# Patient Record
Sex: Female | Born: 1940
Health system: Southern US, Community
[De-identification: ages and names within clinical notes are randomized; demographics above are authoritative.]

## PROBLEM LIST (undated history)

## (undated) DIAGNOSIS — L4050 Arthropathic psoriasis, unspecified: Secondary | ICD-10-CM

## (undated) DIAGNOSIS — I5181 Takotsubo syndrome: Secondary | ICD-10-CM

## (undated) DIAGNOSIS — M51369 Other intervertebral disc degeneration, lumbar region without mention of lumbar back pain or lower extremity pain: Secondary | ICD-10-CM

## (undated) DIAGNOSIS — F419 Anxiety disorder, unspecified: Secondary | ICD-10-CM

## (undated) DIAGNOSIS — I10 Essential (primary) hypertension: Secondary | ICD-10-CM

## (undated) DIAGNOSIS — E785 Hyperlipidemia, unspecified: Secondary | ICD-10-CM

## (undated) DIAGNOSIS — F329 Major depressive disorder, single episode, unspecified: Secondary | ICD-10-CM

## (undated) DIAGNOSIS — I2510 Atherosclerotic heart disease of native coronary artery without angina pectoris: Secondary | ICD-10-CM

## (undated) DIAGNOSIS — N200 Calculus of kidney: Secondary | ICD-10-CM

## (undated) DIAGNOSIS — R7303 Prediabetes: Secondary | ICD-10-CM

## (undated) DIAGNOSIS — F32A Depression, unspecified: Secondary | ICD-10-CM

## (undated) DIAGNOSIS — F0280 Dementia in other diseases classified elsewhere without behavioral disturbance: Secondary | ICD-10-CM

## (undated) DIAGNOSIS — E119 Type 2 diabetes mellitus without complications: Secondary | ICD-10-CM

## (undated) DIAGNOSIS — M5136 Other intervertebral disc degeneration, lumbar region: Secondary | ICD-10-CM

## (undated) HISTORY — PX: APPENDECTOMY: SHX54

## (undated) HISTORY — DX: Essential (primary) hypertension: I10

## (undated) HISTORY — DX: Takotsubo syndrome: I51.81

## (undated) HISTORY — DX: Prediabetes: R73.03

## (undated) HISTORY — PX: CHOLECYSTECTOMY: SHX55

## (undated) HISTORY — DX: Calculus of kidney: N20.0

## (undated) HISTORY — DX: Atherosclerotic heart disease of native coronary artery without angina pectoris: I25.10

---

## 2002-10-15 ENCOUNTER — Ambulatory Visit (HOSPITAL_COMMUNITY): Admission: RE | Admit: 2002-10-15 | Discharge: 2002-10-15 | Payer: Self-pay | Admitting: Internal Medicine

## 2004-12-08 ENCOUNTER — Encounter: Admission: RE | Admit: 2004-12-08 | Discharge: 2004-12-08 | Payer: Self-pay | Admitting: Urology

## 2004-12-09 ENCOUNTER — Ambulatory Visit (HOSPITAL_BASED_OUTPATIENT_CLINIC_OR_DEPARTMENT_OTHER): Admission: RE | Admit: 2004-12-09 | Discharge: 2004-12-09 | Payer: Self-pay | Admitting: Urology

## 2004-12-09 ENCOUNTER — Ambulatory Visit (HOSPITAL_COMMUNITY): Admission: RE | Admit: 2004-12-09 | Discharge: 2004-12-09 | Payer: Self-pay | Admitting: Urology

## 2010-07-28 ENCOUNTER — Other Ambulatory Visit: Payer: Self-pay | Admitting: Endocrinology

## 2010-07-28 DIAGNOSIS — E042 Nontoxic multinodular goiter: Secondary | ICD-10-CM

## 2010-08-10 ENCOUNTER — Other Ambulatory Visit: Payer: Self-pay

## 2010-08-19 ENCOUNTER — Other Ambulatory Visit: Payer: Self-pay | Admitting: Endocrinology

## 2010-08-19 DIAGNOSIS — E041 Nontoxic single thyroid nodule: Secondary | ICD-10-CM

## 2010-08-26 ENCOUNTER — Other Ambulatory Visit: Payer: Self-pay | Admitting: Interventional Radiology

## 2010-08-26 ENCOUNTER — Ambulatory Visit
Admission: RE | Admit: 2010-08-26 | Discharge: 2010-08-26 | Disposition: A | Discharge status: Home / Self Care | Payer: Medicare Other | Source: Ambulatory Visit | Attending: Endocrinology | Admitting: Endocrinology

## 2010-08-26 ENCOUNTER — Other Ambulatory Visit (HOSPITAL_COMMUNITY)
Admission: RE | Admit: 2010-08-26 | Discharge: 2010-08-26 | Disposition: A | Discharge status: Home / Self Care | Payer: Medicare Other | Source: Ambulatory Visit | Attending: Interventional Radiology | Admitting: Interventional Radiology

## 2010-08-26 DIAGNOSIS — E049 Nontoxic goiter, unspecified: Secondary | ICD-10-CM | POA: Insufficient documentation

## 2010-08-26 DIAGNOSIS — E041 Nontoxic single thyroid nodule: Secondary | ICD-10-CM

## 2011-05-19 ENCOUNTER — Encounter (INDEPENDENT_AMBULATORY_CARE_PROVIDER_SITE_OTHER): Payer: Medicare Other | Admitting: Ophthalmology

## 2011-05-19 DIAGNOSIS — H35039 Hypertensive retinopathy, unspecified eye: Secondary | ICD-10-CM

## 2011-05-19 DIAGNOSIS — H33309 Unspecified retinal break, unspecified eye: Secondary | ICD-10-CM

## 2011-05-19 DIAGNOSIS — H35379 Puckering of macula, unspecified eye: Secondary | ICD-10-CM

## 2011-05-19 DIAGNOSIS — I10 Essential (primary) hypertension: Secondary | ICD-10-CM

## 2012-06-21 HISTORY — PX: BACK SURGERY: SHX140

## 2012-12-16 ENCOUNTER — Inpatient Hospital Stay (HOSPITAL_COMMUNITY)
Admission: AD | Admit: 2012-12-16 | Discharge: 2012-12-19 | DRG: 281 | Disposition: A | Discharge status: Home / Self Care | Payer: Medicare Other | Source: Other Acute Inpatient Hospital | Attending: Internal Medicine | Admitting: Internal Medicine

## 2012-12-16 ENCOUNTER — Encounter (HOSPITAL_COMMUNITY): Payer: Self-pay | Admitting: *Deleted

## 2012-12-16 DIAGNOSIS — M5137 Other intervertebral disc degeneration, lumbosacral region: Secondary | ICD-10-CM | POA: Diagnosis present

## 2012-12-16 DIAGNOSIS — Z79899 Other long term (current) drug therapy: Secondary | ICD-10-CM

## 2012-12-16 DIAGNOSIS — F3289 Other specified depressive episodes: Secondary | ICD-10-CM | POA: Diagnosis present

## 2012-12-16 DIAGNOSIS — F411 Generalized anxiety disorder: Secondary | ICD-10-CM | POA: Diagnosis present

## 2012-12-16 DIAGNOSIS — F329 Major depressive disorder, single episode, unspecified: Secondary | ICD-10-CM | POA: Diagnosis present

## 2012-12-16 DIAGNOSIS — M51379 Other intervertebral disc degeneration, lumbosacral region without mention of lumbar back pain or lower extremity pain: Secondary | ICD-10-CM | POA: Diagnosis present

## 2012-12-16 DIAGNOSIS — I2510 Atherosclerotic heart disease of native coronary artery without angina pectoris: Secondary | ICD-10-CM | POA: Diagnosis present

## 2012-12-16 DIAGNOSIS — M5136 Other intervertebral disc degeneration, lumbar region: Secondary | ICD-10-CM | POA: Diagnosis present

## 2012-12-16 DIAGNOSIS — I509 Heart failure, unspecified: Secondary | ICD-10-CM | POA: Diagnosis present

## 2012-12-16 DIAGNOSIS — N200 Calculus of kidney: Secondary | ICD-10-CM | POA: Diagnosis present

## 2012-12-16 DIAGNOSIS — I10 Essential (primary) hypertension: Secondary | ICD-10-CM | POA: Diagnosis present

## 2012-12-16 DIAGNOSIS — N1330 Unspecified hydronephrosis: Secondary | ICD-10-CM

## 2012-12-16 DIAGNOSIS — I498 Other specified cardiac arrhythmias: Secondary | ICD-10-CM | POA: Diagnosis present

## 2012-12-16 DIAGNOSIS — R0902 Hypoxemia: Secondary | ICD-10-CM | POA: Diagnosis present

## 2012-12-16 DIAGNOSIS — E785 Hyperlipidemia, unspecified: Secondary | ICD-10-CM | POA: Diagnosis present

## 2012-12-16 DIAGNOSIS — I5020 Unspecified systolic (congestive) heart failure: Secondary | ICD-10-CM | POA: Diagnosis present

## 2012-12-16 DIAGNOSIS — R7989 Other specified abnormal findings of blood chemistry: Secondary | ICD-10-CM

## 2012-12-16 DIAGNOSIS — I5181 Takotsubo syndrome: Secondary | ICD-10-CM | POA: Diagnosis present

## 2012-12-16 DIAGNOSIS — E119 Type 2 diabetes mellitus without complications: Secondary | ICD-10-CM | POA: Diagnosis present

## 2012-12-16 DIAGNOSIS — L4050 Arthropathic psoriasis, unspecified: Secondary | ICD-10-CM | POA: Diagnosis present

## 2012-12-16 DIAGNOSIS — I249 Acute ischemic heart disease, unspecified: Secondary | ICD-10-CM | POA: Diagnosis present

## 2012-12-16 DIAGNOSIS — I214 Non-ST elevation (NSTEMI) myocardial infarction: Principal | ICD-10-CM | POA: Diagnosis present

## 2012-12-16 DIAGNOSIS — I200 Unstable angina: Secondary | ICD-10-CM

## 2012-12-16 HISTORY — DX: Depression, unspecified: F32.A

## 2012-12-16 HISTORY — DX: Major depressive disorder, single episode, unspecified: F32.9

## 2012-12-16 HISTORY — DX: Type 2 diabetes mellitus without complications: E11.9

## 2012-12-16 HISTORY — DX: Anxiety disorder, unspecified: F41.9

## 2012-12-16 HISTORY — DX: Arthropathic psoriasis, unspecified: L40.50

## 2012-12-16 HISTORY — DX: Other intervertebral disc degeneration, lumbar region without mention of lumbar back pain or lower extremity pain: M51.369

## 2012-12-16 HISTORY — DX: Other intervertebral disc degeneration, lumbar region: M51.36

## 2012-12-16 HISTORY — DX: Hyperlipidemia, unspecified: E78.5

## 2012-12-16 LAB — LIPID PANEL
HDL: 57 mg/dL (ref >39)
LDL Cholesterol: 88 mg/dL (ref 0–99)
Total CHOL/HDL Ratio: 2.7 RATIO
Triglycerides: 34 mg/dL (ref <150)

## 2012-12-16 LAB — BASIC METABOLIC PANEL
BUN: 14 mg/dL (ref 6–23)
CO2: 35 mEq/L — ABNORMAL HIGH (ref 19–32)
Chloride: 106 mEq/L (ref 96–112)
GFR calc Af Amer: >90 mL/min (ref >90)
GFR calc non Af Amer: 84 mL/min — ABNORMAL LOW (ref >90)
Glucose, Bld: 104 mg/dL — ABNORMAL HIGH (ref 70–99)
Potassium: 3.9 mEq/L (ref 3.5–5.1)
Sodium: 144 mEq/L (ref 135–145)

## 2012-12-16 LAB — CBC
MCHC: 34.3 g/dL (ref 30.0–36.0)
WBC: 8.1 10*3/uL (ref 4.0–10.5)

## 2012-12-16 LAB — MRSA PCR SCREENING: MRSA by PCR: NEGATIVE

## 2012-12-16 LAB — TROPONIN I: Troponin I: 1.12 ng/mL (ref <0.30)

## 2012-12-16 MED ORDER — FENTANYL CITRATE 0.05 MG/ML IJ SOLN
12.5000 ug | INTRAMUSCULAR | Status: DC | PRN
  Administered 2012-12-16: 25 ug via INTRAVENOUS
  Administered 2012-12-16: 16:00:00 via INTRAVENOUS
  Administered 2012-12-16: 12.5 ug via INTRAVENOUS
  Filled 2012-12-16 (×3): qty 2

## 2012-12-16 MED ORDER — HYDROMORPHONE HCL PF 1 MG/ML IJ SOLN
0.5000 mg | INTRAMUSCULAR | Status: DC | PRN
  Filled 2012-12-16: qty 1

## 2012-12-16 MED ORDER — ACETAMINOPHEN 650 MG RE SUPP: 650.0000 mg | Freq: Four times a day (QID) | RECTAL | Status: DC | PRN

## 2012-12-16 MED ORDER — ASPIRIN 325 MG PO TABS
325.0000 mg | ORAL_TABLET | Freq: Every day | ORAL | Status: DC
  Administered 2012-12-17 – 2012-12-19 (×2): 325 mg via ORAL
  Filled 2012-12-16 (×3): qty 1

## 2012-12-16 MED ORDER — SODIUM CHLORIDE 0.9 % IV SOLN
INTRAVENOUS | Status: DC
  Administered 2012-12-16 – 2012-12-18 (×5): via INTRAVENOUS

## 2012-12-16 MED ORDER — HEPARIN BOLUS VIA INFUSION
3000.0000 [IU] | Freq: Once | INTRAVENOUS | Status: AC
  Administered 2012-12-16: 3000 [IU] via INTRAVENOUS
  Filled 2012-12-16: qty 3000

## 2012-12-16 MED ORDER — PAROXETINE HCL 20 MG PO TABS
20.0000 mg | ORAL_TABLET | Freq: Every day | ORAL | Status: DC
  Administered 2012-12-17 – 2012-12-19 (×3): 20 mg via ORAL
  Filled 2012-12-16 (×4): qty 1

## 2012-12-16 MED ORDER — TAMSULOSIN HCL 0.4 MG PO CAPS
0.4000 mg | ORAL_CAPSULE | Freq: Every day | ORAL | Status: DC
  Administered 2012-12-16 – 2012-12-18 (×3): 0.4 mg via ORAL
  Filled 2012-12-16 (×5): qty 1

## 2012-12-16 MED ORDER — ACETAMINOPHEN 325 MG PO TABS: 650.0000 mg | ORAL_TABLET | Freq: Four times a day (QID) | ORAL | Status: DC | PRN

## 2012-12-16 MED ORDER — INSULIN ASPART 100 UNIT/ML ~~LOC~~ SOLN: 0.0000 [IU] | SUBCUTANEOUS | Status: DC

## 2012-12-16 MED ORDER — BIOTENE DRY MOUTH MT LIQD
15.0000 mL | Freq: Two times a day (BID) | OROMUCOSAL | Status: DC
  Administered 2012-12-16 – 2012-12-19 (×4): 15 mL via OROMUCOSAL

## 2012-12-16 MED ORDER — ASPIRIN 81 MG PO CHEW
324.0000 mg | CHEWABLE_TABLET | Freq: Once | ORAL | Status: AC
  Administered 2012-12-16: 324 mg via ORAL
  Filled 2012-12-16: qty 3
  Filled 2012-12-16: qty 1

## 2012-12-16 MED ORDER — KETOROLAC TROMETHAMINE 15 MG/ML IJ SOLN
15.0000 mg | Freq: Four times a day (QID) | INTRAMUSCULAR | Status: DC | PRN
  Administered 2012-12-16: 15 mg via INTRAVENOUS
  Filled 2012-12-16: qty 1

## 2012-12-16 MED ORDER — NITROGLYCERIN IN D5W 200-5 MCG/ML-% IV SOLN
3.0000 ug/min | INTRAVENOUS | Status: DC
  Administered 2012-12-16: 5 ug/min via INTRAVENOUS
  Filled 2012-12-16: qty 250

## 2012-12-16 MED ORDER — HEPARIN (PORCINE) IN NACL 100-0.45 UNIT/ML-% IJ SOLN
700.0000 [IU]/h | INTRAMUSCULAR | Status: DC
  Administered 2012-12-16: 700 [IU]/h via INTRAVENOUS
  Filled 2012-12-16 (×3): qty 250

## 2012-12-16 NOTE — Progress Notes (Signed)
[facility] Progress Note [place] TEAM 1 - Stepdown/ICU TEAM   Jennifer [name] GNF:[id number] DOB: Nov 08, 1940 DOA: 12/16/2012 PCP: Kirstie Peri, MD  Brief narrative: 72 y.o. female who presented to the [facility] ED due to severe sharp intermittent left flank and left lower ABD pain that was worsening over 1 week. She has a history of kidneys stones and thought that she may have had a stone. While in the ED she was being evaluated and became transiently unresponsive, bradycardic, cyanotic and hypoxic.  Cardiac enzymes and an EKG were performed and the troponin was found to be elevated at 1.35. Patient denied chest pain, SOB, nausea/vomiting, or diaphoresis. A CT of the ABD was performed as well as a renal US which found obstruction and hydronephrosis of the L ureter, and a right non-obstructing renal calculus as well. [facility] was consulted and arrangements were made to transfer the patient to [facility] for Cardiology and Urology evaluations.   Assessment/Plan:  NSTEMI  [facility] Cards consulted  Mild hydronephrosis of left kidney w/ neprholithiasis  crt 0.64 at [facility] - UA remarkable for hematuria only - CT report from [facility] 6/27 notes "mild left hydronephrosis and moderate left hydroureter associated with a 0.6 x 0.4 x 0.3 com L UVJ calculus"  Type II diabetes mellitus  Diet controlled  Hyperlipidemia Lipid panel   DDD lumbar spine  Psoriatic arthritis   Code Status: FULL Family Communication: spoke w/ husband and son at bedside Disposition Plan: SDU  Consultants: Cor[facility] Cards  Procedures: none  Antibiotics: Rocephin 6/28 >>  DVT prophylaxis: IV heparin  HPI/Subjective: Pt seen for f/u visit    Objective: Blood pressure 94/50, pulse 62, temperature 98.5 F (36.9 C), temperature source Oral, resp. rate 15, height 5' (1.524 m), weight 57.1 kg (125 lb 14.1 oz), SpO2 97.00%.  Intake/Output Summary (Last 24 hours) at 12/16/12  0921 Last data filed at 12/16/12 0600  Gross per 24 hour  Intake  62.37 ml  Output      0 ml  Net  62.37 ml    Exam: F/U exam completed  Data Reviewed: Basic Metabolic Panel:  Recent Labs Lab 12/16/12 0520  NA 144  K 3.9  CL 106  CO2 35*  GLUCOSE 104*  BUN 14  CREATININE 0.72  CALCIUM 8.7   Liver Function Tests: No results found for this basename: AST, ALT, ALKPHOS, BILITOT, PROT, ALBUMIN,  in the last 168 hours No results found for this basename: LIPASE, AMYLASE,  in the last 168 hours No results found for this basename: AMMONIA,  in the last 168 hours CBC:  Recent Labs Lab 12/16/12 0520  WBC 8.1  HGB 12.2  HCT 35.6*  MCV 94.2  PLT 167   Cardiac Enzymes:  Recent Labs Lab 12/16/12 0520  TROPONINI 2.67*   CBG: No results found for this basename: GLUCAP,  in the last 168 hours  Recent Results (from the past 240 hour(s))  MRSA PCR SCREENING     Status: None   Collection Time    12/16/12  4:12 AM      Result Value Range Status   MRSA by PCR NEGATIVE  NEGATIVE Final   Comment:            The GeneXpert MRSA Assay (FDA     approved for NASAL specimens     only), is one component of a     comprehensive MRSA colonization     surveillance program. It is not  intended to diagnose MRSA     infection nor to guide or     monitor treatment for     MRSA infections.     Studies:  Recent x-ray studies have been reviewed in detail by the Attending Physician  Scheduled Meds:  Scheduled Meds: . antiseptic oral rinse  15 mL Mouth Rinse BID   Continuous Infusions: . sodium chloride 75 mL/hr at 12/16/12 0526  . heparin 700 Units/hr (12/16/12 0317)  . nitroGLYCERIN 5 mcg/min (12/16/12 0526)    Time spent on care of this patient: 25+ mins   [facility] T  [facility] Office  [phone] Pager - Text Page per Loretha Stapler as per below:  On-Call/Text Page:      [email or web address]      password [email or web address]  If 7PM-7AM, please contact  night-coverage [email or web address] Password [email or web address] 12/16/2012, 9:21 AM   LOS: 0 days

## 2012-12-16 NOTE — Progress Notes (Signed)
ANTICOAGULATION CONSULT NOTE - Initial Consult  Pharmacy Consult for Heparin Indication: chest pain/ACS  Not on File  Patient Measurements: Height: 5' (152.4 cm) Weight: 125 lb 14.1 oz (57.1 kg) IBW/kg (Calculated) : 45.5  Vital Signs: Temp: 97.1 F (36.2 C) (06/28 0100) Temp src: Oral (06/28 0100)  Labs (at [facility]):  WBC 11.4 Hgb 12.7 Hct 38.1 Plt 173  SCr 0.64  Troponin  1.35  No results found for this basename: HGB, HCT, PLT, APTT, LABPROT, INR, HEPARINUNFRC, CREATININE, CKTOTAL, CKMB, TROPONINI,  in the last 72 hours  CrCl is unknown because no creatinine reading has been taken.   Medical History: Past Medical History  Diagnosis Date  . Dysrhythmia   . Anxiety     Medications:  Hyzaar  Cardura  Vicodin  Fish Oil  MVI  Paxil  Klonopin    Assessment: 72 yo female with elevated cardiac markers for heparin  Goal of Therapy:  Heparin level 0.3-0.7 units/ml Monitor platelets by anticoagulation protocol: Yes   Plan:  Heparin 3000 units IV bolus, then 700 units/hr Check heparin level in 6 hours.  Rossy Virag, Gary Fleet 12/16/2012,2:38 AM

## 2012-12-16 NOTE — H&P (Addendum)
[facility] History and Physical  Jennifer [name]:[id number] DOB: 05/03/1941 DOA: 12/16/2012  Referring physician:  EDP at [facility] ED Dr. Franchot Erichsen PCP: Kirstie Peri, MD  Specialists: Cardiology:   Jacqulyn Ducking  Chief Complaint: Left Flank and lower ABD Pain  HPI: Jennifer [name] is a 72 y.o. female who presented to the [facility] ED due to severe sharp intermittent left flank and Left lower ABD pain that was worsening over 1 week.  She has a history of kidneys stones and thought that she may have had a stone.  While in the ED she was being evaluated and became unresponsive and bradycardic, cyanotic and hypoxic, transiently so cardiac enzymes and an EKG  were performed and the troponin was found to be elevated at ).6 then 1.2.  Patient denied having any chest pain, SOB, or nausea vomiting or diaphoresis.   [facility] was consulted and arrangements were made to transfer the patient to [facility] for Cardiology and Urology Evaluations.    A ct scan of the ABD was performed as well as a Renal Ultrasound which found obstruction and hydronephrosis of the Left Ureter, and a right non-Obstructing renal calculus as well.      Review of Systems: The patient denies anorexia, fever, chills, headaches, weight loss, vision loss, diplopia, dizziness, decreased hearing, rhinitis, hoarseness, chest pain, syncope, dyspnea on exertion, peripheral edema, balance deficits, cough, hemoptysis, nausea, vomiting, diarrhea, constipation, hematemesis, melena, hematochezia, severe indigestion/heartburn, dysuria, hematuria, incontinence, muscle weakness, suspicious skin lesions, transient blindness, difficulty walking, depression, unusual weight change, abnormal bleeding, enlarged lymph nodes, angioedema, and breast masses.    Past Medical History  Diagnosis Date  . Dysrhythmia   . Anxiety   . Diabetes mellitus     Borderline  . Hyperlipidemia   . Depression   . Anxiety    . DDD (degenerative disc disease), lumbar   . Psoriatic arthritis   . Kidney stones         HTN   Past Surgical History  Procedure Laterality Date  . Appendectomy    . Cholecystectomy      Prior to Admission medications   Not on File    Losartan  Doxazosin  Hydrocodone/APAP  Omega 3 Fatty Acids  MVI  Paroxetine  Clanazepam    Allergies:     NKDA    Social History:  has no tobacco, alcohol, and drug history on file.     Family History  Problem Relation Age of Onset  . CAD Father   . CAD Mother   . Hypertension Mother   . Hypertension Brother      X 2  . Breast cancer Mother        Physical Exam:  GEN:  Pleasant Elderly Obese Caucasian  72 y.o. female  examined  and in no acute distress; cooperative with exam Filed Vitals:   12/16/12 0100  Temp: 97.1 F (36.2 C)  TempSrc: Oral  Height: 5' (1.524 m)  Weight: 57.1 kg (125 lb 14.1 oz)   Temperature 97.1 F (36.2 C), temperature source Oral, height 5' (1.524 m), weight 57.1 kg (125 lb 14.1 oz). PSYCH: She is alert and oriented x4; does not appear anxious does not appear depressed; affect is normal HEENT: Normocephalic and Atraumatic, Mucous membranes pink; PERRLA; EOM intact; Fundi:  Benign;  No scleral icterus, Nares: Patent, Oropharynx: Clear, Fair Dentition, Neck:  FROM, no cervical lymphadenopathy nor thyromegaly or carotid bruit; no JVD; Breasts:: Not examined CHEST WALL: No tenderness CHEST:  Normal respiration, clear to auscultation bilaterally HEART: Regular rate and rhythm; no murmurs rubs or gallops BACK: No kyphosis or scoliosis; no CVA tenderness ABDOMEN: Positive Bowel Sounds, Obese, soft non-tender; no masses, no organomegaly, no pannus; no intertriginous candida. Rectal Exam: Not done EXTREMITIES: No cyanosis, clubbing or edema; no ulcerations. Genitalia: not examined PULSES: 2+ and symmetric SKIN: Normal hydration no rash or ulceration CNS: Cranial nerves 2-12 grossly intact no focal  neurologic deficit    Labs on Admission:  Basic Metabolic Panel: No results found for this basename: NA, K, CL, CO2, GLUCOSE, BUN, CREATININE, CALCIUM, MG, PHOS,  in the last 168 hours Liver Function Tests: No results found for this basename: AST, ALT, ALKPHOS, BILITOT, PROT, ALBUMIN,  in the last 168 hours No results found for this basename: LIPASE, AMYLASE,  in the last 168 hours No results found for this basename: AMMONIA,  in the last 168 hours CBC: No results found for this basename: WBC, NEUTROABS, HGB, HCT, MCV, PLT,  in the last 168 hours Cardiac Enzymes: No results found for this basename: CKTOTAL, CKMB, CKMBINDEX, TROPONINI,  in the last 168 hours  BNP (last 3 results) No results found for this basename: PROBNP,  in the last 8760 hours CBG: No results found for this basename: GLUCAP,  in the last 168 hours  Radiological Exams on Admission: No results found.   EKG: Independently reviewed.  Normal Sinus Rhythm, with T-wave inversion in Antero-Lateral leads   Assessment/Plan Principal Problem:   Acute coronary syndrome Active Problems:   NSTEMI (non-ST elevated myocardial infarction)   Hydronephrosis of left kidney   Nephrolithiasis   Type II or unspecified type diabetes mellitus without mention of complication, not stated as uncontrolled   Other and unspecified hyperlipidemia   DDD (degenerative disc disease), lumbar   Psoriatic arthritis    1.   ACS/NSTEMI-  Admitted to Stepdown Bed for Cardiac Monitoring, IV Nitro glycerin, and IV Heparin drip, Full Dose ASA therapy, and cycle Troponins.  Cardiology consulted and Evaluating at this time.   See Consult Note.  NPO in event of AM Cardiac Catherization.    2.   Hydronephrosis-  Due to Obstructing Renal Calculus, Consult Urology to see in AM 06/28 for intervention to remove calculus and obstruction.    3.   Nephrolithiasis- Unclear etiology, Calcific versus Uric Acid,   Defer to Urology evaluation for cause.  Pain  control PRN and hydration   4.   DM2- diet controlled, check HbA1C and SSI coverage added for elevated glucose levels PRN.    5.   Hyperlipidemia- Continue  Omega3 Fatty Acids  6.   DDD of Lumbar Spine with Chronic Pain- Pain Control PRN, plan for Lumbar Diskectomy and Fusion in August 2014.    7.   Psoriatic Arthritis- stable on meds, takes q 12 weekly injections of  Humira       .     8. HTN- stable on Losartan and Cardura.              Code Status:   FULL CODE Family Communication:    Son and Brother at Bedside Disposition Plan:    Return to Home on Discharge Time spent: 50 Minutes  Ron Parker [facility] Pager [phone]  If 7PM-7AM, please contact night-coverage [email or web address] Password [email or web address] 12/16/2012, 3:00 AM

## 2012-12-16 NOTE — Consult Note (Signed)
CARDIOLOGY CONSULT NOTE  Patient ID: Jennifer [name], MRN: [id number], DOB/AGE: May 22, 1941 72 y.o. Admit date: 12/16/2012 Date of Consult: 12/16/2012  Primary Physician: Kirstie Peri, MD Primary Cardiologist: Gentry Fitz  Chief Complaint: abdominal pain Reason for Consultation: +troponin in the setting of passing kidney stone  HPI: 72 y.o. female w/ PMHx significant for HTN, kidney stones who initially presented to [facility] hospital with kidney stone symptoms. Workup in the ER demonstrated an obstructing stone and per the family, outpatient monitoring was planned. However, while in the ER, nursing notes indicate that she had an episode of bradycardia, diaphoresis and reduced responsiveness. Lips turned blue. Patient does not recall any of this. This prompted further workup including drawing a troponin which was positive.  Pt is currently chest pain free and without symptoms. She reports that she is able to perform her activities of daily living without symptoms (grocery shopping etc) however she is limited by her chronic lower back pain. No cardiac history previously. No stress test in the past. No LE edema, orthopnea, PND.  EKG: sinus, LVH, PRWP, septal qs  Received aspirin (81), flomax, zofran, morphine, toradol in the ER.  CT: Mild left hydro and mild left hydroureter 6 x 4 x 3 mm UVJ calculus Atherosclerosis of aorto-iliacs  Xray: Borderline cardiomegaly athero of arch  Labs: UA +blood Cr 0.6 Glucose 113 Ca 8.2 Albumin 3.4 Normal LFTs K 3.2 Troponin 1.35 CPK 88 MB 7.3. IRN 1 LIpase 25 Plts 173  PMH: 1. Kidney stones, multiple 2. HTN 3. Chronic lower back pain  Surgical History:  1. Cholecystectomy 2. Appendectomy  Home Meds: Prior to Admission medications   Not on File  Losartan 50 hctz 25 Doxazosin 8 mg vicodin prn Fish oil Vitamins Paroxetine 40 Xanax prn  Inpatient Medications:    Allergies: NKDA  History   Social History  . Marital Status:  Single    Spouse Name: N/A    Number of Children: N/A  . Years of Education: N/A   Occupational History  . Not on file.   Social History Main Topics  . Smoking status: Not on file  . Smokeless tobacco: Not on file  . Alcohol Use: Not on file  . Drug Use: Not on file  . Sexually Active: Not on file   Other Topics Concern  . Not on file   Social History Narrative  . No narrative on file    FH: M and F with MIs in 60s   Review of Systems: General: negative for chills, fever, night sweats or weight changes.  Cardiovascular: see HPI Dermatological: negative for rash Respiratory: negative for cough or wheezing Urologic: negative for hematuria Abdominal: negative for nausea, vomiting, diarrhea, bright red blood per rectum, melena, or hematemesis Neurologic: negative for visual changes, syncope, or dizziness All other systems reviewed and are otherwise negative except as noted above.  Labs: No results found for this basename: CKTOTAL, CKMB, TROPONINI,  in the last 72 hours No results found for this basename: WBC, HGB, HCT, MCV, PLT   No results found for this basename: NA, K, CL, CO2, BUN, CREATININE, CALCIUM, LABALBU, PROT, BILITOT, ALKPHOS, ALT, AST, GLUCOSE,  in the last 168 hours No results found for this basename: CHOL, HDL, LDLCALC, TRIG   No results found for this basename: DDIMER    Radiology/Studies:  No results found.  EKG: see HPI  Physical Exam: There were no vitals taken for this visit. General: Well developed, well nourished, in no acute distress. Head: Normocephalic, atraumatic, sclera non-icteric,  no xanthomas, nares are without discharge.  Neck: Supple. Negative for carotid bruits. JVD not elevated. Lungs: Clear bilaterally to auscultation without wheezes, rales, or rhonchi. Breathing is unlabored. Heart: RRR with S1 S2. No murmurs, rubs, or gallops appreciated. Abdomen: Soft, non-tender, non-distended with normoactive bowel sounds. No hepatomegaly. No  rebound/guarding. No obvious abdominal masses. Msk:  Strength and tone appear normal for age. Extremities: No clubbing or cyanosis. No edema.  Distal pedal pulses are 2+ and equal bilaterally. Neuro: Alert and oriented X 3. Moves all extremities spontaneously. Psych:  Responds to questions appropriately with a normal affect.   Problem List 1. Syncopal event? Details unclear 2. Elevated troponin 3. Atherosclerosis by CT and chest xray 4. LVH by EKG 5. HTN 6. Obstructing kidney stone  Assessment and Plan:  72 y.o. female w/ PMHx significant for HTN, kidney stones who initially presented to [facility] hospital with kidney stone symptoms but suffered some sort of syncope/unresponsive/bradycardic event while in the ER resulting in an elevated troponin.   Elevated troponin could be due to several etiologies including "typical ACS" of atheroembolic coronary disease, supply demand mismatch from vagal hypotensive event, or takotsubos cardiomyopathy. Risk factors include long standing hypertension, LVH on EKG, and atherosclerotic disease seen on abdominal CT.  Currently chest pain free and comfortable.  At this time, treatment for ACS is most prudent with aspirin, heparin, blood pressure control and statin. Serial troponins and telemetry. Continue ARB and HCTZ. Doxazosin doesn;t have much cardio-protective effect and would consider stopping this medication in favor of others unless it is needed for kidney stones.  Echo ordered to evaluate LVH as well as for wall motion abnormalities.  Would maintain NPO in case cath is planned in the AM. Factors that would influence urgency of cath decision include recurrent symptoms, significantly elevated troponin, wall motion abnormalities on echo.  Summary of Recommendations: 1. Serial troponins 2. Heparin gtt (ordered), aspirin 325 x 1 now (ordered), 3. Aspirin 81 mg, continue home losartan/hctz. Ambilvalent about doxazosin (see above) 4. NPO for possible  procedure 5. Echo (ordered)  Thank you for this consult. [facility] will follow up in the AM. Please call with questions.  Signed, Jahden Schara C. MD 12/16/2012, 1:02 AM

## 2012-12-16 NOTE — Progress Notes (Signed)
Notified Dr. Adolm Joseph of critical trop 2.67. No new orders at this time.

## 2012-12-16 NOTE — Progress Notes (Addendum)
ANTICOAGULATION CONSULT NOTE - Follow Up  Pharmacy Consult for Heparin Indication: chest pain/ACS  Not on File  Patient Measurements: Height: 5' (152.4 cm) Weight: 125 lb 14.1 oz (57.1 kg) IBW/kg (Calculated) : 45.5  Vital Signs: Temp: 98.5 F (36.9 C) (06/28 0759) Temp src: Oral (06/28 0759) BP: 96/46 mmHg (06/28 0930) Pulse Rate: 69 (06/28 0930)  Labs (at [facility]):  WBC 11.4 Hgb 12.7 Hct 38.1 Plt 173  SCr 0.64  Troponin  1.35 > 2.67(here) now 1.79   Recent Labs  12/16/12 0520 12/16/12 0912  HGB 12.2  --   HCT 35.6*  --   PLT 167  --   HEPARINUNFRC  --  0.59  CREATININE 0.72  --   TROPONINI 2.67* 1.79*   Estimated Creatinine Clearance: 50.3 ml/min (by C-G formula based on Cr of 0.72).  Medical History: Past Medical History  Diagnosis Date  . Anxiety   . Diabetes mellitus     Borderline  . Hyperlipidemia   . Anxiety   . DDD (degenerative disc disease), lumbar   . Psoriatic arthritis   . Kidney stones   . Hypertension   . Depression    Medications:  Hyzaar  Cardura  Vicodin  Fish Oil  MVI  Paxil  Klonopin    Assessment: 72 yo female with elevated cardiac markers for heparin while cardiac work up in progress.  Her CBC is stable this morning as is her platelets.  No noted bleeding complications and her repeat heparin level is therapeutic at 0.59 on IV heparin rate of 700 units/hr.    Goal of Therapy:  Heparin level 0.3-0.7 units/ml Monitor platelets by anticoagulation protocol: Yes   Plan:  1.  Continue her IV Heparin at 700 units/hr 2.  Check heparin level in the morning. 3.  Monitor for s/s of bleeding.  Nadara Mustard, PharmD., MS Clinical Pharmacist Pager:  [phone] Thank you for allowing pharmacy to be part of this patients care team. 12/16/2012,10:32 AM

## 2012-12-16 NOTE — Progress Notes (Signed)
  Echocardiogram 2D Echocardiogram has been performed.  Jennifer [name] FRANCES 12/16/2012, 12:25 PM

## 2012-12-16 NOTE — Progress Notes (Signed)
Nutrition Brief Note  Malnutrition Screening Tool result is inaccurate.  Please consult if nutrition needs are identified.  Joaquin Courts, RD, LDN, CNSC Pager [phone] After Hours Pager [phone]

## 2012-12-17 DIAGNOSIS — I214 Non-ST elevation (NSTEMI) myocardial infarction: Secondary | ICD-10-CM

## 2012-12-17 LAB — CBC
HCT: 34.4 % — ABNORMAL LOW (ref 36.0–46.0)
MCH: 32.7 pg (ref 26.0–34.0)
MCV: 96.1 fL (ref 78.0–100.0)
Platelets: 145 10*3/uL — ABNORMAL LOW (ref 150–400)
RBC: 3.58 MIL/uL — ABNORMAL LOW (ref 3.87–5.11)
WBC: 8.3 10*3/uL (ref 4.0–10.5)

## 2012-12-17 LAB — GLUCOSE, CAPILLARY
Glucose-Capillary: 99 mg/dL (ref 70–99)
Glucose-Capillary: 129 mg/dL — ABNORMAL HIGH (ref 70–99)
Glucose-Capillary: 107 mg/dL — ABNORMAL HIGH (ref 70–99)
Glucose-Capillary: 104 mg/dL — ABNORMAL HIGH (ref 70–99)

## 2012-12-17 LAB — COMPREHENSIVE METABOLIC PANEL
BUN: 12 mg/dL (ref 6–23)
CO2: 33 mEq/L — ABNORMAL HIGH (ref 19–32)
Calcium: 8.4 mg/dL (ref 8.4–10.5)
Chloride: 107 mEq/L (ref 96–112)
Creatinine, Ser: 0.63 mg/dL (ref 0.50–1.10)
GFR calc Af Amer: >90 mL/min (ref >90)
GFR calc non Af Amer: 87 mL/min — ABNORMAL LOW (ref >90)
Total Bilirubin: 0.3 mg/dL (ref 0.3–1.2)

## 2012-12-17 LAB — HEMOGLOBIN A1C: Hgb A1c MFr Bld: 5.7 % — ABNORMAL HIGH (ref <5.7)

## 2012-12-17 MED ORDER — INSULIN ASPART 100 UNIT/ML ~~LOC~~ SOLN
0.0000 [IU] | Freq: Three times a day (TID) | SUBCUTANEOUS | Status: DC
  Administered 2012-12-17: 1 [IU] via SUBCUTANEOUS

## 2012-12-17 NOTE — Progress Notes (Signed)
Patient ID: Jennifer [name], female   DOB: [birth year], 72 y.o.   MRN: [id number] Patient is stable this morning. He clearly had a myocardial infarction based on troponins. Echocardiogram shows an EF of 35% with akinesia of the basal mid anterior septal [name] and moderate hypokinesia the entire apical myocardium. She is has moderate hypokinesis of distal lateral myocardium. Very concerning for three-vessel disease. No hematuria on heparin. No further discomfort. Plan on cardiac catheterization with coronary angiography only tomorrow. Indications, risks, and potential benefit and outcome discussed with patient. She agrees to proceed.

## 2012-12-17 NOTE — Progress Notes (Signed)
ANTICOAGULATION CONSULT NOTE - Follow Up  Pharmacy Consult for Heparin Indication: chest pain/ACS  Patient Measurements: Height: 5' (152.4 cm) Weight: 125 lb 14.1 oz (57.1 kg) IBW/kg (Calculated) : 45.5  Vital Signs: Temp: 98.4 F (36.9 C) (06/29 0811) Temp src: Oral (06/29 0811) BP: 99/43 mmHg (06/29 0813) Pulse Rate: 74 (06/29 0811)   Recent Labs  12/16/12 0520 12/16/12 0912 12/16/12 1624 12/17/12 0539  HGB 12.2  --   --  11.7*  HCT 35.6*  --   --  34.4*  PLT 167  --   --  145*  HEPARINUNFRC  --  0.59  --  0.47  CREATININE 0.72  --   --  0.63  TROPONINI 2.67* 1.79* 1.12*  --    Estimated Creatinine Clearance: 50.3 ml/min (by C-G formula based on Cr of 0.63).  Medical History: Past Medical History  Diagnosis Date  . Anxiety   . Diabetes mellitus     Borderline  . Hyperlipidemia   . Anxiety   . DDD (degenerative disc disease), lumbar   . Psoriatic arthritis   . Kidney stones   . Hypertension   . Depression    Medications:  Hyzaar  Cardura  Vicodin  Fish Oil  MVI  Paxil  Klonopin    Assessment: 72 yo female with elevated cardiac markers for heparin while cardiac work up in progress.  Per Cards, she indeed had an MI and will need cardiac cath for further evaluation.  She was started on IV heparin and her Heparin level this morning is 0.47 on IV heparin rate of 700 units/hr.  Her CBC is stable this morning as is her platelets.  No noted bleeding complications.   Goal of Therapy:  Heparin level 0.3-0.7 units/ml Monitor platelets by anticoagulation protocol: Yes   Plan:  1.  Continue her IV Heparin at 700 units/hr 2.  Check heparin level in the morning. 3.  Monitor for s/s of bleeding.  Nadara Mustard, PharmD., MS Clinical Pharmacist Pager:  [phone] Thank you for allowing pharmacy to be part of this patients care team. 12/17/2012,11:33 AM

## 2012-12-17 NOTE — Progress Notes (Signed)
[facility] Progress Note [place] TEAM 1 - Stepdown/ICU TEAM   LATERA MCLIN MVH:[id number] DOB: Jan 01, 1941 DOA: 12/16/2012 PCP: Kirstie Peri, MD  Brief narrative: 72 y.o. female who presented to the [facility] ED due to severe sharp intermittent left flank and left lower ABD pain that was worsening over 1 week. She has a history of kidneys stones and thought that she may have had a stone. While in the ED she was being evaluated and became transiently unresponsive, bradycardic, cyanotic and hypoxic.  Cardiac enzymes and an EKG were performed and the troponin was found to be elevated at 1.35. Patient denied chest pain, SOB, nausea/vomiting, or diaphoresis. A CT of the ABD was performed as well as a renal US which found obstruction and hydronephrosis of the L ureter, and a right non-obstructing renal calculus as well. [facility] was consulted and arrangements were made to transfer the patient to [facility] for Cardiology and Urology evaluations.   Assessment/Plan:  NSTEMI  [place] Cards consulted - for cardiac cath in AM   Newly diagnosed systolic CHF Possibly ischemic - for cardiac cath in AM - consider initiating ACE/ARB after cath (but not prior to dye load) - BB dosing limited by hypotension at present - exercise care w/ volume   Mild hydronephrosis of left kidney w/ neprholithiasis  crt 0.64 at [facility] - UA remarkable for hematuria only - CT report from [facility] 6/27 notes "mild left hydronephrosis and moderate left hydroureter associated with a 0.6 x 0.4 x 0.3 com L UVJ calculus" - crt stable - no pain today, but had signif pain last PM - will ask Urology to see after coronary status is clarified - follow daily BMET   Type II diabetes mellitus  Diet controlled at home - CBG well controlled - A1c 5.7  Hyperlipidemia LDL 88 w/ HDL 57 - consider initiating statin depending upon results of cath  DDD lumbar spine Patient informs me that she is scheduled  to have spinal surgery per Dr. Trey Sailors in approximately 2 months - I have informed her that this will most likely need to be postponed depending upon the results of her cardiac cath tomorrow - she is not currently experiencing symptoms to suggest acute emergent spinal stenosis  Psoriatic arthritis Well compensated at present  Code Status: FULL Family Communication: spoke w/ husband and son at bedside Disposition Plan: SDU  Consultants: [place] [facility]  Procedures: 6/28 - TTE - EF 30% to 35% - akinesis of the basal-midanteroseptal myocardium - moderate hypokinesis of the entire apical myocardium - moderate hypokinesis of the distallateral myocardium - grade 1 diastolic dysfunction  Antibiotics: Rocephin 6/28 >>  DVT prophylaxis: IV heparin  HPI/Subjective: The patient reports that she had significant left flank pain consistent with previous kidney stones last night.  At this point the pain has resolved.  She denies chest pain or shortness of breath.  Objective: Blood pressure 99/43, pulse 74, temperature 98.4 F (36.9 C), temperature source Oral, resp. rate 12, height 5' (1.524 m), weight 57.1 kg (125 lb 14.1 oz), SpO2 95.00%.  Intake/Output Summary (Last 24 hours) at 12/17/12 0826 Last data filed at 12/17/12 0700  Gross per 24 hour  Intake 2220.54 ml  Output   1250 ml  Net 970.54 ml    Exam: General: No acute respiratory distress - alert and oriented Lungs: Clear to auscultation bilaterally without wheezes or crackles Cardiovascular: Regular rate and rhythm without murmur gallop or rub Abdomen: Nontender, nondistended, soft, bowel sounds positive, no  rebound, no ascites, no appreciable mass Extremities: No significant cyanosis, clubbing, or edema bilateral lower extremities   Data Reviewed: Basic Metabolic Panel:  Recent Labs Lab 12/16/12 0520 12/17/12 0539  NA 144 142  K 3.9 4.1  CL 106 107  CO2 35* 33*  GLUCOSE 104* 97  BUN 14 12  CREATININE 0.72 0.63   CALCIUM 8.7 8.4   Liver Function Tests:  Recent Labs Lab 12/17/12 0539  AST 19  ALT 10  ALKPHOS 48  BILITOT 0.3  PROT 5.2*  ALBUMIN 2.8*   CBC:  Recent Labs Lab 12/16/12 0520 12/17/12 0539  WBC 8.1 8.3  HGB 12.2 11.7*  HCT 35.6* 34.4*  MCV 94.2 96.1  PLT 167 145*   Cardiac Enzymes:  Recent Labs Lab 12/16/12 0520 12/16/12 0912 12/16/12 1624  TROPONINI 2.67* 1.79* 1.12*   CBG:  Recent Labs Lab 12/16/12 1934 12/16/12 2344 12/17/12 0406 12/17/12 0813  GLUCAP 111* 88 93 99    Recent Results (from the past 240 hour(s))  MRSA PCR SCREENING     Status: None   Collection Time    12/16/12  4:12 AM      Result Value Range Status   MRSA by PCR NEGATIVE  NEGATIVE Final   Comment:            The GeneXpert MRSA Assay (FDA     approved for NASAL specimens     only), is one component of a     comprehensive MRSA colonization     surveillance program. It is not     intended to diagnose MRSA     infection nor to guide or     monitor treatment for     MRSA infections.     Studies:  Recent x-ray studies have been reviewed in detail by the Attending Physician  Scheduled Meds:  Scheduled Meds: . antiseptic oral rinse  15 mL Mouth Rinse BID  . aspirin  325 mg Oral Daily  . insulin aspart  0-9 Units Subcutaneous Q4H  . PARoxetine  20 mg Oral Daily  . tamsulosin  0.4 mg Oral QHS    Time spent on care of this patient: 35 mins   [facility] T  [facility] Office  [phone] Pager - Text Page per Loretha Stapler as per below:  On-Call/Text Page:      [email or web address]      password [email or web address]  If 7PM-7AM, please contact night-coverage [email or web address] Password [email or web address] 12/17/2012, 8:26 AM   LOS: 1 day

## 2012-12-18 ENCOUNTER — Encounter (HOSPITAL_COMMUNITY)
Admission: AD | Disposition: A | Discharge status: Home / Self Care | Payer: Self-pay | Source: Other Acute Inpatient Hospital | Attending: Internal Medicine

## 2012-12-18 DIAGNOSIS — I2510 Atherosclerotic heart disease of native coronary artery without angina pectoris: Secondary | ICD-10-CM

## 2012-12-18 DIAGNOSIS — I5181 Takotsubo syndrome: Secondary | ICD-10-CM

## 2012-12-18 HISTORY — PX: LEFT HEART CATHETERIZATION WITH CORONARY ANGIOGRAM: SHX5451

## 2012-12-18 LAB — CBC
HCT: 35.8 % — ABNORMAL LOW (ref 36.0–46.0)
Hemoglobin: 12.1 g/dL (ref 12.0–15.0)
MCH: 32.6 pg (ref 26.0–34.0)
MCV: 96.5 fL (ref 78.0–100.0)
Platelets: 143 10*3/uL — ABNORMAL LOW (ref 150–400)
RBC: 3.6 MIL/uL — ABNORMAL LOW (ref 3.87–5.11)
RBC: 3.71 MIL/uL — ABNORMAL LOW (ref 3.87–5.11)
WBC: 7.7 10*3/uL (ref 4.0–10.5)

## 2012-12-18 LAB — GLUCOSE, CAPILLARY
Glucose-Capillary: 102 mg/dL — ABNORMAL HIGH (ref 70–99)
Glucose-Capillary: 120 mg/dL — ABNORMAL HIGH (ref 70–99)

## 2012-12-18 LAB — BASIC METABOLIC PANEL
CO2: 34 mEq/L — ABNORMAL HIGH (ref 19–32)
Chloride: 106 mEq/L (ref 96–112)
Potassium: 3.7 mEq/L (ref 3.5–5.1)
Sodium: 142 mEq/L (ref 135–145)

## 2012-12-18 SURGERY — LEFT HEART CATHETERIZATION WITH CORONARY ANGIOGRAM
Anesthesia: LOCAL

## 2012-12-18 MED ORDER — LOSARTAN POTASSIUM 25 MG PO TABS
25.0000 mg | ORAL_TABLET | Freq: Every day | ORAL | Status: DC
  Administered 2012-12-18 – 2012-12-19 (×2): 25 mg via ORAL
  Filled 2012-12-18 (×2): qty 1

## 2012-12-18 MED ORDER — METOPROLOL TARTRATE 12.5 MG HALF TABLET
12.5000 mg | ORAL_TABLET | Freq: Two times a day (BID) | ORAL | Status: DC
  Administered 2012-12-18 – 2012-12-19 (×3): 12.5 mg via ORAL
  Filled 2012-12-18 (×5): qty 1

## 2012-12-18 MED ORDER — CLONAZEPAM 0.5 MG PO TABS
0.5000 mg | ORAL_TABLET | Freq: Every day | ORAL | Status: DC
  Administered 2012-12-18 (×2): 0.5 mg via ORAL
  Filled 2012-12-18 (×2): qty 1

## 2012-12-18 MED ORDER — MIDAZOLAM HCL 2 MG/2ML IJ SOLN
INTRAMUSCULAR | Status: AC
  Filled 2012-12-18: qty 2

## 2012-12-18 MED ORDER — FENTANYL CITRATE 0.05 MG/ML IJ SOLN
INTRAMUSCULAR | Status: AC
  Filled 2012-12-18: qty 2

## 2012-12-18 MED ORDER — HEPARIN SODIUM (PORCINE) 5000 UNIT/ML IJ SOLN
5000.0000 [IU] | Freq: Three times a day (TID) | INTRAMUSCULAR | Status: DC
  Filled 2012-12-18 (×4): qty 1

## 2012-12-18 MED ORDER — VERAPAMIL HCL 2.5 MG/ML IV SOLN
INTRAVENOUS | Status: AC
  Filled 2012-12-18: qty 2

## 2012-12-18 MED ORDER — SODIUM CHLORIDE 0.9 % IV SOLN: 1.0000 mL/kg/h | INTRAVENOUS | Status: AC

## 2012-12-18 MED ORDER — HEPARIN SODIUM (PORCINE) 1000 UNIT/ML IJ SOLN
INTRAMUSCULAR | Status: AC
  Filled 2012-12-18: qty 1

## 2012-12-18 MED ORDER — LIDOCAINE HCL (PF) 1 % IJ SOLN
INTRAMUSCULAR | Status: AC
  Filled 2012-12-18: qty 30

## 2012-12-18 MED ORDER — HEPARIN (PORCINE) IN NACL 2-0.9 UNIT/ML-% IJ SOLN
INTRAMUSCULAR | Status: AC
  Filled 2012-12-18: qty 1500

## 2012-12-18 MED ORDER — NITROGLYCERIN 0.2 MG/ML ON CALL CATH LAB
INTRAVENOUS | Status: AC
  Filled 2012-12-18: qty 1

## 2012-12-18 MED ORDER — ASPIRIN 81 MG PO CHEW
324.0000 mg | CHEWABLE_TABLET | Freq: Once | ORAL | Status: AC
  Administered 2012-12-18: 324 mg via ORAL
  Filled 2012-12-18: qty 4

## 2012-12-18 NOTE — H&P (View-Only) (Signed)
 TELEMETRY: Reviewed telemetry pt in NSR: Filed Vitals:   12/18/12 0000 12/18/12 0010 12/18/12 0400 12/18/12 0740  BP: 101/33 127/71 119/40 146/65  Pulse: 84 80 70 73  Temp: 99.2 F (37.3 C)  98.3 F (36.8 C) 98.3 F (36.8 C)  TempSrc: Oral  Oral Oral  Resp: 26 18 12 14  Height:      Weight:      SpO2: 96% 100% 97% 96%    Intake/Output Summary (Last 24 hours) at 12/18/12 0857 Last data filed at 12/18/12 0800  Gross per 24 hour  Intake 2184.6 ml  Output   1100 ml  Net 1084.6 ml    SUBJECTIVE Denies any chest pain or SOB. No N/V.  LABS: Basic Metabolic Panel:  Recent Labs  12/17/12 0539 12/18/12 0500  NA 142 142  K 4.1 3.7  CL 107 106  CO2 33* 34*  GLUCOSE 97 101*  BUN 12 8  CREATININE 0.63 0.60  CALCIUM 8.4 8.8   Liver Function Tests:  Recent Labs  12/17/12 0539  AST 19  ALT 10  ALKPHOS 48  BILITOT 0.3  PROT 5.2*  ALBUMIN 2.8*   CBC:  Recent Labs  12/17/12 0539 12/18/12 0500  WBC 8.3 7.7  HGB 11.7* 11.6*  HCT 34.4* 34.4*  MCV 96.1 95.6  PLT 145* 143*   Cardiac Enzymes:  Recent Labs  12/16/12 0520 12/16/12 0912 12/16/12 1624  TROPONINI 2.67* 1.79* 1.12*   Hemoglobin A1C:  Recent Labs  12/16/12 0520  HGBA1C 5.7*   Fasting Lipid Panel:  Recent Labs  12/16/12 0520  CHOL 152  HDL 57  LDLCALC 88  TRIG 34  CHOLHDL 2.7   Radiology/Studies:  No results found.  Ecg: NSR with deep T wave inversion anterior and lateral leads.  Echo: Study Conclusions  - Left ventricle: The cavity size was normal. Systolic function was moderately to severely reduced. The estimated ejection fraction was in the range of 30% to 35%. There is akinesis of the basal-midanteroseptal myocardium. There is moderate hypokinesis of the entireapical myocardium. There is moderate hypokinesis of the distallateral myocardium. There was an increased relative contribution of atrial contraction to ventricular filling. Doppler parameters are consistent  with abnormal left ventricular relaxation (grade 1 diastolic dysfunction). - Mitral valve: Mild regurgitation.  PHYSICAL EXAM General: Well developed, well nourished, in no acute distress. Head: Normocephalic, atraumatic, sclera non-icteric, no xanthomas, nares are without discharge. Neck: Negative for carotid bruits. JVD not elevated. Lungs: Clear bilaterally to auscultation without wheezes, rales, or rhonchi. Breathing is unlabored. Heart: RRR S1 S2 without murmurs, rubs, or gallops.  Abdomen: Soft, non-tender, non-distended with normoactive bowel sounds. No hepatomegaly. No rebound/guarding. No obvious abdominal masses. Msk:  Strength and tone appears normal for age. Extremities: No clubbing, cyanosis or edema.  Distal pedal pulses are 2+ and equal bilaterally. Neuro: Alert and oriented X 3. Moves all extremities spontaneously. Psych:  Responds to questions appropriately with a normal affect.  ASSESSMENT AND PLAN: 1. NSTEMI. Differential includes severe CAD versus Takotsubo's syndrome. (No real chest pain). Agree with current management. Will proceed with cardiac cath today +/- PCI. On heparin and Ntg. Start low dose metoprolol. 2. Renal calculi 3. HTN. 4. DM type 2.  Principal Problem:   Acute coronary syndrome Active Problems:   NSTEMI (non-ST elevated myocardial infarction)   Hydronephrosis of left kidney   Nephrolithiasis   Type II or unspecified type diabetes mellitus without mention of complication, not stated as uncontrolled   Other and unspecified   hyperlipidemia   DDD (degenerative disc disease), lumbar   Psoriatic arthritis    Signed, Theoplis Garciagarcia MD,FACC 12/18/2012 9:02 AM    

## 2012-12-18 NOTE — Progress Notes (Signed)
ANTICOAGULATION CONSULT NOTE - Follow Up  Pharmacy Consult for Heparin Indication: chest pain/ACS  Patient Measurements: Height: 5' (152.4 cm) Weight: 125 lb 14.1 oz (57.1 kg) IBW/kg (Calculated) : 45.5  Vital Signs: Temp: 98.3 F (36.8 C) (06/30 0740) Temp src: Oral (06/30 0740) BP: 146/65 mmHg (06/30 0740) Pulse Rate: 73 (06/30 0740)   Recent Labs  12/16/12 0520 12/16/12 0912 12/16/12 1624 12/17/12 0539 12/18/12 0500  HGB 12.2  --   --  11.7* 11.6*  HCT 35.6*  --   --  34.4* 34.4*  PLT 167  --   --  145* 143*  HEPARINUNFRC  --  0.59  --  0.47 0.28*  CREATININE 0.72  --   --  0.63 0.60  TROPONINI 2.67* 1.79* 1.12*  --   --    Estimated Creatinine Clearance: 50.3 ml/min (by C-G formula based on Cr of 0.6).  Medical History: Past Medical History  Diagnosis Date  . Anxiety   . Diabetes mellitus     Borderline  . Hyperlipidemia   . Anxiety   . DDD (degenerative disc disease), lumbar   . Psoriatic arthritis   . Kidney stones   . Hypertension   . Depression    Medications:  Hyzaar  Cardura  Vicodin  Fish Oil  MVI  Paxil  Klonopin    Assessment: 72 yo female with elevated cardiac markers for heparin while cardiac work up in progress, cath planned today.  She was started on IV heparin drip 700 uts/hr HL 0.28 but cath planned so no changes this AM.  Her CBC is stable this morning as is her platelets.  No noted bleeding complications.   Goal of Therapy:  Heparin level 0.3-0.7 units/ml Monitor platelets by anticoagulation protocol: Yes   Plan:  1.  Continue her IV Heparin at 700 units/hr F/u after cath.  Leota Sauers Pharm.D. CPP, BCPS Clinical Pharmacist [phone] 12/18/2012 9:54 AM

## 2012-12-18 NOTE — Interval H&P Note (Signed)
History and Physical Interval Note:  12/18/2012 1:53 PM  LAQUETA BONAVENTURA  has presented today for surgery, with the diagnosis of cp  The various methods of treatment have been discussed with the patient and family. After consideration of risks, benefits and other options for treatment, the patient has consented to  Procedure(s): LEFT HEART CATHETERIZATION WITH CORONARY ANGIOGRAM (N/A) as a surgical intervention .  The patient's history has been reviewed, patient examined, no change in status, stable for surgery.  I have reviewed the patient's chart and labs.  Questions were answered to the patient's satisfaction.    Cath Lab Visit (complete for each Cath Lab visit)  Clinical Evaluation Leading to the Procedure:   ACS: yes  Non-ACS:    Anginal Classification: CCS IV  Anti-ischemic medical therapy: Minimal Therapy (1 class of medications)  Non-Invasive Test Results: No non-invasive testing performed  Prior CABG: No previous CABG       Theron Arista [facility] 12/18/2012 1:53 PM

## 2012-12-18 NOTE — Progress Notes (Signed)
[facility] Progress Note Jennifer [name] TEAM 1 - Stepdown/ICU TEAM   Jennifer [name] WUJ:[id number] DOB: 10-Oct-1940 DOA: 12/16/2012 PCP: Kirstie Peri, MD  Brief narrative: 72 y.o. female who presented to the [facility] ED due to severe sharp intermittent left flank and left lower ABD pain that was worsening over 1 week. She has a history of kidneys stones and thought that she may have had a stone. While in the ED she was being evaluated and became transiently unresponsive, bradycardic, cyanotic and hypoxic.  Cardiac enzymes and an EKG were performed and the troponin was found to be elevated at 1.35. Patient denied chest pain, SOB, nausea/vomiting, or diaphoresis. A CT of the ABD was performed as well as a renal US which found obstruction and hydronephrosis of the L ureter, and a right non-obstructing renal calculus as well. [facility] was consulted and arrangements were made to transfer the patient to [facility] for Cardiology and Urology evaluations.   Assessment/Plan:  NSTEMI  [place] Cards consulted - cardiac cath 6/30 c/w Takatsubo Syndrome   Newly diagnosed systolic CHF Due to [facility] and expected to recover full LV fxn after medical Rx - defer med adjustments to Cardiology  Mild hydronephrosis of left kidney w/ neprholithiasis  crt 0.64 at [facility] - UA remarkable for hematuria only - CT report from [facility] 6/27 notes "mild left hydronephrosis and moderate left hydroureter associated with a 0.6 x 0.4 x 0.3 com L UVJ calculus" - crt stable - no pain today - no hematuria - decision on inpatient urology evaluation versus outpatient referral will depend upon suitability for discharge from cardiac standpoint  Type II diabetes mellitus  Diet controlled at home - CBG well controlled - A1c 5.7  Hyperlipidemia LDL 88 w/ HDL 57 - no strong indication for medical management at the present time - diet adjustment suggested as first-line treatment option  DDD lumbar  spine Patient informs me that she is scheduled to have spinal surgery per Dr. Trey Sailors in approximately 2 months - we'll ask cardiology to clarify when the patient will be suited for surgery  Psoriatic arthritis Well compensated at present  Code Status: FULL Family Communication: spoke w/ husband and sons at bedside Disposition Plan: tele unit post cath  Consultants: [place] [facility]  Procedures: 6/28 - TTE - EF 30% to 35% - akinesis of the basal-midanteroseptal myocardium - moderate hypokinesis of the entire apical myocardium - moderate hypokinesis of the distallateral myocardium - grade 1 diastolic dysfunction  Antibiotics: Rocephin 6/28 >>  DVT prophylaxis: IV heparin  HPI/Subjective: No further flank pain in past 24 hours. Has not had any CP or other classic ischemic sx's.  Denies sob or signif hematuria.  Objective: Blood pressure 129/48, pulse 57, temperature 98.6 F (37 C), temperature source Oral, resp. rate 13, height 5' (1.524 m), weight 57.1 kg (125 lb 14.1 oz), SpO2 98.00%.  Intake/Output Summary (Last 24 hours) at 12/18/12 1432 Last data filed at 12/18/12 0800  Gross per 24 hour  Intake 1512.2 ml  Output   1100 ml  Net  412.2 ml    Exam: General: No acute respiratory distress - alert and oriented Lungs: Clear to auscultation bilaterally without wheezes or crackles Cardiovascular: Regular rate and rhythm without murmur gallop or rub Abdomen: Nontender, nondistended, soft, bowel sounds positive, no rebound, no ascites, no appreciable mass Extremities: No significant cyanosis, clubbing, or edema bilateral lower extremities   Data Reviewed: Basic Metabolic Panel:  Recent Labs Lab 12/16/12 0520 12/17/12 0539 12/18/12 0500  NA 144 142 142  K 3.9 4.1 3.7  CL 106 107 106  CO2 35* 33* 34*  GLUCOSE 104* 97 101*  BUN 14 12 8   CREATININE 0.72 0.63 0.60  CALCIUM 8.7 8.4 8.8   Liver Function Tests:  Recent Labs Lab 12/17/12 0539  AST 19  ALT 10   ALKPHOS 48  BILITOT 0.3  PROT 5.2*  ALBUMIN 2.8*   CBC:  Recent Labs Lab 12/16/12 0520 12/17/12 0539 12/18/12 0500  WBC 8.1 8.3 7.7  HGB 12.2 11.7* 11.6*  HCT 35.6* 34.4* 34.4*  MCV 94.2 96.1 95.6  PLT 167 145* 143*   Cardiac Enzymes:  Recent Labs Lab 12/16/12 0520 12/16/12 0912 12/16/12 1624  TROPONINI 2.67* 1.79* 1.12*   CBG:  Recent Labs Lab 12/17/12 1244 12/17/12 1709 12/17/12 2145 12/18/12 0740 12/18/12 1123  GLUCAP 129* 107* 104* 99 102*    Recent Results (from the past 240 hour(s))  MRSA PCR SCREENING     Status: None   Collection Time    12/16/12  4:12 AM      Result Value Range Status   MRSA by PCR NEGATIVE  NEGATIVE Final   Comment:            The GeneXpert MRSA Assay (FDA     approved for NASAL specimens     only), is one component of a     comprehensive MRSA colonization     surveillance program. It is not     intended to diagnose MRSA     infection nor to guide or     monitor treatment for     MRSA infections.     Studies:  Recent x-ray studies have been reviewed in detail by the Attending Physician  Scheduled Meds:  Scheduled Meds: . [facility] HOLD] antiseptic oral rinse  15 mL Mouth Rinse BID  . [facility] HOLD] aspirin  325 mg Oral Daily  . [MAR HOLD] clonazePAM  0.5 mg Oral QHS  . [MAR HOLD] insulin aspart  0-9 Units Subcutaneous TID WC  . [facility] HOLD] metoprolol tartrate  12.5 mg Oral BID  . [MAR HOLD] PARoxetine  20 mg Oral Daily  . [facility] HOLD] tamsulosin  0.4 mg Oral QHS    Time spent on care of this patient: 35 mins  ELLIS,ALLISON L. ANP  [facility] Office  [phone] Pager - Text Page per Loretha Stapler as per below:  On-Call/Text Page:      [email or web address]      password [email or web address]  If 7PM-7AM, please contact night-coverage [email or web address] Password [email or web address] 12/18/2012, 2:32 PM   LOS: 2 days   I have personally examined this patient and reviewed the entire database. I have reviewed the above note, made any necessary editorial changes,  and agree with its content.  Lonia Blood, MD [facility]

## 2012-12-18 NOTE — Progress Notes (Signed)
Right radial TR band removed at 1705 without complications. 2x2 gauze and tegaderm applied. Right radial site with minimal bruising at puncture site. Reverse Allen's test positive for good blood flow. Will continue to monitor.

## 2012-12-18 NOTE — Care Management Note (Signed)
    Page 1 of 1   12/18/2012     12:43:23 PM   CARE MANAGEMENT NOTE 12/18/2012  Patient:  Jennifer [name], Jennifer [name]   Account Number:  [id number]  Date Initiated:  12/18/2012  Documentation initiated by:  Junius Creamer  Subjective/Objective Assessment:   adm w mi     Action/Plan:   lives w fam, pcp dr Beatrix Fetters shah   Anticipated DC Date:     Anticipated DC Plan:        DC Planning Services  CM consult      Choice offered to / List presented to:             Status of service:   Medicare Important Message given?   (If response is "NO", the following Medicare IM given date fields will be blank) Date Medicare IM given:   Date Additional Medicare IM given:    Discharge Disposition:    Per UR Regulation:  Reviewed for med. necessity/level of care/duration of stay  If discussed at Long Length of Stay Meetings, dates discussed:    Comments:

## 2012-12-18 NOTE — Progress Notes (Signed)
TELEMETRY: Reviewed telemetry pt in NSR: Filed Vitals:   12/18/12 0000 12/18/12 0010 12/18/12 0400 12/18/12 0740  BP: 101/33 127/71 119/40 146/65  Pulse: 84 80 70 73  Temp: 99.2 F (37.3 C)  98.3 F (36.8 C) 98.3 F (36.8 C)  TempSrc: Oral  Oral Oral  Resp: 26 18 12 14   Height:      Weight:      SpO2: 96% 100% 97% 96%    Intake/Output Summary (Last 24 hours) at 12/18/12 0857 Last data filed at 12/18/12 0800  Gross per 24 hour  Intake 2184.6 ml  Output   1100 ml  Net 1084.6 ml    SUBJECTIVE Denies any chest pain or SOB. No N/V.  LABS: Basic Metabolic Panel:  Recent Labs  09/60/45 0539 12/18/12 0500  NA 142 142  K 4.1 3.7  CL 107 106  CO2 33* 34*  GLUCOSE 97 101*  BUN 12 8  CREATININE 0.63 0.60  CALCIUM 8.4 8.8   Liver Function Tests:  Recent Labs  12/17/12 0539  AST 19  ALT 10  ALKPHOS 48  BILITOT 0.3  PROT 5.2*  ALBUMIN 2.8*   CBC:  Recent Labs  12/17/12 0539 12/18/12 0500  WBC 8.3 7.7  HGB 11.7* 11.6*  HCT 34.4* 34.4*  MCV 96.1 95.6  PLT 145* 143*   Cardiac Enzymes:  Recent Labs  12/16/12 0520 12/16/12 0912 12/16/12 1624  TROPONINI 2.67* 1.79* 1.12*   Hemoglobin A1C:  Recent Labs  12/16/12 0520  HGBA1C 5.7*   Fasting Lipid Panel:  Recent Labs  12/16/12 0520  CHOL 152  HDL 57  LDLCALC 88  TRIG 34  CHOLHDL 2.7   Radiology/Studies:  No results found.  Ecg: NSR with deep T wave inversion anterior and lateral leads.  Echo: Study Conclusions  - Left ventricle: The cavity size was normal. Systolic function was moderately to severely reduced. The estimated ejection fraction was in the range of 30% to 35%. There is akinesis of the basal-midanteroseptal myocardium. There is moderate hypokinesis of the entireapical myocardium. There is moderate hypokinesis of the distallateral myocardium. There was an increased relative contribution of atrial contraction to ventricular filling. Doppler parameters are consistent  with abnormal left ventricular relaxation (grade 1 diastolic dysfunction). - Mitral valve: Mild regurgitation.  PHYSICAL EXAM General: Well developed, well nourished, in no acute distress. Head: Normocephalic, atraumatic, sclera non-icteric, no xanthomas, nares are without discharge. Neck: Negative for carotid bruits. JVD not elevated. Lungs: Clear bilaterally to auscultation without wheezes, rales, or rhonchi. Breathing is unlabored. Heart: RRR S1 S2 without murmurs, rubs, or gallops.  Abdomen: Soft, non-tender, non-distended with normoactive bowel sounds. No hepatomegaly. No rebound/guarding. No obvious abdominal masses. Msk:  Strength and tone appears normal for age. Extremities: No clubbing, cyanosis or edema.  Distal pedal pulses are 2+ and equal bilaterally. Neuro: Alert and oriented X 3. Moves all extremities spontaneously. Psych:  Responds to questions appropriately with a normal affect.  ASSESSMENT AND PLAN: 1. NSTEMI. Differential includes severe CAD versus Takotsubo's syndrome. (No real chest pain). Agree with current management. Will proceed with cardiac cath today +/- PCI. On heparin and Ntg. Start low dose metoprolol. 2. Renal calculi 3. HTN. 4. DM type 2.  Principal Problem:   Acute coronary syndrome Active Problems:   NSTEMI (non-ST elevated myocardial infarction)   Hydronephrosis of left kidney   Nephrolithiasis   Type II or unspecified type diabetes mellitus without mention of complication, not stated as uncontrolled   Other and unspecified  hyperlipidemia   DDD (degenerative disc disease), lumbar   Psoriatic arthritis    Signed, Peter [place] MD,FACC 12/18/2012 9:02 AM

## 2012-12-18 NOTE — CV Procedure (Signed)
   Cardiac Catheterization Procedure Note  Name: Jennifer [name] MRN: [id number] DOB: 1941-03-07  Procedure: Left Heart Cath, Selective Coronary Angiography, LV angiography  Indication: 72 yo WF presents with acute diaphoresis and elevated cardiac enzymes consistent with NSTEMI.    Procedural Details: The right wrist was prepped, draped, and anesthetized with 1% lidocaine. Using the modified Seldinger technique, a 5 French sheath was introduced into the right radial artery. 3 mg of verapamil was administered through the sheath, weight-based unfractionated heparin was administered intravenously. Standard Judkins catheters were used for selective coronary angiography and left ventriculography. Catheter exchanges were performed over an exchange length guidewire. There were no immediate procedural complications. A TR band was used for radial hemostasis at the completion of the procedure.  The patient was transferred to the post catheterization recovery area for further monitoring.  Procedural Findings: Hemodynamics: AO 108/49 mean 71 mm Hg LV 109/18 mm Hg  Coronary angiography: Coronary dominance: right  Left mainstem: Calcified, 20% distal disease  Left anterior descending (LAD): Moderate calcification, Diffuse disease in the mid to distal LAD. 50% disease in the mid vessel. Focal 70% stenosis distally.  Left circumflex (LCx): 30% proximal vessel.   Right coronary artery (RCA): dominant vessel. Diffuse disease in the mid vessel up to 40-50%  Left ventriculography: Left ventricular systolic function is abnormal, LVEF is estimated at 40-45%%, there is hypokinesis affecting the mid to distal anterior wall, apex, and mid to distal inferior wall, there is no significant mitral regurgitation   Final Conclusions:   1. Nonobstructive CAD 2. Moderate LV dysfunction. Pattern of wall motion abnormality is consistent with Takotsubo's cardiomyopathy.  Recommendations: medical management.  Theron Arista  [facility] 12/18/2012, 2:17 PM

## 2012-12-19 DIAGNOSIS — I5181 Takotsubo syndrome: Secondary | ICD-10-CM | POA: Diagnosis present

## 2012-12-19 LAB — GLUCOSE, CAPILLARY

## 2012-12-19 LAB — CBC
HCT: 32.1 % — ABNORMAL LOW (ref 36.0–46.0)
Hemoglobin: 10.8 g/dL — ABNORMAL LOW (ref 12.0–15.0)
MCV: 96.4 fL (ref 78.0–100.0)
RBC: 3.33 MIL/uL — ABNORMAL LOW (ref 3.87–5.11)
WBC: 7.3 10*3/uL (ref 4.0–10.5)

## 2012-12-19 LAB — BASIC METABOLIC PANEL
BUN: 13 mg/dL (ref 6–23)
Calcium: 8.9 mg/dL (ref 8.4–10.5)
Chloride: 105 mEq/L (ref 96–112)
Creatinine, Ser: 0.6 mg/dL (ref 0.50–1.10)
GFR calc Af Amer: >90 mL/min (ref >90)

## 2012-12-19 MED ORDER — METOPROLOL TARTRATE 25 MG PO TABS: 25.0000 mg | ORAL_TABLET | Freq: Two times a day (BID) | ORAL | Status: DC

## 2012-12-19 MED ORDER — ATORVASTATIN CALCIUM 10 MG PO TABS: 10.0000 mg | ORAL_TABLET | Freq: Every day | ORAL | Status: DC

## 2012-12-19 MED ORDER — METOPROLOL TARTRATE 25 MG PO TABS
25.0000 mg | ORAL_TABLET | Freq: Two times a day (BID) | ORAL | Status: DC
  Filled 2012-12-19: qty 1

## 2012-12-19 MED ORDER — LOSARTAN POTASSIUM 50 MG PO TABS: 50.0000 mg | ORAL_TABLET | Freq: Every day | ORAL | Status: DC

## 2012-12-19 MED ORDER — ATORVASTATIN CALCIUM 10 MG PO TABS
10.0000 mg | ORAL_TABLET | Freq: Every day | ORAL | Status: DC
  Filled 2012-12-19: qty 1

## 2012-12-19 MED ORDER — ASPIRIN 81 MG PO TBEC: 81.0000 mg | DELAYED_RELEASE_TABLET | Freq: Every day | ORAL | Status: DC

## 2012-12-19 NOTE — Discharge Summary (Signed)
Physician Discharge Summary  Jennifer [name]:[id number] DOB: Apr 23, 1941 DOA: 12/16/2012  PCP: Kirstie Peri, MD  Admit date: 12/16/2012 Discharge date: 12/19/2012  Time spent: 45 minutes  F/u:  - will need outpt Urology referral- can be made by PCP- pt lives in [place] and will need a Urologist in that vicinity - Will need f/u LFTs in 1 month (started on Lipitor by cardiology)  Discharge Diagnoses:  Principal Problem:   Takotsubo syndrome Active Problems:   NSTEMI (non-ST elevated myocardial infarction)   Acute coronary syndrome   Hydronephrosis of left kidney   Nephrolithiasis   Type II or unspecified type diabetes mellitus without mention of complication, not stated as uncontrolled   Other and unspecified hyperlipidemia   DDD (degenerative disc disease), lumbar   Psoriatic arthritis   Discharge Condition: stable  Diet recommendation: heart healthy/ diabetic  Filed Weights   12/16/12 0100  Weight: 57.1 kg (125 lb 14.1 oz)    History of present illness:  72 y.o. female who presented to the [facility] ED due to severe sharp intermittent left flank and left lower ABD pain that was worsening over 1 week. She has a history of kidneys stones and thought that she may have had a stone. While in the ED she was being evaluated and became transiently unresponsive, bradycardic, cyanotic and hypoxic. Cardiac enzymes and an EKG were performed and the troponin was found to be elevated at 1.35. Patient denied chest pain, SOB, nausea/vomiting, or diaphoresis. A CT of the ABD was performed as well as a renal US which found obstruction and hydronephrosis of the L ureter, and a right non-obstructing renal calculus as well. [facility] was consulted and arrangements were made to transfer the patient to [facility] for Cardiology and Urology evaluations   Hospital Course:  NSTEMI  [facility] consulted - cardiac cath 6/30 c/w Takatsubo Syndrome   Newly diagnosed systolic CHF   Due to [facility] and expected to recover full LV fxn after medical Rx -CArdiology recomending cont Metoprolol at 25 mg BID and add Lipitor- also added baby ASA  Mild hydronephrosis of left kidney w/ neprholithiasis  crt 0.64 at [facility] - UA remarkable for hematuria only - CT report from [facility] 6/27 notes "mild left hydronephrosis and moderate left hydroureter associated with a 0.6 x 0.4 x 0.3 com L UVJ calculus" - crt stable - no pain today - no hematuria - Will need outpt Urology referral  Type II diabetes mellitus  Diet controlled at home - CBG well controlled - A1c 5.7   Hyperlipidemia  LDL 88 w/ HDL 57 - no strong indication for medical management at the present time - diet adjustment suggested - Cardiology recommending Lipitor as well- LFTs needed in 1 month  DDD lumbar spine  Patient informs me that she is scheduled to have spinal surgery per Dr. Trey Sailors in approximately 2 months   Psoriatic arthritis  Well compensated at present      Procedures: Cardiac cath- 6/30- Dr [place]- Final Conclusions:  1. Nonobstructive CAD  2. Moderate LV dysfunction. Pattern of wall motion abnormality is consistent with Takotsubo's cardiomyopathy- EF 40-45 %   Consultations:  Cardiology  Discharge Exam: Filed Vitals:   12/18/12 2335 12/19/12 0400 12/19/12 0747 12/19/12 1145  BP: 126/62 98/63 116/73 118/73  Pulse: 69 64 93 68  Temp: 99.2 F (37.3 C) 99.3 F (37.4 C) 98.9 F (37.2 C) 98.8 F (37.1 C)  TempSrc: Oral Oral Oral Oral  Resp: 18 18 18  18  Height:      Weight:      SpO2: 94% 90% 92% 93%    General: AAIO x3, no distress Cardiovascular: RRR , no murmurs Respiratory: CTA b/l   Discharge Instructions      Discharge Orders   Future Orders Complete By Expires     Diet - low sodium heart healthy  As directed     Increase activity slowly  As directed         Medication List    STOP taking these medications       ciprofloxacin 250 MG tablet  Commonly known  as:  CIPRO      TAKE these medications       acetaminophen 325 MG tablet  Commonly known as:  TYLENOL  Take 325-650 mg by mouth every 6 (six) hours as needed for pain (back pain).     ARTIFICIAL TEARS OP  Place 2 drops into both eyes daily as needed (dry eyes).     aspirin 81 MG EC tablet  Take 1 tablet (81 mg total) by mouth daily. Swallow whole.     atorvastatin 10 MG tablet  Commonly known as:  LIPITOR  Take 1 tablet (10 mg total) by mouth daily at 6 PM.     CENTRUM SILVER PO  Take 1 tablet by mouth daily.     clonazePAM 0.5 MG tablet  Commonly known as:  KLONOPIN  Take 0.5 mg by mouth at bedtime.     doxazosin 8 MG tablet  Commonly known as:  CARDURA  Take 8 mg by mouth daily.     fish oil-omega-3 fatty acids 1000 MG capsule  Take 1 g by mouth daily.     HYDROcodone-acetaminophen 5-325 MG per tablet  Commonly known as:  NORCO/VICODIN  Take 1 tablet by mouth every 8 (eight) hours as needed for pain (back pain).     losartan-hydrochlorothiazide 50-12.5 MG per tablet  Commonly known as:  HYZAAR  Take 1 tablet by mouth daily.     metoprolol tartrate 25 MG tablet  Commonly known as:  LOPRESSOR  Take 1 tablet (25 mg total) by mouth 2 (two) times daily.     PARoxetine 40 MG tablet  Commonly known as:  PAXIL  Take 40 mg by mouth daily.     STELARA 45 MG/0.5ML Soln  Generic drug:  Ustekinumab  Inject 45 mg into the skin See admin instructions. Inject 45 mg under the skin under the skin week 0, then week 4, followed by 45 mg every 12 weeks thereafter       Not on File    The results of significant diagnostics from this hospitalization (including imaging, microbiology, ancillary and laboratory) are listed below for reference.    Significant Diagnostic Studies: No results found.  Microbiology: Recent Results (from the past 240 hour(s))  MRSA PCR SCREENING     Status: None   Collection Time    12/16/12  4:12 AM      Result Value Range Status   MRSA by PCR  NEGATIVE  NEGATIVE Final   Comment:            The GeneXpert MRSA Assay (FDA     approved for NASAL specimens     only), is one component of a     comprehensive MRSA colonization     surveillance program. It is not     intended to diagnose MRSA     infection nor to guide or  monitor treatment for     MRSA infections.     Labs: Basic Metabolic Panel:  Recent Labs Lab 12/16/12 0520 12/17/12 0539 12/18/12 0500 12/18/12 1749 12/19/12 0454  NA 144 142 142  --  141  K 3.9 4.1 3.7  --  3.8  CL 106 107 106  --  105  CO2 35* 33* 34*  --  33*  GLUCOSE 104* 97 101*  --  98  BUN 14 12 8   --  13  CREATININE 0.72 0.63 0.60 0.53 0.60  CALCIUM 8.7 8.4 8.8  --  8.9   Liver Function Tests:  Recent Labs Lab 12/17/12 0539  AST 19  ALT 10  ALKPHOS 48  BILITOT 0.3  PROT 5.2*  ALBUMIN 2.8*   No results found for this basename: LIPASE, AMYLASE,  in the last 168 hours No results found for this basename: AMMONIA,  in the last 168 hours CBC:  Recent Labs Lab 12/16/12 0520 12/17/12 0539 12/18/12 0500 12/18/12 1749 12/19/12 0454  WBC 8.1 8.3 7.7 7.3 7.3  HGB 12.2 11.7* 11.6* 12.1 10.8*  HCT 35.6* 34.4* 34.4* 35.8* 32.1*  MCV 94.2 96.1 95.6 96.5 96.4  PLT 167 145* 143* 138* 138*   Cardiac Enzymes:  Recent Labs Lab 12/16/12 0520 12/16/12 0912 12/16/12 1624  TROPONINI 2.67* 1.79* 1.12*   BNP: BNP (last 3 results) No results found for this basename: PROBNP,  in the last 8760 hours CBG:  Recent Labs Lab 12/18/12 1433 12/18/12 1709 12/18/12 2057 12/19/12 0729 12/19/12 1130  GLUCAP 92 120* 124* 91 118*       Signed:  Nahomy Limburg  [facility] 12/19/2012, 1:24 PM

## 2012-12-19 NOTE — Progress Notes (Addendum)
TELEMETRY: Reviewed telemetry pt in NSR: Filed Vitals:   12/18/12 2335 12/19/12 0400 12/19/12 0747 12/19/12 1145  BP: 126/62 98/63 116/73 118/73  Pulse: 69 64 93 68  Temp: 99.2 F (37.3 C) 99.3 F (37.4 C) 98.9 F (37.2 C) 98.8 F (37.1 C)  TempSrc: Oral Oral Oral Oral  Resp: 18 18 18 18   Height:      Weight:      SpO2: 94% 90% 92% 93%   No intake or output data in the 24 hours ending 12/19/12 1153  SUBJECTIVE Denies any chest pain or SOB. No N/V. No flank pain.  LABS: Basic Metabolic Panel:  Recent Labs  13/08/65 0500 12/18/12 1749 12/19/12 0454  NA 142  --  141  K 3.7  --  3.8  CL 106  --  105  CO2 34*  --  33*  GLUCOSE 101*  --  98  BUN 8  --  13  CREATININE 0.60 0.53 0.60  CALCIUM 8.8  --  8.9   Liver Function Tests:  Recent Labs  12/17/12 0539  AST 19  ALT 10  ALKPHOS 48  BILITOT 0.3  PROT 5.2*  ALBUMIN 2.8*   CBC:  Recent Labs  12/18/12 1749 12/19/12 0454  WBC 7.3 7.3  HGB 12.1 10.8*  HCT 35.8* 32.1*  MCV 96.5 96.4  PLT 138* 138*   Cardiac Enzymes:  Recent Labs  12/16/12 1624  TROPONINI 1.12*   Radiology/Studies:  No results found.  Ecg: NSR with deep T wave inversion anterior and lateral leads.  Echo: Study Conclusions  - Left ventricle: The cavity size was normal. Systolic function was moderately to severely reduced. The estimated ejection fraction was in the range of 30% to 35%. There is akinesis of the basal-midanteroseptal myocardium. There is moderate hypokinesis of the entireapical myocardium. There is moderate hypokinesis of the distallateral myocardium. There was an increased relative contribution of atrial contraction to ventricular filling. Doppler parameters are consistent with abnormal left ventricular relaxation (grade 1 diastolic dysfunction). - Mitral valve: Mild regurgitation.  Cardiac Catheterization Procedure Note  Name: Jennifer [name]  MRN: [id number]  DOB: 05/17/1941  Procedure: Left Heart Cath,  Selective Coronary Angiography, LV angiography  Indication: 72 yo WF presents with acute diaphoresis and elevated cardiac enzymes consistent with NSTEMI.  Procedural Details: The right wrist was prepped, draped, and anesthetized with 1% lidocaine. Using the modified Seldinger technique, a 5 French sheath was introduced into the right radial artery. 3 mg of verapamil was administered through the sheath, weight-based unfractionated heparin was administered intravenously. Standard Judkins catheters were used for selective coronary angiography and left ventriculography. Catheter exchanges were performed over an exchange length guidewire. There were no immediate procedural complications. A TR band was used for radial hemostasis at the completion of the procedure. The patient was transferred to the post catheterization recovery area for further monitoring.  Procedural Findings:  Hemodynamics:  AO 108/49 mean 71 mm Hg  LV 109/18 mm Hg  Coronary angiography:  Coronary dominance: right  Left mainstem: Calcified, 20% distal disease  Left anterior descending (LAD): Moderate calcification, Diffuse disease in the mid to distal LAD. 50% disease in the mid vessel. Focal 70% stenosis distally.  Left circumflex (LCx): 30% proximal vessel.  Right coronary artery (RCA): dominant vessel. Diffuse disease in the mid vessel up to 40-50%  Left ventriculography: Left ventricular systolic function is abnormal, LVEF is estimated at 40-45%%, there is hypokinesis affecting the mid to distal anterior wall, apex, and mid  to distal inferior wall, there is no significant mitral regurgitation  Final Conclusions:  1. Nonobstructive CAD  2. Moderate LV dysfunction. Pattern of wall motion abnormality is consistent with Takotsubo's cardiomyopathy.  Recommendations: medical management.  Theron Arista [facility]  12/18/2012, 2:17 PM  PHYSICAL EXAM General: Well developed, well nourished, in no acute distress. Head: Normocephalic,  atraumatic, sclera non-icteric, no xanthomas, nares are without discharge. Neck: Negative for carotid bruits. JVD not elevated. Lungs: Clear bilaterally to auscultation without wheezes, rales, or rhonchi. Breathing is unlabored. Heart: RRR S1 S2 without murmurs, rubs, or gallops.  Abdomen: Soft, non-tender, non-distended with normoactive bowel sounds. No hepatomegaly. No rebound/guarding. No obvious abdominal masses. Msk:  Strength and tone appears normal for age. Extremities: No clubbing, cyanosis or edema.  Distal pedal pulses are 2+ and equal bilaterally. No radial hematoma. Neuro: Alert and oriented X 3. Moves all extremities spontaneously. Psych:  Responds to questions appropriately with a normal affect.  ASSESSMENT AND PLAN: 1.  Takotsubo's syndrome. (No real chest pain). Stable on medical therapy. Continue cozaar and low dose metoprolol. OK for discharge home today from our standpoint. Needs follow up in our [place] office in 2 weeks. Would hold cardura and HCTZ. Repeat Echo in 2-3 months. Would like to avoid surgical procedures until cardiac status recovered.  2. Renal calculi 3. HTN. 4. DM type 2. 5. CAD diffuse nonobstructive CAD. Risk factor modification. Will add lipitor 10 mg daily.  Principal Problem:   Acute coronary syndrome Active Problems:   NSTEMI (non-ST elevated myocardial infarction)   Hydronephrosis of left kidney   Nephrolithiasis   Type II or unspecified type diabetes mellitus without mention of complication, not stated as uncontrolled   Other and unspecified hyperlipidemia   DDD (degenerative disc disease), lumbar   Psoriatic arthritis    Signed, Isiaha Greenup [place] MD,FACC 12/19/2012 11:53 AM

## 2012-12-21 ENCOUNTER — Telehealth: Payer: Self-pay | Admitting: Cardiology

## 2012-12-21 NOTE — Telephone Encounter (Signed)
New Prob    Has some questions regarding pts medications and follow up appt. Please call.

## 2012-12-21 NOTE — Telephone Encounter (Signed)
Returned call to patient spoke to husband he stated Dr.Jordan stopped cardura.Stated on discharge instructions it says to take cardura.Advised to stop cardura.Will send message to Dr.Jordan for clarification.

## 2012-12-22 NOTE — Telephone Encounter (Signed)
Yes she is supposed to stop taking cardura. We will use other meds to control BP.  Jennifer Boule [place] MD, [facility]

## 2012-12-25 NOTE — Telephone Encounter (Signed)
Returned call to patient's brother Fredrik Cove Dr.Jordan advised to stop taking cardura.Advised to call [facility] office to schedule post hospital appointment.

## 2013-01-11 ENCOUNTER — Encounter: Payer: Self-pay | Admitting: Physician Assistant

## 2013-01-11 ENCOUNTER — Telehealth: Payer: Self-pay | Admitting: *Deleted

## 2013-01-11 ENCOUNTER — Ambulatory Visit (INDEPENDENT_AMBULATORY_CARE_PROVIDER_SITE_OTHER): Payer: Medicare Other | Admitting: Physician Assistant

## 2013-01-11 VITALS — BP 116/60 | HR 42 | Ht 60.0 in | Wt 122.1 lb

## 2013-01-11 DIAGNOSIS — I498 Other specified cardiac arrhythmias: Secondary | ICD-10-CM

## 2013-01-11 DIAGNOSIS — E119 Type 2 diabetes mellitus without complications: Secondary | ICD-10-CM

## 2013-01-11 DIAGNOSIS — E785 Hyperlipidemia, unspecified: Secondary | ICD-10-CM | POA: Insufficient documentation

## 2013-01-11 DIAGNOSIS — E782 Mixed hyperlipidemia: Secondary | ICD-10-CM

## 2013-01-11 DIAGNOSIS — R001 Bradycardia, unspecified: Secondary | ICD-10-CM

## 2013-01-11 DIAGNOSIS — I5181 Takotsubo syndrome: Secondary | ICD-10-CM

## 2013-01-11 DIAGNOSIS — I429 Cardiomyopathy, unspecified: Secondary | ICD-10-CM

## 2013-01-11 DIAGNOSIS — I10 Essential (primary) hypertension: Secondary | ICD-10-CM

## 2013-01-11 MED ORDER — ATORVASTATIN CALCIUM 10 MG PO TABS: 10.0000 mg | ORAL_TABLET | Freq: Every day | ORAL | Status: DC

## 2013-01-11 MED ORDER — METOPROLOL SUCCINATE ER 25 MG PO TB24: 25.0000 mg | ORAL_TABLET | Freq: Every day | ORAL | Status: DC

## 2013-01-11 NOTE — Assessment & Plan Note (Signed)
Very well controlled on current medication regimen 

## 2013-01-11 NOTE — Assessment & Plan Note (Signed)
Current Lopressor dose to be substituted with Toprol-XL 25 mg daily, following development of significant bradycardia.

## 2013-01-11 NOTE — Assessment & Plan Note (Signed)
Followed by primary M.D. 

## 2013-01-11 NOTE — Patient Instructions (Addendum)
Your physician recommends that you return for lab work in:   FASTING LIPIDS AND LFT'S AT [facility] IN OUTPATIENT DEPARTMENT IN 25 WEEKS  Your physician has requested that you have an echocardiogram. Echocardiography is a painless test that uses sound waves to create images of your heart. It provides your doctor with information about the size and shape of your heart and how well your heart's chambers and valves are working. This procedure takes approximately one hour. There are no restrictions for this procedure.  Your physician has recommended you make the following change in your medication:   START TOPROL-XL 25 MG ONCE A DAY STOP LOPRESSOR CONTINUE ON ALL OTHER MEDICATIONS  Your physician recommends that you schedule a follow-up appointment in: 2 MONTHS WITH DR. Diona Browner

## 2013-01-11 NOTE — Assessment & Plan Note (Signed)
Patient recently placed on low-dose statin therapy. Will check followup FLP/LFT profile in 12 weeks. Recommend target LDL 100 or less, if feasible.

## 2013-01-11 NOTE — Progress Notes (Signed)
Primary Cardiologist: Simona Huh, MD   HPI: Post hospital followup from [facility], status post recent presentation with NST EMI. Patient was transferred directly from [facility] ED. Troponin I 1.35. Coronary angiography results, as follows:   - Cardiac catheterization, June 30: Nonobstructive CAD; EF 40-45%; pattern of wall motion abnormality consistent with Takotsubo's cardiomyopathy  Patient denies any CP or symptoms suggestive of congestive heart. She notes no complications of R wrist incision site.  No Known Allergies  Current Outpatient Prescriptions  Medication Sig Dispense Refill  . acetaminophen (TYLENOL) 325 MG tablet Take 325-650 mg by mouth every 6 (six) hours as needed for pain (back pain).      Marland Kitchen aspirin 81 MG EC tablet Take 1 tablet (81 mg total) by mouth daily. Swallow whole.  30 tablet  12  . atorvastatin (LIPITOR) 10 MG tablet Take 1 tablet (10 mg total) by mouth daily at 6 PM.  30 tablet  0  . clonazePAM (KLONOPIN) 0.5 MG tablet Take 0.5 mg by mouth at bedtime.      . fish oil-omega-3 fatty acids 1000 MG capsule Take 2 g by mouth daily.       Marland Kitchen HYDROcodone-acetaminophen (NORCO/VICODIN) 5-325 MG per tablet Take 1 tablet by mouth 2 (two) times daily.       . Hypromellose (ARTIFICIAL TEARS OP) Place 2 drops into both eyes daily as needed (dry eyes).      Marland Kitchen losartan-hydrochlorothiazide (HYZAAR) 50-12.5 MG per tablet Take 1 tablet by mouth daily.      . metoprolol tartrate (LOPRESSOR) 25 MG tablet Take 1 tablet (25 mg total) by mouth 2 (two) times daily.  60 tablet  0  . Multiple Vitamins-Minerals (CENTRUM SILVER PO) Take 1 tablet by mouth daily.      Marland Kitchen PARoxetine (PAXIL) 40 MG tablet Take 40 mg by mouth daily.      Marland Kitchen Ustekinumab (STELARA) 45 MG/0.5ML SOLN Inject 45 mg into the skin See admin instructions. Inject 45 mg under the skin under the skin week 0, then week 4, followed by 45 mg every 12 weeks thereafter       No current facility-administered medications for this visit.    Past  Medical History  Diagnosis Date  . Anxiety   . Diabetes mellitus     Borderline  . Hyperlipidemia   . Anxiety   . DDD (degenerative disc disease), lumbar   . Psoriatic arthritis   . Kidney stones   . Hypertension   . Depression     Past Surgical History  Procedure Laterality Date  . Appendectomy    . Cholecystectomy      History   Social History  . Marital Status: Single    Spouse Name: N/A    Number of Children: N/A  . Years of Education: N/A   Occupational History  . Not on file.   Social History Main Topics  . Smoking status: Never Smoker   . Smokeless tobacco: Not on file  . Alcohol Use: No  . Drug Use: No  . Sexually Active: Not Currently   Other Topics Concern  . Not on file   Social History Narrative  . No narrative on file    Family History  Problem Relation Age of Onset  . CAD Father   . CAD Mother   . Hypertension Mother   . Breast cancer Mother   . Hypertension Brother      X 2    ROS: no nausea, vomiting; no fever, chills; no melena,  hematochezia; no claudication  PHYSICAL EXAM: BP 116/60  Pulse 42  Ht 5' (1.524 m)  Wt 122 lb 1.9 oz (55.393 kg)  BMI 23.85 kg/m2  SpO2 95% GENERAL: 72 year old female; NAD HEENT: NCAT, PERRLA, EOMI; sclera clear; no xanthelasma NECK: palpable bilateral carotid pulses, no bruits; no JVD; no TM LUNGS: CTA bilaterally CARDIAC: RRR (S1, S2); no significant murmurs; no rubs or gallops ABDOMEN: soft, non-tender; intact BS EXTREMETIES: R wrist stable with intact pulse, no hematoma; no peripheral edema SKIN: warm/dry; no obvious rash/lesions MUSCULOSKELETAL: no joint deformity NEURO: no focal deficit; NL affect   EKG:    ASSESSMENT & PLAN:  Takotsubo syndrome Continue current treatment regimen with low dose ASA, beta blocker, and ARB. Repeat echocardiogram in 2 months, to assess for full recovery of LVF. Of note, beta blocker to be decreased and changed to Toprol-XL 25 mg daily, following development  of significant bradycardia.  Sinus bradycardia Current Lopressor dose to be substituted with Toprol-XL 25 mg daily, following development of significant bradycardia.  HLD (hyperlipidemia) Patient recently placed on low-dose statin therapy. Will check followup FLP/LFT profile in 12 weeks. Recommend target LDL 100 or less, if feasible.  HTN (hypertension) Very well controlled on current medication regimen  Type II or unspecified type diabetes mellitus without mention of complication, not stated as uncontrolled Followed by primary M.D.    Gene Daniela Hernan, PAC

## 2013-01-11 NOTE — Telephone Encounter (Signed)
spoke with pt husband and he to inform pt to come into office in 12 weeks to get script for labs flp/lft. husband stated he will let pt know and he will remind office when pt comes back in two months.

## 2013-01-11 NOTE — Assessment & Plan Note (Signed)
Continue current treatment regimen with low dose ASA, beta blocker, and ARB. Repeat echocardiogram in 2 months, to assess for full recovery of LVF. Of note, beta blocker to be decreased and changed to Toprol-XL 25 mg daily, following development of significant bradycardia.

## 2013-02-28 ENCOUNTER — Other Ambulatory Visit (INDEPENDENT_AMBULATORY_CARE_PROVIDER_SITE_OTHER): Payer: Medicare Other

## 2013-02-28 ENCOUNTER — Other Ambulatory Visit: Payer: Self-pay

## 2013-02-28 DIAGNOSIS — E782 Mixed hyperlipidemia: Secondary | ICD-10-CM

## 2013-02-28 DIAGNOSIS — I519 Heart disease, unspecified: Secondary | ICD-10-CM

## 2013-02-28 DIAGNOSIS — I498 Other specified cardiac arrhythmias: Secondary | ICD-10-CM

## 2013-02-28 DIAGNOSIS — I429 Cardiomyopathy, unspecified: Secondary | ICD-10-CM

## 2013-03-01 ENCOUNTER — Telehealth: Payer: Self-pay | Admitting: *Deleted

## 2013-03-01 NOTE — Telephone Encounter (Signed)
Patient informed. 

## 2013-03-01 NOTE — Telephone Encounter (Signed)
Message copied by Eustace Moore on Thu Mar 01, 2013  1:36 PM ------      Message from: MCDOWELL, Illene Bolus      Created: Wed Feb 28, 2013 12:30 PM       Records indicate history of Tako-tsubo cardiomyopathy. This study shows normalization of LV function, very good response. Let her know and can discuss further at followup. ------

## 2013-03-15 ENCOUNTER — Encounter: Payer: Self-pay | Admitting: Cardiology

## 2013-03-15 ENCOUNTER — Ambulatory Visit (INDEPENDENT_AMBULATORY_CARE_PROVIDER_SITE_OTHER): Payer: Medicare Other | Admitting: Cardiology

## 2013-03-15 VITALS — BP 151/76 | HR 50 | Ht 60.0 in | Wt 122.8 lb

## 2013-03-15 DIAGNOSIS — I2510 Atherosclerotic heart disease of native coronary artery without angina pectoris: Secondary | ICD-10-CM

## 2013-03-15 DIAGNOSIS — I5181 Takotsubo syndrome: Secondary | ICD-10-CM

## 2013-03-15 DIAGNOSIS — I10 Essential (primary) hypertension: Secondary | ICD-10-CM

## 2013-03-15 DIAGNOSIS — E785 Hyperlipidemia, unspecified: Secondary | ICD-10-CM

## 2013-03-15 MED ORDER — METOPROLOL SUCCINATE ER 25 MG PO TB24: 12.5000 mg | ORAL_TABLET | Freq: Every day | ORAL | Status: DC

## 2013-03-15 NOTE — Assessment & Plan Note (Signed)
She continues on Lipitor. 

## 2013-03-15 NOTE — Assessment & Plan Note (Signed)
As is typically the case, LV systolic function has normalized. Patient is doing well without active symptoms. Plan will be to continue medical therapy and observation, reduce Toprol-XL to 12.5 mg daily secondary to bradycardia. Her event was back in June. I would think that if she is being considered for surgery under anesthesia in November, this would be an appropriate recovery time, and she should be able to proceed at an acceptable perioperative cardiac risk. I will plan to see her back in about a month.

## 2013-03-15 NOTE — Patient Instructions (Addendum)
Your physician recommends that you schedule a follow-up appointment in: 1 month. You will be contacted to get this scheduled. If you haven't heard from our office by end of next week, please contact our office. Your physician has recommended you make the following change in your medication: Decrease your toprol xl to 12.5 mg daily. Please break your 25 mg tablet in half. Your new prescription has been sent to your pharmacy. All other medications will remain the same.

## 2013-03-15 NOTE — Progress Notes (Signed)
Clinical Summary- Ms. Thier is a 72 y.o.female last seen in June by Mr. Serpe PA-C. She is here with her brother. She reports no chest pain symptoms or unusual breathlessness. States that she has been compliant with her medications.  Recent echocardiogram shows normal LVEF 60-65% without regional wall motion abnormalities, grade 1 diastolic dysfunction, PASP 25 mm mercury. We reviewed these results today.  She reports nephrolithiasis with flareups over the years, will be seeing a urologist within the next month, may actually need to have surgical intervention at some point. She also tells me that she is seeing Dr. Channing Mutters with consideration of lumbar surgery perhaps in November.   No Known Allergies  Current Outpatient Prescriptions  Medication Sig Dispense Refill  . acetaminophen (TYLENOL) 325 MG tablet Take 325-650 mg by mouth every 6 (six) hours as needed for pain (back pain).      Marland Kitchen aspirin 81 MG EC tablet Take 1 tablet (81 mg total) by mouth daily. Swallow whole.  30 tablet  12  . atorvastatin (LIPITOR) 10 MG tablet Take 1 tablet (10 mg total) by mouth daily at 6 PM.  30 tablet  5  . clonazePAM (KLONOPIN) 0.5 MG tablet Take 0.5 mg by mouth at bedtime as needed.       . fish oil-omega-3 fatty acids 1000 MG capsule Take 2 g by mouth daily.       Marland Kitchen HYDROcodone-acetaminophen (NORCO/VICODIN) 5-325 MG per tablet Take 1 tablet by mouth 2 (two) times daily.       . Hypromellose (ARTIFICIAL TEARS OP) Place 2 drops into both eyes daily as needed (dry eyes).      Marland Kitchen losartan-hydrochlorothiazide (HYZAAR) 50-12.5 MG per tablet Take 1 tablet by mouth daily.      . metoprolol succinate (TOPROL XL) 25 MG 24 hr tablet Take 0.5 tablets (12.5 mg total) by mouth daily.  15 tablet  6  . Multiple Vitamins-Minerals (CENTRUM SILVER PO) Take 1 tablet by mouth daily.      Marland Kitchen PARoxetine (PAXIL) 40 MG tablet Take 40 mg by mouth daily.      Marland Kitchen Ustekinumab (STELARA) 45 MG/0.5ML SOLN Inject 45 mg into the skin See admin  instructions. Inject 45 mg under the skin under the skin week 0, then week 4, followed by 45 mg every 12 weeks thereafter       No current facility-administered medications for this visit.    Past Medical History  Diagnosis Date  . Anxiety   . Diabetes mellitus     Borderline  . Hyperlipidemia   . Anxiety   . DDD (degenerative disc disease), lumbar   . Psoriatic arthritis   . Nephrolithiasis   . Essential hypertension, benign   . Depression   . Takotsubo cardiomyopathy     LVEF 40-45% June 2014  . Coronary atherosclerosis of native coronary artery     Nonobstructive at catheterization June 2014    Social History Ms. Phagan reports that she has never smoked. She does not have any smokeless tobacco history on file. Ms. Goodall reports that she does not drink alcohol.  Review of Systems No dizziness or syncope. No major abdominal pain. No active hematuria. No orthopnea or PND. No chest pain. Otherwise negative.  Physical Examination Filed Vitals:   03/15/13 1513  BP: 151/76  Pulse: 50   Filed Weights   03/15/13 1513  Weight: 122 lb 12.8 oz (55.702 kg)   Appears comfortable. HEENT: Conjunctiva and lids normal, oropharynx clear. Neck: Supple, no elevated  JVP or carotid bruits, no thyromegaly. Lungs: Clear to auscultation, nonlabored breathing at rest. Cardiac: Regular rate and rhythm, no S3 or significant systolic murmur, no pericardial rub. Abdomen: Soft, nontender, bowel sounds present. Extremities: No pitting edema, distal pulses 2+. Skin: Warm and dry. Musculoskeletal: No kyphosis. Neuropsychiatric: Alert and oriented x3, affect grossly appropriate.   Problem List and Plan   Takotsubo syndrome As is typically the case, LV systolic function has normalized. Patient is doing well without active symptoms. Plan will be to continue medical therapy and observation, reduce Toprol-XL to 12.5 mg daily secondary to bradycardia. Her event was back in June. I would think  that if she is being considered for surgery under anesthesia in November, this would be an appropriate recovery time, and she should be able to proceed at an acceptable perioperative cardiac risk. I will plan to see her back in about a month.  Coronary atherosclerosis of native coronary artery Mild nonobstructive disease at catheterization in June 2014.  Essential hypertension, benign Blood pressure is mildly elevated today. Should keep regular followup with Dr. Sherryll Burger.  HLD (hyperlipidemia) She continues on Lipitor.    Jonelle Sidle, M.D., F.A.C.C.

## 2013-03-15 NOTE — Assessment & Plan Note (Signed)
Mild nonobstructive disease at catheterization in June 2014.

## 2013-03-15 NOTE — Assessment & Plan Note (Signed)
Blood pressure is mildly elevated today. Should keep regular followup with Dr. Sherryll Burger.

## 2013-04-10 ENCOUNTER — Encounter: Payer: Self-pay | Admitting: Cardiology

## 2013-04-10 ENCOUNTER — Ambulatory Visit (INDEPENDENT_AMBULATORY_CARE_PROVIDER_SITE_OTHER): Payer: Medicare Other | Admitting: Cardiology

## 2013-04-10 VITALS — BP 119/68 | HR 52 | Ht 60.0 in | Wt 120.8 lb

## 2013-04-10 DIAGNOSIS — I5181 Takotsubo syndrome: Secondary | ICD-10-CM

## 2013-04-10 DIAGNOSIS — E785 Hyperlipidemia, unspecified: Secondary | ICD-10-CM

## 2013-04-10 DIAGNOSIS — Z79899 Other long term (current) drug therapy: Secondary | ICD-10-CM

## 2013-04-10 DIAGNOSIS — Z01810 Encounter for preprocedural cardiovascular examination: Secondary | ICD-10-CM

## 2013-04-10 DIAGNOSIS — I2510 Atherosclerotic heart disease of native coronary artery without angina pectoris: Secondary | ICD-10-CM

## 2013-04-10 NOTE — Assessment & Plan Note (Signed)
Normalization of LVEF on medical therapy. Continue observation.

## 2013-04-10 NOTE — Assessment & Plan Note (Signed)
Nonobstructive at catheterization in June of this year. Continue medical therapy.

## 2013-04-10 NOTE — Assessment & Plan Note (Signed)
Patient is clinically stable from a cardiac perspective. She is on a good medical regimen at this time and has already demonstrated normalization of LVEF by echocardiogram. She should be able to proceed with lumbar spine surgery at an acceptable perioperative cardiac risk. Continue medical therapy, although certainly reasonable to hold aspirin perioperatively. Will schedule a three-month followup.

## 2013-04-10 NOTE — Progress Notes (Signed)
Clinical Summary Ms. Gemmill is a 72 y.o.female last seen in September. She is here with her brother today for followup. She states that she has had no chest pain or unusual shortness of breath, no palpitations or syncope. Indicates compliance with her medications.  Recent echocardiogram shows normal LVEF 60-65% without regional wall motion abnormalities, grade 1 diastolic dysfunction, PASP 25 mm mercury. We reviewed this at the last visit.  She plans to undergo lumbar spine surgery with Dr. Channing Mutters, likely sometime in November. She will be seeing him later this week.  She has not had followup lipids or LFTs as yet, tolerating low-dose Lipitor.   No Known Allergies  Current Outpatient Prescriptions  Medication Sig Dispense Refill  . acetaminophen (TYLENOL) 325 MG tablet Take 325-650 mg by mouth every 6 (six) hours as needed for pain (back pain).      Marland Kitchen aspirin 81 MG EC tablet Take 1 tablet (81 mg total) by mouth daily. Swallow whole.  30 tablet  12  . atorvastatin (LIPITOR) 10 MG tablet Take 1 tablet (10 mg total) by mouth daily at 6 PM.  30 tablet  5  . clobetasol cream (TEMOVATE) 0.05 % Apply 1 application topically 2 (two) times daily.      . clonazePAM (KLONOPIN) 0.5 MG tablet Take 0.5 mg by mouth at bedtime as needed.       . fish oil-omega-3 fatty acids 1000 MG capsule Take 2 g by mouth daily.       Marland Kitchen HYDROcodone-acetaminophen (NORCO/VICODIN) 5-325 MG per tablet Take 1 tablet by mouth 2 (two) times daily.       . Hypromellose (ARTIFICIAL TEARS OP) Place 2 drops into both eyes daily as needed (dry eyes).      Marland Kitchen losartan-hydrochlorothiazide (HYZAAR) 50-12.5 MG per tablet Take 1 tablet by mouth daily.      . metoprolol succinate (TOPROL XL) 25 MG 24 hr tablet Take 0.5 tablets (12.5 mg total) by mouth daily.  15 tablet  6  . Multiple Vitamins-Minerals (CENTRUM SILVER PO) Take 1 tablet by mouth daily.      Marland Kitchen PARoxetine (PAXIL) 40 MG tablet Take 40 mg by mouth daily.      Marland Kitchen Ustekinumab  (STELARA) 45 MG/0.5ML SOLN Inject 45 mg into the skin See admin instructions. Inject 45 mg under the skin under the skin week 0, then week 4, followed by 45 mg every 12 weeks thereafter       No current facility-administered medications for this visit.    Past Medical History  Diagnosis Date  . Anxiety   . Diabetes mellitus     Borderline  . Hyperlipidemia   . Anxiety   . DDD (degenerative disc disease), lumbar   . Psoriatic arthritis   . Nephrolithiasis   . Essential hypertension, benign   . Depression   . Takotsubo cardiomyopathy     LVEF 40-45% June 2014  . Coronary atherosclerosis of native coronary artery     Nonobstructive at catheterization June 2014    Social History Ms. Ron reports that she has never smoked. She does not have any smokeless tobacco history on file. Ms. Winkels reports that she does not drink alcohol.  Review of Systems Chronic back and leg pain. Reports no problems with kidney stones in the last few weeks. Otherwise negative.  Physical Examination Filed Vitals:   04/10/13 1044  BP: 119/68  Pulse: 52   Filed Weights   04/10/13 1044  Weight: 120 lb 12.8 oz (54.795 kg)  Appears comfortable.  HEENT: Conjunctiva and lids normal, oropharynx clear.  Neck: Supple, no elevated JVP or carotid bruits, no thyromegaly.  Lungs: Clear to auscultation, nonlabored breathing at rest.  Cardiac: Regular rate and rhythm, no S3 or significant systolic murmur, no pericardial rub.  Abdomen: Soft, nontender, bowel sounds present.  Extremities: No pitting edema, distal pulses 2+.  Skin: Warm and dry.  Musculoskeletal: No kyphosis.  Neuropsychiatric: Alert and oriented x3, affect grossly appropriate.   Problem List and Plan   Preoperative cardiovascular examination Patient is clinically stable from a cardiac perspective. She is on a good medical regimen at this time and has already demonstrated normalization of LVEF by echocardiogram. She should be able to  proceed with lumbar spine surgery at an acceptable perioperative cardiac risk. Continue medical therapy, although certainly reasonable to hold aspirin perioperatively. Will schedule a three-month followup.  Takotsubo syndrome Normalization of LVEF on medical therapy. Continue observation.  Coronary atherosclerosis of native coronary artery Nonobstructive at catheterization in June of this year. Continue medical therapy.  HLD (hyperlipidemia) Followup FLP and LFT. She is tolerating low-dose Lipitor.    Jonelle Sidle, M.D., F.A.C.C.

## 2013-04-10 NOTE — Assessment & Plan Note (Signed)
Followup FLP and LFT. She is tolerating low-dose Lipitor.

## 2013-04-10 NOTE — Patient Instructions (Signed)
Your physician recommends that you schedule a follow-up appointment in: 3 months. Your physician recommends that you continue on your current medications as directed. Please refer to the Current Medication list given to you today. Your physician recommends that you FASTING lipid/liver profile today at [facility].

## 2013-04-13 ENCOUNTER — Telehealth: Payer: Self-pay | Admitting: Cardiology

## 2013-04-13 NOTE — Telephone Encounter (Signed)
Pt informed

## 2013-04-13 NOTE — Telephone Encounter (Signed)
Message copied by Burnice Logan on Fri Apr 13, 2013  3:41 PM ------      Message from: MCDOWELL, Illene Bolus      Created: Wed Apr 11, 2013  9:36 AM       Reviewed. AST and ALT are normal. Lipids look very well controlled, LDL is 64. No change to low-dose Lipitor for now. ------

## 2013-07-16 ENCOUNTER — Encounter: Payer: Self-pay | Admitting: Cardiology

## 2013-07-16 NOTE — Progress Notes (Signed)
Clinical Summary Jennifer [name] is a 73 y.o.female last seen in October 2014. She had interval spine surgery with Dr. Channing Mutters. She states that she has done well, still wearing a small back brace and starting rehabilitation.  Labwork in October 2014 showed normal [name] and ALT, cholesterol 141, triglycerides 56, HDL 66, and LDL 64. She has tolerated low-dose Lipitor.  Echocardiogram from September 2014 showed normal LVEF 60-65% without regional wall motion abnormalities, grade 1 diastolic dysfunction, PASP 25 mm mercury.   She is here with her brother who had several questions about her medications. We discussed her cardiac history, indications for medical therapy and followup. She needed refills for Lipitor and Toprol-XL.   No Known Allergies  Current Outpatient Prescriptions  Medication Sig Dispense Refill  . acetaminophen (TYLENOL) 325 MG tablet Take 325-650 mg by mouth every 6 (six) hours as needed for pain (back pain).      Marland Kitchen aspirin 81 MG EC tablet Take 1 tablet (81 mg total) by mouth daily. Swallow whole.  30 tablet  12  . atorvastatin (LIPITOR) 10 MG tablet Take 1 tablet (10 mg total) by mouth daily at 6 PM.  30 tablet  6  . clobetasol cream (TEMOVATE) 0.05 % Apply 1 application topically 2 (two) times daily.      . clonazePAM (KLONOPIN) 0.5 MG tablet Take 0.5 mg by mouth at bedtime as needed.       . fish oil-omega-3 fatty acids 1000 MG capsule Take 2 g by mouth daily.       Marland Kitchen HYDROcodone-acetaminophen (NORCO/VICODIN) 5-325 MG per tablet Take 1 tablet by mouth 2 (two) times daily.       . Hypromellose (ARTIFICIAL TEARS OP) Place 2 drops into both eyes daily as needed (dry eyes).      Marland Kitchen losartan-hydrochlorothiazide (HYZAAR) 50-12.5 MG per tablet Take 1 tablet by mouth daily.      . metoprolol succinate (TOPROL XL) 25 MG 24 hr tablet Take 0.5 tablets (12.5 mg total) by mouth daily.  15 tablet  6  . Multiple Vitamins-Minerals (CENTRUM SILVER PO) Take 1 tablet by mouth daily.      Marland Kitchen  PARoxetine (PAXIL) 40 MG tablet Take 40 mg by mouth daily.      Marland Kitchen Ustekinumab (STELARA) 45 MG/0.5ML SOLN Inject 45 mg into the skin See admin instructions. Inject 45 mg under the skin under the skin week 0, then week 4, followed by 45 mg every 12 weeks thereafter       No current facility-administered medications for this visit.    Past Medical History  Diagnosis Date  . Anxiety   . Diabetes mellitus     Borderline  . Hyperlipidemia   . Anxiety   . DDD (degenerative disc disease), lumbar   . Psoriatic arthritis   . Nephrolithiasis   . Essential hypertension, benign   . Depression   . Takotsubo cardiomyopathy     LVEF 40-45% June 2014  . Coronary atherosclerosis of native coronary artery     Nonobstructive at catheterization June 2014    Social History Jennifer [name] reports that she has never smoked. She does not have any smokeless tobacco history on file. Jennifer [name] reports that she does not drink alcohol.  Review of Systems No chest pain, palpitations, progressive dyspnea and exertion. Otherwise negative.  Physical Examination Filed Vitals:   07/17/13 0957  BP: 142/67  Pulse: 56   Filed Weights   07/17/13 0957  Weight: 120 lb 1.9 oz (54.486 kg)  Appears comfortable.  HEENT: Conjunctiva and lids normal, oropharynx clear.  Neck: Supple, no elevated JVP or carotid bruits, no thyromegaly.  Lungs: Clear to auscultation, nonlabored breathing at rest.  Cardiac: Regular rate and rhythm, no S3 or significant systolic murmur, no pericardial rub.  Abdomen: Brace in place. Extremities: No pitting edema, distal pulses 2+.  Skin: Warm and dry.  Musculoskeletal: No kyphosis.  Neuropsychiatric: Alert and oriented x3, affect grossly appropriate.   Problem List and Plan   Takotsubo syndrome Symptomatically stable with documented normalization of LVEF by last echocardiogram on medical therapy. Continue observation.  Coronary atherosclerosis of native coronary  artery Nonobstructive by cardiac catheterization in June 2014. No active angina. Continue medical therapy which includes aspirin and statin.  HLD (hyperlipidemia) LDL 64 on low-dose Lipitor. Followup FLP and LFT for next visit in 6 months.  Essential hypertension, benign Blood pressure mildly elevated today. Keep follow with Dr. Sherryll BurgerShah. Sodium restriction discussed, also increasing activity.    Jonelle SidleSamuel G. Montrey Buist, M.D., F.A.C.C.

## 2013-07-17 ENCOUNTER — Encounter: Payer: Self-pay | Admitting: Cardiology

## 2013-07-17 ENCOUNTER — Ambulatory Visit (INDEPENDENT_AMBULATORY_CARE_PROVIDER_SITE_OTHER): Payer: Medicare Other | Admitting: Cardiology

## 2013-07-17 VITALS — BP 142/67 | HR 56 | Ht 60.0 in | Wt 120.1 lb

## 2013-07-17 DIAGNOSIS — I10 Essential (primary) hypertension: Secondary | ICD-10-CM

## 2013-07-17 DIAGNOSIS — E785 Hyperlipidemia, unspecified: Secondary | ICD-10-CM

## 2013-07-17 DIAGNOSIS — I5181 Takotsubo syndrome: Secondary | ICD-10-CM

## 2013-07-17 DIAGNOSIS — I2510 Atherosclerotic heart disease of native coronary artery without angina pectoris: Secondary | ICD-10-CM

## 2013-07-17 MED ORDER — ATORVASTATIN CALCIUM 10 MG PO TABS: 10.0000 mg | ORAL_TABLET | Freq: Every day | ORAL | Status: DC

## 2013-07-17 MED ORDER — METOPROLOL SUCCINATE ER 25 MG PO TB24: 12.5000 mg | ORAL_TABLET | Freq: Every day | ORAL | Status: DC

## 2013-07-17 NOTE — Patient Instructions (Addendum)
Your physician recommends that you schedule a follow-up appointment in: 6 months. You will receive a reminder letter in the mail in about 4 months reminding you to call and schedule your appointment. If you don't receive this letter, please contact our office. Your physician recommends that you continue on your current medications as directed. Please refer to the Current Medication list given to you today. Your physician recommends that you have a FASTING lipid/liver profile in 6 months just before your next visit. You will be contacted when this is due.

## 2013-07-17 NOTE — Assessment & Plan Note (Signed)
Nonobstructive by cardiac catheterization in June 2014. No active angina. Continue medical therapy which includes aspirin and statin.

## 2013-07-17 NOTE — Assessment & Plan Note (Signed)
LDL 64 on low-dose Lipitor. Followup FLP and LFT for next visit in 6 months.

## 2013-07-17 NOTE — Assessment & Plan Note (Signed)
Blood pressure mildly elevated today. Keep follow with Dr. Sherryll BurgerShah. Sodium restriction discussed, also increasing activity.

## 2013-07-17 NOTE — Assessment & Plan Note (Signed)
Symptomatically stable with documented normalization of LVEF by last echocardiogram on medical therapy. Continue observation.

## 2013-11-05 ENCOUNTER — Other Ambulatory Visit: Payer: Self-pay | Admitting: *Deleted

## 2013-11-05 DIAGNOSIS — Z79899 Other long term (current) drug therapy: Secondary | ICD-10-CM

## 2013-11-05 DIAGNOSIS — E785 Hyperlipidemia, unspecified: Secondary | ICD-10-CM

## 2013-11-05 NOTE — Progress Notes (Signed)
Patient request lab orders be mailed. Mailed today.

## 2014-01-01 ENCOUNTER — Telehealth: Payer: Self-pay | Admitting: Cardiology

## 2014-01-01 MED ORDER — ATORVASTATIN CALCIUM 10 MG PO TABS: 10.0000 mg | ORAL_TABLET | Freq: Every day | ORAL | Status: DC

## 2014-01-01 MED ORDER — METOPROLOL SUCCINATE ER 25 MG PO TB24: 12.5000 mg | ORAL_TABLET | Freq: Every day | ORAL | Status: DC

## 2014-01-01 NOTE — Telephone Encounter (Signed)
metoprolol succinate (TOPROL XL) 25 MG 24 hr tablet   atorvastatin (LIPITOR) 10 MG tablet   [facility] in [place]

## 2014-01-03 ENCOUNTER — Encounter: Payer: Self-pay | Admitting: Cardiology

## 2014-01-15 ENCOUNTER — Ambulatory Visit: Payer: Medicare Other | Admitting: Cardiology

## 2014-01-29 ENCOUNTER — Ambulatory Visit (INDEPENDENT_AMBULATORY_CARE_PROVIDER_SITE_OTHER): Payer: Medicare Other | Admitting: Cardiology

## 2014-01-29 ENCOUNTER — Encounter: Payer: Self-pay | Admitting: Cardiology

## 2014-01-29 VITALS — BP 145/76 | HR 55 | Ht 60.0 in | Wt 121.0 lb

## 2014-01-29 DIAGNOSIS — I10 Essential (primary) hypertension: Secondary | ICD-10-CM

## 2014-01-29 DIAGNOSIS — I2510 Atherosclerotic heart disease of native coronary artery without angina pectoris: Secondary | ICD-10-CM

## 2014-01-29 DIAGNOSIS — R413 Other amnesia: Secondary | ICD-10-CM

## 2014-01-29 MED ORDER — ATORVASTATIN CALCIUM 10 MG PO TABS: ORAL_TABLET | ORAL | Status: DC

## 2014-01-29 NOTE — Assessment & Plan Note (Signed)
Symptomatically stable, normalization of LVEF on medical therapy as outlined above.

## 2014-01-29 NOTE — Assessment & Plan Note (Signed)
Continue current regimen, keep follow up with Dr. Sherryll BurgerShah.

## 2014-01-29 NOTE — Progress Notes (Signed)
Clinical Summary Jennifer [name] is a 73 y.o.female last seen in January. She is here with her brother today. She reports no chest pain, no shortness of breath beyond NYHA class II. She reports compliance with her medications. Her main complaint is of a perception of memory loss over a period of several months. Her brother confirms this, not entirely certain how long it had been going on however. She states she cannot recall certain places that she used to be able to recall. She states she has not mentioned this to Dr. Sherryll Burger.  Echocardiogram from September 2014 showed normal LVEF 60-65% without regional wall motion abnormalities, grade 1 diastolic dysfunction, PASP 25 mm mercury.   Lab work in May showed normal LFTs, triglycerides 62, cholesterol 136, HDL 51, LDL 73. She continues on low-dose Lipitor.  ECG today shows sinus rhythm with left axis and increased voltage.  No Known Allergies  Current Outpatient Prescriptions  Medication Sig Dispense Refill  . acetaminophen (TYLENOL) 325 MG tablet Take 325-650 mg by mouth every 6 (six) hours as needed for pain (back pain).      Marland Kitchen aspirin 81 MG EC tablet Take 1 tablet (81 mg total) by mouth daily. Swallow whole.  30 tablet  12  . atorvastatin (LIPITOR) 10 MG tablet ON HOLD STARTING 01/29/14--Call the office in 1 MONTH to let us know if memory has improved  30 tablet  6  . clobetasol cream (TEMOVATE) 0.05 % Apply 1 application topically 2 (two) times daily.      . clonazePAM (KLONOPIN) 0.5 MG tablet Take 0.5 mg by mouth at bedtime as needed.       . fish oil-omega-3 fatty acids 1000 MG capsule Take 2 g by mouth daily.       Marland Kitchen HYDROcodone-acetaminophen (NORCO/VICODIN) 5-325 MG per tablet Take 1 tablet by mouth 2 (two) times daily.       . Hypromellose (ARTIFICIAL TEARS OP) Place 2 drops into both eyes daily as needed (dry eyes).      Marland Kitchen losartan-hydrochlorothiazide (HYZAAR) 50-12.5 MG per tablet Take 1 tablet by mouth daily.      . metoprolol succinate  (TOPROL XL) 25 MG 24 hr tablet Take 0.5 tablets (12.5 mg total) by mouth daily.  15 tablet  6  . Multiple Vitamins-Minerals (CENTRUM SILVER PO) Take 1 tablet by mouth daily.      Marland Kitchen PARoxetine (PAXIL) 40 MG tablet Take 40 mg by mouth daily.      Marland Kitchen Ustekinumab (STELARA) 45 MG/0.5ML SOLN Inject 45 mg into the skin See admin instructions. Inject 45 mg under the skin under the skin week 0, then week 4, followed by 45 mg every 12 weeks thereafter       No current facility-administered medications for this visit.    Past Medical History  Diagnosis Date  . Anxiety   . Diabetes mellitus     Borderline  . Hyperlipidemia   . Anxiety   . DDD (degenerative disc disease), lumbar   . Psoriatic arthritis   . Nephrolithiasis   . Essential hypertension, benign   . Depression   . Takotsubo cardiomyopathy     LVEF 60-65% September 2014  . Coronary atherosclerosis of native coronary artery     Nonobstructive at catheterization June 2014    Social History Jennifer [name] reports that she has never smoked. She does not have any smokeless tobacco history on file. Jennifer [name] reports that she does not drink alcohol.  Review of Systems Chronic back problems/pain.  Other systems reviewed and negative.  Physical Examination Filed Vitals:   01/29/14 1522  BP: 145/76  Pulse: 55   Filed Weights   01/29/14 1522  Weight: 121 lb (54.885 kg)    Appears comfortable.  HEENT: Conjunctiva and lids normal, oropharynx clear.  Neck: Supple, no elevated JVP or carotid bruits, no thyromegaly.  Lungs: Clear to auscultation, nonlabored breathing at rest.  Cardiac: Regular rate and rhythm, no S3 or significant systolic murmur, no pericardial rub.  Abdomen: Brace in place.  Extremities: No pitting edema, distal pulses 2+.  Skin: Warm and dry.  Musculoskeletal: No kyphosis.  Neuropsychiatric: Alert and oriented x3, affect grossly appropriate.   Problem List and Plan   Takotsubo syndrome Symptomatically stable,  normalization of LVEF on medical therapy as outlined above.  Coronary atherosclerosis of native coronary artery No angina symptoms, nonobstructive disease documented at cardiac catheterization in the setting of stress induced cardiomyopathy. She has continued on aspirin and statin.  Essential hypertension, benign Continue current regimen, keep follow up with Dr. Sherryll BurgerShah.  Memory loss Duration not entirely certain. I reviewed her medications. We will stop Lipitor for now to see if there is any potential correlation. She is to call us back over the next month. I asked that she discuss her symptoms with Dr. Sherryll BurgerShah as well.    Jonelle SidleSamuel G. Tiajah Oyster, M.D., F.A.C.C.

## 2014-01-29 NOTE — Patient Instructions (Signed)
Your physician has recommended you make the following change in your medication:  Please do not take Lipitor (Atorvastatin) at this time. Call the office in 1 MONTH to let us know if you have had any improvement in your memory.   Your physician wants you to follow-up in: 6 MONTHS with Dr Diona BrownerMcDowell.  You will receive a reminder letter in the mail two months in advance. If you don't receive a letter, please call our office to schedule the follow-up appointment.

## 2014-01-29 NOTE — Assessment & Plan Note (Addendum)
No angina symptoms, nonobstructive disease documented at cardiac catheterization in the setting of stress induced cardiomyopathy. She has continued on aspirin and statin.

## 2014-01-29 NOTE — Assessment & Plan Note (Signed)
Duration not entirely certain. I reviewed her medications. We will stop Lipitor for now to see if there is any potential correlation. She is to call us back over the next month. I asked that she discuss her symptoms with Dr. Sherryll BurgerShah as well.

## 2014-02-26 ENCOUNTER — Telehealth: Payer: Self-pay | Admitting: *Deleted

## 2014-02-26 NOTE — Telephone Encounter (Signed)
Per husband, he can't tell any difference with patient's memory with being off lipitor. Per husband, patient is going to restart lipitor.

## 2014-05-30 ENCOUNTER — Encounter (HOSPITAL_COMMUNITY): Payer: Self-pay | Admitting: Cardiovascular Disease

## 2014-07-05 ENCOUNTER — Other Ambulatory Visit: Payer: Self-pay | Admitting: *Deleted

## 2014-07-05 MED ORDER — METOPROLOL SUCCINATE ER 25 MG PO TB24: 12.5000 mg | ORAL_TABLET | Freq: Every day | ORAL | Status: DC

## 2014-07-05 NOTE — Telephone Encounter (Signed)
Metoprolol refilled.

## 2014-08-08 ENCOUNTER — Ambulatory Visit (INDEPENDENT_AMBULATORY_CARE_PROVIDER_SITE_OTHER): Payer: Medicare Other | Admitting: Cardiology

## 2014-08-08 ENCOUNTER — Encounter: Payer: Self-pay | Admitting: Cardiology

## 2014-08-08 VITALS — BP 128/70 | HR 60 | Ht 60.0 in | Wt 127.0 lb

## 2014-08-08 DIAGNOSIS — I2510 Atherosclerotic heart disease of native coronary artery without angina pectoris: Secondary | ICD-10-CM

## 2014-08-08 DIAGNOSIS — I10 Essential (primary) hypertension: Secondary | ICD-10-CM

## 2014-08-08 DIAGNOSIS — I5181 Takotsubo syndrome: Secondary | ICD-10-CM

## 2014-08-08 NOTE — Progress Notes (Signed)
Cardiology Office Note  Date: 08/08/2014   ID: Jennifer GripBrenda E Folmer, DOB 04/09/1941, MRN [id number]  PCP: Kirstie PeriSHAH,ASHISH, MD  Primary Cardiologist: Nona DellSamuel Kassem Kibbe, MD   Chief Complaint  Patient presents with  . Cardiomyopathy  . Coronary Artery Disease    History of Present Illness: Jennifer [name] is a 74 y.o. female last seen in August 2015. She presents for a routine visit today with her brother. Overall no change in cardiac status, specifically no angina, NYHA class II dyspnea, no palpitations or syncope. She continues on the medications outlined below which we reviewed.  At the last visit the patient was reporting memory deficits, confirmed by her brother. I recommended further evaluation by her primary care provider Dr. Sherryll BurgerShah, and we also tried a period of time off Lipitor, although this did not show any correlation with symptoms. Patient and her brother do not indicate any major changes in her memory since that time.  Today we discussed trying to get back to a regular walking regimen for exercise.   Past Medical History  Diagnosis Date  . Anxiety   . Diabetes mellitus     Borderline  . Hyperlipidemia   . Anxiety   . DDD (degenerative disc disease), lumbar   . Psoriatic arthritis   . Nephrolithiasis   . Essential hypertension, benign   . Depression   . Takotsubo cardiomyopathy     LVEF 60-65% September 2014  . Coronary atherosclerosis of native coronary artery     Nonobstructive at catheterization June 2014    Current Outpatient Prescriptions  Medication Sig Dispense Refill  . aspirin 81 MG EC tablet Take 1 tablet (81 mg total) by mouth daily. Swallow whole. 30 tablet 12  . atorvastatin (LIPITOR) 10 MG tablet ON HOLD STARTING 01/29/14--Call the office in 1 MONTH to let us know if memory has improved 30 tablet 6  . Biotin 5000 MCG CAPS Take 10,000 mcg by mouth.    . clobetasol cream (TEMOVATE) 0.05 % Apply 1 application topically 2 (two) times daily.    . fish  oil-omega-3 fatty acids 1000 MG capsule Take 2 g by mouth daily.     . Hypromellose (ARTIFICIAL TEARS OP) Place 2 drops into both eyes daily as needed (dry eyes).    Marland Kitchen. losartan-hydrochlorothiazide (HYZAAR) 50-12.5 MG per tablet Take 1 tablet by mouth daily.    . metoprolol succinate (TOPROL XL) 25 MG 24 hr tablet Take 0.5 tablets (12.5 mg total) by mouth daily. 45 tablet 3  . Multiple Vitamins-Minerals (CENTRUM SILVER PO) Take 1 tablet by mouth daily.    Marland Kitchen. PARoxetine (PAXIL) 40 MG tablet Take 40 mg by mouth daily.    Marland Kitchen. UNABLE TO FIND TCA 0.1% CREAM PSORIASIS    . Ustekinumab (STELARA) 45 MG/0.5ML SOLN Inject 45 mg into the skin See admin instructions. Inject 45 mg under the skin under the skin week 0, then week 4, followed by 45 mg every 12 weeks thereafter     No current facility-administered medications for this visit.    Allergies:  Review of patient's allergies indicates no known allergies.   Social History: The patient  reports that she has never smoked. She has never used smokeless tobacco. She reports that she does not drink alcohol or use illicit drugs.   ROS:  Please see the history of present illness. Otherwise, complete review of systems is positive for some trouble with memory, specifically recalling directions or other details at times.  All other systems are reviewed and  negative.    Physical Exam: VS:  BP 128/70 mmHg  Pulse 60  Ht 5' (1.524 m)  Wt 127 lb (57.607 kg)  BMI 24.80 kg/m2, BMI Body mass index is 24.8 kg/(m^2).  Wt Readings from Last 3 Encounters:  08/08/14 127 lb (57.607 kg)  01/29/14 121 lb (54.885 kg)  07/17/13 120 lb 1.9 oz (54.486 kg)     Appears comfortable.  HEENT: Conjunctiva and lids normal, oropharynx clear.  Neck: Supple, no elevated JVP or carotid bruits, no thyromegaly.  Lungs: Clear to auscultation, nonlabored breathing at rest.  Cardiac: Regular rate and rhythm, no S3 or significant systolic murmur, no pericardial rub.  Abdomen: Brace  in place.  Extremities: No pitting edema, distal pulses 2+.  Skin: Warm and dry.  Musculoskeletal: No kyphosis.  Neuropsychiatric: Alert and oriented x3, affect grossly appropriate.   ECG: ECG is not ordered today.   Recent Labwork:  Lab work from May 2015 showed AST 30, ALT 32, cholesterol 136, triglycerides 62, HDL 51, and LDL 73.  Other Studies Reviewed Today:  Echocardiogram from September 2014 showed normal LVEF 60-65% without regional wall motion abnormalities, grade 1 diastolic dysfunction, PASP 25 mm mercury.   Assessment and Plan:  1. History of Takotsubo cardiomyopathy with subsequent normalization of LVEF on medical therapy. She reports stable NYHA class II dyspnea and no active heart failure symptoms. No changes made today.  2. History of nonobstructive CAD by cardiac catheterization 2014, no active angina symptoms. She continues on aspirin, Lipitor, Toprol-XL, and Hyzaar.  3. Essential hypertension, blood pressure well controlled today.  Current medicines are reviewed at length with the patient today.  The patient does not have concerns regarding medicines.  Disposition: FU with me in 6 months.   Signed, Jonelle Sidle, MD, [facility] 08/08/2014 10:28 AM    [facility] at [facility] [address] [place], [place], [place] [address] Phone: [phone]; Fax: [phone]

## 2014-08-08 NOTE — Patient Instructions (Signed)

## 2014-10-08 ENCOUNTER — Other Ambulatory Visit: Payer: Self-pay | Admitting: *Deleted

## 2014-10-08 DIAGNOSIS — I10 Essential (primary) hypertension: Secondary | ICD-10-CM

## 2014-10-08 MED ORDER — ATORVASTATIN CALCIUM 10 MG PO TABS: 10.0000 mg | ORAL_TABLET | Freq: Every day | ORAL | Status: DC

## 2015-01-06 ENCOUNTER — Ambulatory Visit (INDEPENDENT_AMBULATORY_CARE_PROVIDER_SITE_OTHER): Payer: Medicare Other | Admitting: Neurology

## 2015-01-06 ENCOUNTER — Other Ambulatory Visit: Payer: Medicare Other

## 2015-01-06 ENCOUNTER — Encounter: Payer: Self-pay | Admitting: Neurology

## 2015-01-06 VITALS — BP 124/62 | HR 54 | Resp 16 | Ht 60.0 in | Wt 131.1 lb

## 2015-01-06 DIAGNOSIS — I2510 Atherosclerotic heart disease of native coronary artery without angina pectoris: Secondary | ICD-10-CM | POA: Diagnosis not present

## 2015-01-06 DIAGNOSIS — F0280 Dementia in other diseases classified elsewhere without behavioral disturbance: Secondary | ICD-10-CM | POA: Diagnosis not present

## 2015-01-06 DIAGNOSIS — G309 Alzheimer's disease, unspecified: Principal | ICD-10-CM

## 2015-01-06 DIAGNOSIS — R404 Transient alteration of awareness: Secondary | ICD-10-CM | POA: Diagnosis not present

## 2015-01-06 NOTE — Patient Instructions (Addendum)
1.  We will start memantine ER (Namenda XR)  tablets.  We will increase dose to goal of  daily, as per the following schedule.  Start 1 tablet daily for one week, then 2 tablets daily for one week, then 3 tablets daily for one week.  At that point, call the clinic and we can prescribe a larger single dose tablet of , to be taken daily.  Side effects include dizziness, headache, diarrhea or constipation.  Call with any questions or concerns.  Call in 4 weeks and if tolerated, we will start another medication for memory, called Aricept 2.  Check EEG 3.  Check B12 and TSH. 4.  No driving until re-examination in 6 months. 5.  Follow up in 6 months.

## 2015-01-06 NOTE — Progress Notes (Addendum)
NEUROLOGY CONSULTATION NOTE  Jennifer [name] MRN: [id number] DOB: [birth year]  Referring provider: Dr. Sherryll Burger Primary care provider: Dr. Sherryll Burger  Reason for consult:  TIA  HISTORY OF PRESENT ILLNESS: Jennifer [name] is a 74 year old right-handed woman with hypertension, Takotsubo cardiomyopathy, and hyperlipidemia who presents for TIA and memory problems.  She is accompanied by her brother who provides some history.  Images of brain MRI and carotid doppler report reviewed.  She began having noticeable memory problems in late 2014 after some medical issues, such as kidney stones, MI and back surgery.  She began having problems remembering how to get to familiar places, such as the dentist or friend's house.  She also reports difficulty remembering names of people she has known for years.  She denies difficulty recognizing faces.  She denies hallucinations, poor sleep or depression.  She has not exhibited episodes of agitation or acute confusion.  Her brother recently started living with her.  Her son lives next door.  She still pays her bills but her brother will double check her calculations which are sometimes incorrect.  She very rarely drives now, but only to her friend's house or the store, when she needs to.  She performs all of her ADLs and keeps the house tidy.  She exhibits no change in behavior or personality.  In early May, she had an episode of unresponsiveness lasting several minutes.  She was with her son when suddenly her head dropped with eyes still open but zoned out.  She was unresponsive and did not talk.  She did not exhibit any twitching or incontinence.  She had an MRI of the brain performed on 11/13/14 which showed age appropriate atrophy and mild chronic small vessel disease.  Carotid doppler performed on 11/25/14 showed less than 50% bilateral ICA stenosis.  As per her brother, she has had two other similar spells last year, only lasting about 30 seconds.  Both parents died in  their 17s and had no history of dementia.  Labs from May 2016 showed LDL 50 and Hgb A1c of 6.1%.  PAST MEDICAL HISTORY: Past Medical History  Diagnosis Date  . Anxiety   . Diabetes mellitus     Borderline  . Hyperlipidemia   . Anxiety   . DDD (degenerative disc disease), lumbar   . Psoriatic arthritis   . Nephrolithiasis   . Essential hypertension, benign   . Depression   . Takotsubo cardiomyopathy     LVEF 60-65% September 2014  . Coronary atherosclerosis of native coronary artery     Nonobstructive at catheterization June 2014    PAST SURGICAL HISTORY: Past Surgical History  Procedure Laterality Date  . Appendectomy    . Cholecystectomy    . Left heart catheterization with coronary angiogram N/A 12/18/2012    Procedure: LEFT HEART CATHETERIZATION WITH CORONARY ANGIOGRAM;  Surgeon: Tonny Bollman, MD;  Location: [facility] CATH LAB;  Service: Cardiovascular;  Laterality: N/A;    MEDICATIONS: Current Outpatient Prescriptions on File Prior to Visit  Medication Sig Dispense Refill  . aspirin 81 MG EC tablet Take 1 tablet (81 mg total) by mouth daily. Swallow whole. 30 tablet 12  . atorvastatin (LIPITOR) 10 MG tablet Take 1 tablet (10 mg total) by mouth daily at 6 PM. 30 tablet 6  . Biotin 5000 MCG CAPS Take 10,000 mcg by mouth.    . clobetasol cream (TEMOVATE) 0.05 % Apply 1 application topically 2 (two) times daily.    . fish oil-omega-3 fatty acids 1000  MG capsule Take 2 g by mouth daily.     . Hypromellose (ARTIFICIAL TEARS OP) Place 2 drops into both eyes daily as needed (dry eyes).    Marland Kitchen. losartan-hydrochlorothiazide (HYZAAR) 50-12.5 MG per tablet Take 1 tablet by mouth daily.    . metoprolol succinate (TOPROL XL) 25 MG 24 hr tablet Take 0.5 tablets (12.5 mg total) by mouth daily. 45 tablet 3  . Multiple Vitamins-Minerals (CENTRUM SILVER PO) Take 1 tablet by mouth daily.    Marland Kitchen. PARoxetine (PAXIL) 40 MG tablet Take 40 mg by mouth daily.    Marland Kitchen. UNABLE TO FIND TCA 0.1% CREAM PSORIASIS     . Ustekinumab (STELARA) 45 MG/0.5ML SOLN Inject 45 mg into the skin See admin instructions. Inject 45 mg under the skin under the skin week 0, then week 4, followed by 45 mg every 12 weeks thereafter     No current facility-administered medications on file prior to visit.    ALLERGIES: No Known Allergies  FAMILY HISTORY: Family History  Problem Relation Age of Onset  . CAD Father   . CAD Mother   . Hypertension Mother   . Breast cancer Mother   . Hypertension Brother      X 2  . Cancer Maternal Grandmother     breast cancer     SOCIAL HISTORY: History   Social History  . Marital Status: Single    Spouse Name: N/A  . Number of Children: N/A  . Years of Education: N/A   Occupational History  . Not on file.   Social History Main Topics  . Smoking status: Never Smoker   . Smokeless tobacco: Never Used  . Alcohol Use: No  . Drug Use: No  . Sexual Activity: Not Currently   Other Topics Concern  . Not on file   Social History Narrative    REVIEW OF SYSTEMS: Constitutional: No fevers, chills, or sweats, no generalized fatigue, change in appetite Eyes: No visual changes, double vision, eye pain Ear, nose and throat: No hearing loss, ear pain, nasal congestion, sore throat Cardiovascular: No chest pain, palpitations Respiratory:  No shortness of breath at rest or with exertion, wheezes GastrointestinaI: No nausea, vomiting, diarrhea, abdominal pain, fecal incontinence Genitourinary:  No dysuria, urinary retention or frequency Musculoskeletal:  No neck pain, back pain Integumentary: No rash, pruritus, skin lesions Neurological: as above Psychiatric: No depression, insomnia, anxiety Endocrine: No palpitations, fatigue, diaphoresis, mood swings, change in appetite, change in weight, increased thirst Hematologic/Lymphatic:  No anemia, purpura, petechiae. Allergic/Immunologic: no itchy/runny eyes, nasal congestion, recent allergic reactions, rashes  PHYSICAL  EXAM: Filed Vitals:   01/06/15 0841  BP: 124/62  Pulse: 54  Resp: 16   General: No acute distress.  Patient appears well-groomed.   Head:  Normocephalic/atraumatic Eyes:  fundi unremarkable, without vessel changes, exudates, hemorrhages or papilledema. Neck: supple, no paraspinal tenderness, full range of motion Back: No paraspinal tenderness Heart: regular rate and rhythm Lungs: Clear to auscultation bilaterally. Vascular: No carotid bruits. Neurological Exam: Mental status: alert and oriented to person, place, not time (said year was 1961), delayed recall poor, remote memory intact, fund of knowledge fair, attention and concentration intact, speech fluent and not dysarthric, language intact. Montreal Cognitive Assessment  01/06/2015  Visuospatial/ Executive (0/5) 3  Naming (0/3) 3  Attention: Read list of digits (0/2) 2  Attention: Read list of letters (0/1) 1  Attention: Serial 7 subtraction starting at 100 (0/3) 2  Language: Repeat phrase (0/2) 2  Language : Fluency (  0/1) 0  Abstraction (0/2) 2  Delayed Recall (0/5) 0  Orientation (0/6) 4  Total 19  Adjusted Score (based on education) 20   Cranial nerves: CN I: not tested CN II: Left surgical pupil.  Right pupil round and reactive to light, visual fields intact, fundi unremarkable, without vessel changes, exudates, hemorrhages or papilledema. CN III, IV, VI:  full range of motion, no nystagmus, no ptosis CN V: facial sensation intact CN VII: upper and lower face symmetric CN VIII: hearing intact CN IX, X: gag intact, uvula midline CN XI: sternocleidomastoid and trapezius muscles intact CN XII: tongue midline Bulk & Tone: normal, no fasciculations. Motor:  5/5 throughout Sensation:  Temperature and vibration intact Deep Tendon Reflexes:  2+t throughout, toes downgoing Finger to nose testing:  intact Heel to shin:  intact Gait:  Normal station and stride.  Able to turn.  Mildly unsteady tandem walk. Romberg  negative.  IMPRESSION: Alzheimer's disease Transient altered awareness.  I don't suspect TIA.  It is a recurrent habitual event and MRI is unremarkable for significant small vessel disease.  Behavioral verses seizure are possibility.  PLAN: 1.  We will start memantine ER (Namenda XR) and titrate to  daily.  After one month, if tolerating, will start Aricept as well 2.  Check EEG 3.  Check B12 and TSH. 4.  No driving until re-examination in 6 months. 5.  Follow up in 6 months.  Thank you for allowing me to take part in the care of this patient.  Shon Millet, DO  CC:  Kirstie Peri, MD

## 2015-01-07 ENCOUNTER — Telehealth: Payer: Self-pay | Admitting: *Deleted

## 2015-01-07 LAB — VITAMIN B12: VITAMIN B 12: 764 pg/mL (ref 211–911)

## 2015-01-07 LAB — TSH: TSH: 3.531 u[IU]/mL (ref 0.350–4.500)

## 2015-01-07 NOTE — Telephone Encounter (Signed)
-----   Message from Drema DallasAdam R Jaffe, DO sent at 01/07/2015  6:56 AM EDT ----- Labs ok

## 2015-01-07 NOTE — Telephone Encounter (Signed)
Patient is aware that   Labs ok

## 2015-01-15 ENCOUNTER — Ambulatory Visit (INDEPENDENT_AMBULATORY_CARE_PROVIDER_SITE_OTHER): Payer: Medicare Other | Admitting: Neurology

## 2015-01-15 DIAGNOSIS — G309 Alzheimer's disease, unspecified: Secondary | ICD-10-CM

## 2015-01-15 DIAGNOSIS — R404 Transient alteration of awareness: Secondary | ICD-10-CM

## 2015-01-15 DIAGNOSIS — F0280 Dementia in other diseases classified elsewhere without behavioral disturbance: Secondary | ICD-10-CM

## 2015-01-20 ENCOUNTER — Telehealth: Payer: Self-pay | Admitting: *Deleted

## 2015-01-20 NOTE — Telephone Encounter (Signed)
Patient is aware that EEG has not been resulted to me yet .

## 2015-01-21 ENCOUNTER — Ambulatory Visit (INDEPENDENT_AMBULATORY_CARE_PROVIDER_SITE_OTHER): Payer: Medicare Other | Admitting: Cardiology

## 2015-01-21 ENCOUNTER — Encounter: Payer: Self-pay | Admitting: Cardiology

## 2015-01-21 VITALS — BP 145/68 | HR 51 | Ht 60.0 in | Wt 131.4 lb

## 2015-01-21 DIAGNOSIS — I5181 Takotsubo syndrome: Secondary | ICD-10-CM

## 2015-01-21 DIAGNOSIS — E785 Hyperlipidemia, unspecified: Secondary | ICD-10-CM | POA: Diagnosis not present

## 2015-01-21 DIAGNOSIS — I2510 Atherosclerotic heart disease of native coronary artery without angina pectoris: Secondary | ICD-10-CM

## 2015-01-21 NOTE — Progress Notes (Signed)
Cardiology Office Note  Date: 01/21/2015   ID: Jennifer [name], DOB 25-Jan-1941, MRN [id number]  PCP: Kirstie Peri, MD  Primary Cardiologist: Nona Dell, MD   Chief Complaint  Patient presents with  . Cardiomyopathy  . Coronary Artery Disease    History of Present Illness: Jennifer [name] is a 74 y.o. female last seen in February. She presents with her brother for a routine follow-up visit. From a cardiac perspective, she remains stable without chest pain or increasing shortness of breath. Follow-up ECG is reviewed below. There has been no change in her cardiac medical regimen which we reviewed today as well. She states that she goes up to the local mall and walks indoors at least 2 days a week for exercise.  Interval records reviewed, she was seen recently by neurology with diagnosis of Alzheimer's disease, also evaluating other transient altered awareness with additional testing. She was started on Namenda XR with plan to initiate Aricept eventually.   Past Medical History  Diagnosis Date  . Anxiety   . Diabetes mellitus     Borderline  . Hyperlipidemia   . Anxiety   . DDD (degenerative disc disease), lumbar   . Psoriatic arthritis   . Nephrolithiasis   . Essential hypertension, benign   . Depression   . Takotsubo cardiomyopathy     LVEF 60-65% September 2014  . Coronary atherosclerosis of native coronary artery     Nonobstructive at catheterization June 2014    Current Outpatient Prescriptions  Medication Sig Dispense Refill  . aspirin 81 MG EC tablet Take 1 tablet (81 mg total) by mouth daily. Swallow whole. 30 tablet 12  . atorvastatin (LIPITOR) 10 MG tablet Take 1 tablet (10 mg total) by mouth daily at 6 PM. 30 tablet 6  . Biotin 5000 MCG CAPS Take 10,000 mcg by mouth daily.     . clobetasol cream (TEMOVATE) 0.05 % Apply 1 application topically 2 (two) times daily.    Marland Kitchen desonide (DESOWEN) 0.05 % ointment Apply 1 application topically as needed.     . fish  oil-omega-3 fatty acids 1000 MG capsule Take 2 g by mouth daily.     Marland Kitchen gabapentin (NEURONTIN) 100 MG capsule Take 100 mg by mouth daily.     . Hypromellose (ARTIFICIAL TEARS OP) Place 2 drops into both eyes daily as needed (dry eyes).    Marland Kitchen losartan-hydrochlorothiazide (HYZAAR) 50-12.5 MG per tablet Take 1 tablet by mouth daily.    . Memantine HCl ER (NAMENDA XR TITRATION PACK) 7 & 14 & 21 &28 MG CP24 Take by mouth daily.     . metoprolol succinate (TOPROL XL) 25 MG 24 hr tablet Take 0.5 tablets (12.5 mg total) by mouth daily. 45 tablet 3  . Multiple Vitamins-Minerals (CENTRUM SILVER PO) Take 1 tablet by mouth daily.    Marland Kitchen PARoxetine (PAXIL) 40 MG tablet Take 40 mg by mouth daily.    . tacrolimus (PROTOPIC) 0.1 % ointment as needed.     . triamcinolone lotion (KENALOG) 0.1 % Apply 1 application topically 2 (two) times daily as needed. With Silver Sulfadiazine 1%    . triamcinolone ointment (KENALOG) 0.1 % as needed.     Marland Kitchen Ustekinumab (STELARA) 45 MG/0.5ML SOLN Inject 45 mg into the skin See admin instructions. Inject 45 mg under the skin under the skin week 0, then week 4, followed by 45 mg every 12 weeks thereafter     No current facility-administered medications for this visit.    Allergies:  Review of patient's allergies indicates no known allergies.   Social History: The patient  reports that she has never smoked. She has never used smokeless tobacco. She reports that she does not drink alcohol or use illicit drugs.   ROS:  Please see the history of present illness. Otherwise, complete review of systems is positive for memory deficits.  All other systems are reviewed and negative.   Physical Exam: VS:  BP 145/68 mmHg  Pulse 51  Ht 5' (1.524 m)  Wt 131 lb 6.4 oz (59.603 kg)  BMI 25.66 kg/m2  SpO2 95%, BMI Body mass index is 25.66 kg/(m^2).  Wt Readings from Last 3 Encounters:  01/21/15 131 lb 6.4 oz (59.603 kg)  01/06/15 131 lb 1.6 oz (59.467 kg)  08/08/14 127 lb (57.607 kg)      Appears comfortable.  HEENT: Conjunctiva and lids normal, oropharynx clear.  Neck: Supple, no elevated JVP or carotid bruits, no thyromegaly.  Lungs: Clear to auscultation, nonlabored breathing at rest.  Cardiac: Regular rate and rhythm, no S3 or significant systolic murmur, no pericardial rub.  Abdomen: Brace in place.  Extremities: No pitting edema, distal pulses 2+.  Skin: Warm and dry.    ECG: ECG is ordered today and shows sinus bradycardia with left anterior fascicular block, decreased R wave progression, and increased voltage with repolarization abnormalities.   Recent Labwork: 01/06/2015: TSH 3.531   Other Studies Reviewed Today:  Echocardiogram from September 2014 showed normal LVEF 60-65% without regional wall motion abnormalities, grade 1 diastolic dysfunction, PASP 25 mm mercury.   Carotid Dopplers [facility]) 11/26/2014 reported less than 50% bilateral ICA stenoses.  Assessment and Plan:  1. History of Takotsubo cardiomyopathy with subsequent normalization of LVEF on medical therapy. She remains clinically stable. No changes were made today.  2. History of nonobstructive CAD by cardiac catheterization in 2014. No active angina symptoms. Continues on medical therapy including aspirin and statin.  3. Hyperlipidemia, she continues on low-dose Lipitor. Followed by Dr. Sherryll Burger.  Current medicines were reviewed with the patient today.   Orders Placed This Encounter  Procedures  . EKG 12-Lead    Disposition: FU with me in 6 months.   Signed, Jonelle Sidle, MD, [facility] 01/21/2015 10:35 AM    [facility] at [facility] [address] [place], [place], [place] [address] Phone: [phone]; Fax: [phone]

## 2015-01-21 NOTE — Patient Instructions (Signed)

## 2015-01-24 ENCOUNTER — Telehealth: Payer: Self-pay | Admitting: Family Medicine

## 2015-01-24 NOTE — Procedures (Signed)
ELECTROENCEPHALOGRAM REPORT  Date of Study: 01/15/2015  Patient's Name: Jennifer [name] MRN: [id number] Date of Birth: 1940-12-09  Referring Provider: Dr. Shon Millet  Clinical History: This is a 74 year old woman with episodes of unresponsiveness.  Medications: Aspirin, Toprol, Hyzaar, Paxil, Stelara  Technical Summary: A multichannel digital EEG recording measured by the international 10-20 system with electrodes applied with paste and impedances below 5000 ohms performed in our laboratory with EKG monitoring in an awake and asleep patient.  Hyperventilation was not performed. Photic stimulation was performed.  The digital EEG was referentially recorded, reformatted, and digitally filtered in a variety of bipolar and referential montages for optimal display.    Description: The patient is awake and asleep during the recording.  During maximal wakefulness, there is a symmetric, medium voltage 8 Hz posterior dominant rhythm that attenuates with eye opening.  The record is symmetric.  During drowsiness and sleep, there is an increase in theta slowing of the background.  Vertex waves and symmetric sleep spindles were seen.  Hyperventilation was not performed. Photic stimulation did not elicit any abnormalities.  There were no epileptiform discharges or electrographic seizures seen.    EKG lead was unremarkable.  Impression: This awake and asleep EEG is normal.    Clinical Correlation: A normal EEG does not exclude a clinical diagnosis of epilepsy. Clinical correlation is advised.   Patrcia Dolly, M.D.

## 2015-01-24 NOTE — Telephone Encounter (Signed)
Lmovm to return my call. 

## 2015-01-24 NOTE — Telephone Encounter (Signed)
-----   Message from Van Clines, MD sent at 01/24/2015  8:43 AM EDT ----- Regarding: results Pls let Dr. Moises Blood pt know that EEG is normal. Thanks

## 2015-01-24 NOTE — Telephone Encounter (Signed)
Patient returned my call. I notified her of result.

## 2015-01-27 ENCOUNTER — Telehealth: Payer: Self-pay | Admitting: *Deleted

## 2015-01-27 NOTE — Telephone Encounter (Signed)
-----   Message from Drema Dallas, DO sent at 01/26/2015  8:24 AM EDT ----- eeg is normal

## 2015-01-27 NOTE — Telephone Encounter (Signed)
Patient is aware EEG is normal  

## 2015-02-03 ENCOUNTER — Other Ambulatory Visit: Payer: Self-pay | Admitting: *Deleted

## 2015-02-03 ENCOUNTER — Telehealth: Payer: Self-pay | Admitting: *Deleted

## 2015-02-03 MED ORDER — MEMANTINE HCL ER 28 MG PO CP24: 28.0000 mg | ORAL_CAPSULE | Freq: Every day | ORAL | Status: DC

## 2015-02-03 MED ORDER — DONEPEZIL HCL 5 MG PO TABS: 5.0000 mg | ORAL_TABLET | Freq: Every day | ORAL | Status: DC

## 2015-02-03 NOTE — Telephone Encounter (Signed)
Patients brother Mr Hollice Espy called stating his sister has done well with the Namenda XR  other than she has been a little bit light headed and sleeping a bit more  Please advise if you want to add Aricept with strength and directions

## 2015-02-03 NOTE — Telephone Encounter (Signed)
RX for Namenda  28 mg # 30 with 3 refills called to pharmacy as well as aricepts 5 mg # 30 1 po qd  0 refills

## 2015-02-03 NOTE — Telephone Encounter (Signed)
Patients brother Mr. Hollice Espy was calling in reference to her Namenda XR. Patient has been on this for about 4 weeks and he would like to know some of the side affects of this medication before she refills it Call back number [phone].

## 2015-02-03 NOTE — Telephone Encounter (Signed)
Typical side effects of Namenda include dizziness, headache, diarrhea or constipation.  If she is doing well, then I would like to start Aricept.  We will start donepezil (Aricept)  daily for four weeks.  If you are tolerating the medication, then after four weeks, we will increase the dose to  daily.  Side effects include nausea, vomiting, diarrhea, vivid dreams, and muscle cramps.  Please call the clinic if you experience any of these symptoms.

## 2015-02-20 ENCOUNTER — Encounter (INDEPENDENT_AMBULATORY_CARE_PROVIDER_SITE_OTHER): Payer: Self-pay | Admitting: Ophthalmology

## 2015-04-09 ENCOUNTER — Other Ambulatory Visit: Payer: Self-pay | Admitting: Cardiology

## 2015-04-09 NOTE — Telephone Encounter (Signed)
Per recent office visit, lipids are monitored by pcp. Ok to refill or defer to their office? Please advise. Thanks, MI

## 2015-07-01 ENCOUNTER — Ambulatory Visit (INDEPENDENT_AMBULATORY_CARE_PROVIDER_SITE_OTHER): Payer: Medicare Other | Admitting: Cardiology

## 2015-07-01 ENCOUNTER — Encounter: Payer: Self-pay | Admitting: Cardiology

## 2015-07-01 VITALS — BP 118/72 | HR 56 | Ht 60.0 in | Wt 143.0 lb

## 2015-07-01 DIAGNOSIS — I2510 Atherosclerotic heart disease of native coronary artery without angina pectoris: Secondary | ICD-10-CM | POA: Diagnosis not present

## 2015-07-01 DIAGNOSIS — I5181 Takotsubo syndrome: Secondary | ICD-10-CM | POA: Diagnosis not present

## 2015-07-01 NOTE — Progress Notes (Signed)
Cardiology Office Note  Date: 07/01/2015   ID: Jennifer [name], DOB April 14, 1941, MRN [id number]  PCP: Kirstie PeriSHAH,ASHISH, MD  Primary Cardiologist: Nona DellSamuel Chrisha Vogel, MD   Chief Complaint  Patient presents with  . History of Takotsubo cardiomyopathy    History of Present Illness: Jennifer GripBrenda E [name] is a 75 y.o. female last seen in August 2016. She is here today with her son for a follow-up visit. She has had no reported problems with exertional chest pain or increasing shortness of breath. No orthopnea or PND.  I reviewed her medications. Cardiac regimen includes Toprol-XL, Hyzaar, and Lipitor.  Last echocardiogram is detailed below. We have continued observation with no significant changes in symptoms and prior documented normalization of LVEF.  Past Medical History  Diagnosis Date  . Anxiety   . Diabetes mellitus (HCC)     Borderline  . Hyperlipidemia   . Anxiety   . DDD (degenerative disc disease), lumbar   . Psoriatic arthritis (HCC)   . Nephrolithiasis   . Essential hypertension, benign   . Depression   . Takotsubo cardiomyopathy     LVEF 60-65% September 2014  . Coronary atherosclerosis of native coronary artery     Nonobstructive at catheterization June 2014    Current Outpatient Prescriptions  Medication Sig Dispense Refill  . aspirin 81 MG EC tablet Take 1 tablet (81 mg total) by mouth daily. Swallow whole. 30 tablet 12  . atorvastatin (LIPITOR) 10 MG tablet TAKE 1 TABLET AT 6:00 P.M. 30 tablet 3  . Biotin 5000 MCG CAPS Take 10,000 mcg by mouth daily.     . clobetasol cream (TEMOVATE) 0.05 % Apply 1 application topically 2 (two) times daily.    Marland Kitchen. desonide (DESOWEN) 0.05 % ointment Apply 1 application topically as needed.     . donepezil (ARICEPT) 5 MG tablet Take 1 tablet (5 mg total) by mouth at bedtime. 30 tablet 0  . fish oil-omega-3 fatty acids 1000 MG capsule Take 2 g by mouth daily.     Marland Kitchen. gabapentin (NEURONTIN) 100 MG capsule Take 100 mg by mouth daily.     .  Hypromellose (ARTIFICIAL TEARS OP) Place 2 drops into both eyes daily as needed (dry eyes).    . Ixekizumab (TALTZ [place]) Inject 80 mg into the skin every 30 (thirty) days.    Marland Kitchen. losartan-hydrochlorothiazide (HYZAAR) 50-12.5 MG per tablet Take 1 tablet by mouth daily.    . memantine (NAMENDA XR) 28 MG CP24 24 hr capsule Take 1 capsule (28 mg total) by mouth daily. 30 capsule 3  . Memantine HCl ER (NAMENDA XR TITRATION PACK) 7 & 14 & 21 &28 MG CP24 Take by mouth daily.     . metoprolol succinate (TOPROL XL) 25 MG 24 hr tablet Take 0.5 tablets (12.5 mg total) by mouth daily. 45 tablet 3  . Multiple Vitamins-Minerals (CENTRUM SILVER PO) Take 1 tablet by mouth daily.    Marland Kitchen. PARoxetine (PAXIL) 40 MG tablet Take 40 mg by mouth daily.    . tacrolimus (PROTOPIC) 0.1 % ointment as needed.     . triamcinolone lotion (KENALOG) 0.1 % Apply 1 application topically 2 (two) times daily as needed. With Silver Sulfadiazine 1%    . triamcinolone ointment (KENALOG) 0.1 % as needed.      No current facility-administered medications for this visit.   Allergies:  Review of patient's allergies indicates no known allergies.   Social History: The patient  reports that she has never smoked. She has never used  smokeless tobacco. She reports that she does not drink alcohol or use illicit drugs.   ROS:  Please see the history of present illness. Otherwise, complete review of systems is positive for improved psoriatic arthritis.  All other systems are reviewed and negative.   Physical Exam: VS:  BP 118/72 mmHg  Pulse 56  Ht 5' (1.524 m)  Wt 143 lb (64.864 kg)  BMI 27.93 kg/m2  SpO2 97%, BMI Body mass index is 27.93 kg/(m^2).  Wt Readings from Last 3 Encounters:  07/01/15 143 lb (64.864 kg)  01/21/15 131 lb 6.4 oz (59.603 kg)  01/06/15 131 lb 1.6 oz (59.467 kg)    Appears comfortable.  HEENT: Conjunctiva and lids normal, oropharynx clear.  Neck: Supple, no elevated JVP or carotid bruits, no thyromegaly.  Lungs:  Clear to auscultation, nonlabored breathing at rest.  Cardiac: Regular rate and rhythm, no S3 or significant systolic murmur, no pericardial rub.  Abdomen: NABS.  Extremities: No pitting edema, distal pulses 2+.   ECG: Tracing from 01/21/2015 showed sinus bradycardia with left anterior fascicular block, increased voltage, nonspecific T-wave changes.  Other Studies Reviewed Today:  Echocardiogram 02/28/2013: Study Conclusions  - Left ventricle: The cavity size was normal. Wall thickness was normal. Systolic function was normal. The estimated ejection fraction was in the range of 60% to 65%. Wall motion was normal; there were no regional wall motion abnormalities. Doppler parameters are consistent with abnormal left ventricular relaxation (grade 1 diastolic dysfunction). - Right atrium: Central venous pressure: 3mm Hg (est). - Tricuspid valve: Physiologic regurgitation. - Pulmonary arteries: PA peak pressure: 25mm Hg (S). - Pericardium, extracardiac: There was no pericardial effusion. Impressions:  - Comparison to previous study June 2014. There has been normalization of LV systolic function, LVEF now 60-65%. Grade 1 diastolic dysfunction. Normal PASP, no pericardial effusion.  Assessment and Plan:  1. Symptomatically stable with history of Takotsubo cardiomyopathy and normalization of LVEF on medical therapy. No changes made today.  2. Essential hypertension, blood pressure is normal today.  Current medicines were reviewed with the patient today.  Disposition: FU with me in 6 months.   Signed, Jonelle Sidle, MD, [facility] 07/01/2015 10:19 AM    [facility] at [facility] [address] [place], [place], [place] [address] Phone: [phone]; Fax: [phone]

## 2015-07-01 NOTE — Patient Instructions (Signed)
Your physician recommends that you continue on your current medications as directed. Please refer to the Current Medication list given to you today. Your physician recommends that you schedule a follow-up appointment in: 6 months. You will receive a reminder letter in the mail in about 4 months reminding you to call and schedule your appointment. If you don't receive this letter, please contact our office. 

## 2015-07-10 ENCOUNTER — Ambulatory Visit: Payer: Medicare Other | Admitting: Neurology

## 2015-07-28 DIAGNOSIS — L4050 Arthropathic psoriasis, unspecified: Secondary | ICD-10-CM | POA: Diagnosis not present

## 2015-07-28 DIAGNOSIS — L409 Psoriasis, unspecified: Secondary | ICD-10-CM | POA: Diagnosis not present

## 2015-07-30 ENCOUNTER — Other Ambulatory Visit: Payer: Self-pay | Admitting: Cardiology

## 2015-08-20 DIAGNOSIS — E7800 Pure hypercholesterolemia, unspecified: Secondary | ICD-10-CM | POA: Diagnosis not present

## 2015-08-20 DIAGNOSIS — E1122 Type 2 diabetes mellitus with diabetic chronic kidney disease: Secondary | ICD-10-CM | POA: Diagnosis not present

## 2015-08-20 DIAGNOSIS — Z6828 Body mass index (BMI) 28.0-28.9, adult: Secondary | ICD-10-CM | POA: Diagnosis not present

## 2015-08-20 DIAGNOSIS — N182 Chronic kidney disease, stage 2 (mild): Secondary | ICD-10-CM | POA: Diagnosis not present

## 2015-08-20 DIAGNOSIS — Z789 Other specified health status: Secondary | ICD-10-CM | POA: Diagnosis not present

## 2015-08-20 DIAGNOSIS — I10 Essential (primary) hypertension: Secondary | ICD-10-CM | POA: Diagnosis not present

## 2015-08-25 DIAGNOSIS — R918 Other nonspecific abnormal finding of lung field: Secondary | ICD-10-CM | POA: Diagnosis not present

## 2015-08-25 DIAGNOSIS — L409 Psoriasis, unspecified: Secondary | ICD-10-CM | POA: Diagnosis not present

## 2015-08-25 DIAGNOSIS — R61 Generalized hyperhidrosis: Secondary | ICD-10-CM | POA: Diagnosis not present

## 2015-08-25 DIAGNOSIS — L4050 Arthropathic psoriasis, unspecified: Secondary | ICD-10-CM | POA: Diagnosis not present

## 2015-08-25 DIAGNOSIS — Z79899 Other long term (current) drug therapy: Secondary | ICD-10-CM | POA: Diagnosis not present

## 2015-08-25 DIAGNOSIS — Z5181 Encounter for therapeutic drug level monitoring: Secondary | ICD-10-CM | POA: Diagnosis not present

## 2015-09-08 DIAGNOSIS — E119 Type 2 diabetes mellitus without complications: Secondary | ICD-10-CM | POA: Diagnosis not present

## 2015-09-08 DIAGNOSIS — I10 Essential (primary) hypertension: Secondary | ICD-10-CM | POA: Diagnosis not present

## 2015-09-29 DIAGNOSIS — R61 Generalized hyperhidrosis: Secondary | ICD-10-CM | POA: Diagnosis not present

## 2015-09-29 DIAGNOSIS — L409 Psoriasis, unspecified: Secondary | ICD-10-CM | POA: Diagnosis not present

## 2015-10-02 DIAGNOSIS — F0390 Unspecified dementia without behavioral disturbance: Secondary | ICD-10-CM | POA: Diagnosis not present

## 2015-10-02 DIAGNOSIS — J029 Acute pharyngitis, unspecified: Secondary | ICD-10-CM | POA: Diagnosis not present

## 2015-10-02 DIAGNOSIS — E7800 Pure hypercholesterolemia, unspecified: Secondary | ICD-10-CM | POA: Diagnosis not present

## 2015-10-22 DIAGNOSIS — E119 Type 2 diabetes mellitus without complications: Secondary | ICD-10-CM | POA: Diagnosis not present

## 2015-10-22 DIAGNOSIS — I10 Essential (primary) hypertension: Secondary | ICD-10-CM | POA: Diagnosis not present

## 2015-11-20 DIAGNOSIS — Z0000 Encounter for general adult medical examination without abnormal findings: Secondary | ICD-10-CM | POA: Diagnosis not present

## 2015-11-20 DIAGNOSIS — F329 Major depressive disorder, single episode, unspecified: Secondary | ICD-10-CM | POA: Diagnosis not present

## 2015-11-20 DIAGNOSIS — E1122 Type 2 diabetes mellitus with diabetic chronic kidney disease: Secondary | ICD-10-CM | POA: Diagnosis not present

## 2015-11-20 DIAGNOSIS — Z1389 Encounter for screening for other disorder: Secondary | ICD-10-CM | POA: Diagnosis not present

## 2015-11-20 DIAGNOSIS — Z6828 Body mass index (BMI) 28.0-28.9, adult: Secondary | ICD-10-CM | POA: Diagnosis not present

## 2015-11-20 DIAGNOSIS — N182 Chronic kidney disease, stage 2 (mild): Secondary | ICD-10-CM | POA: Diagnosis not present

## 2015-11-20 DIAGNOSIS — Z299 Encounter for prophylactic measures, unspecified: Secondary | ICD-10-CM | POA: Diagnosis not present

## 2015-11-20 DIAGNOSIS — Z7189 Other specified counseling: Secondary | ICD-10-CM | POA: Diagnosis not present

## 2015-11-21 DIAGNOSIS — E559 Vitamin D deficiency, unspecified: Secondary | ICD-10-CM | POA: Diagnosis not present

## 2015-11-21 DIAGNOSIS — E7800 Pure hypercholesterolemia, unspecified: Secondary | ICD-10-CM | POA: Diagnosis not present

## 2015-11-21 DIAGNOSIS — E1122 Type 2 diabetes mellitus with diabetic chronic kidney disease: Secondary | ICD-10-CM | POA: Diagnosis not present

## 2015-11-21 DIAGNOSIS — R5383 Other fatigue: Secondary | ICD-10-CM | POA: Diagnosis not present

## 2015-12-08 DIAGNOSIS — I6521 Occlusion and stenosis of right carotid artery: Secondary | ICD-10-CM | POA: Diagnosis not present

## 2015-12-12 DIAGNOSIS — I2510 Atherosclerotic heart disease of native coronary artery without angina pectoris: Secondary | ICD-10-CM | POA: Diagnosis not present

## 2015-12-12 DIAGNOSIS — N182 Chronic kidney disease, stage 2 (mild): Secondary | ICD-10-CM | POA: Diagnosis not present

## 2015-12-12 DIAGNOSIS — E538 Deficiency of other specified B group vitamins: Secondary | ICD-10-CM | POA: Diagnosis not present

## 2015-12-12 DIAGNOSIS — G459 Transient cerebral ischemic attack, unspecified: Secondary | ICD-10-CM | POA: Diagnosis not present

## 2015-12-12 DIAGNOSIS — E1122 Type 2 diabetes mellitus with diabetic chronic kidney disease: Secondary | ICD-10-CM | POA: Diagnosis not present

## 2015-12-12 DIAGNOSIS — Z1389 Encounter for screening for other disorder: Secondary | ICD-10-CM | POA: Diagnosis not present

## 2015-12-12 DIAGNOSIS — F329 Major depressive disorder, single episode, unspecified: Secondary | ICD-10-CM | POA: Diagnosis not present

## 2015-12-17 DIAGNOSIS — I6523 Occlusion and stenosis of bilateral carotid arteries: Secondary | ICD-10-CM | POA: Diagnosis not present

## 2015-12-17 DIAGNOSIS — R42 Dizziness and giddiness: Secondary | ICD-10-CM | POA: Diagnosis not present

## 2015-12-17 DIAGNOSIS — R5383 Other fatigue: Secondary | ICD-10-CM | POA: Diagnosis not present

## 2015-12-17 DIAGNOSIS — I6502 Occlusion and stenosis of left vertebral artery: Secondary | ICD-10-CM | POA: Diagnosis not present

## 2015-12-17 DIAGNOSIS — I2510 Atherosclerotic heart disease of native coronary artery without angina pectoris: Secondary | ICD-10-CM | POA: Diagnosis not present

## 2015-12-22 DIAGNOSIS — G4710 Hypersomnia, unspecified: Secondary | ICD-10-CM | POA: Diagnosis not present

## 2015-12-22 DIAGNOSIS — F0390 Unspecified dementia without behavioral disturbance: Secondary | ICD-10-CM | POA: Diagnosis not present

## 2015-12-22 DIAGNOSIS — N182 Chronic kidney disease, stage 2 (mild): Secondary | ICD-10-CM | POA: Diagnosis not present

## 2015-12-22 DIAGNOSIS — E1122 Type 2 diabetes mellitus with diabetic chronic kidney disease: Secondary | ICD-10-CM | POA: Diagnosis not present

## 2015-12-30 DIAGNOSIS — G4730 Sleep apnea, unspecified: Secondary | ICD-10-CM | POA: Diagnosis not present

## 2015-12-30 DIAGNOSIS — G4710 Hypersomnia, unspecified: Secondary | ICD-10-CM | POA: Diagnosis not present

## 2016-01-01 DIAGNOSIS — G4730 Sleep apnea, unspecified: Secondary | ICD-10-CM | POA: Diagnosis not present

## 2016-01-11 NOTE — Progress Notes (Signed)
Cardiology Office Note  Date: 01/12/2016   ID: Jennifer [name], DOB 1940-12-31, MRN [id number]  PCP: Kirstie Peri, MD  Primary Cardiologist: Nona Dell, MD   Chief Complaint  Patient presents with  . History of cardiomyopathy  . Coronary atherosclerosis    History of Present Illness: Jennifer [name] is a 75 y.o. female last seen in January. She does not report any angina symptoms and has stable NYHA class II dyspnea with typical activities. She is fairly sedentary at home.  She had a recent physical with lab work per Dr. Sherryll Burger. Also reportedly had carotid Dopplers and a CTA of the carotids at [facility], results being requested. Patient's brother states that they were told results were reassuring.  Today's blood pressure is recently well controlled. Patient continues on aspirin, low-dose Lipitor, Hyzaar, and Toprol-XL.  Past Medical History:  Diagnosis Date  . Anxiety   . Anxiety   . Coronary atherosclerosis of native coronary artery    Nonobstructive at catheterization June 2014  . DDD (degenerative disc disease), lumbar   . Depression   . Diabetes mellitus (HCC)    Borderline  . Essential hypertension, benign   . Hyperlipidemia   . Nephrolithiasis   . Psoriatic arthritis (HCC)   . Takotsubo cardiomyopathy    LVEF 60-65% September 2014    Past Surgical History:  Procedure Laterality Date  . APPENDECTOMY    . CHOLECYSTECTOMY    . LEFT HEART CATHETERIZATION WITH CORONARY ANGIOGRAM N/A 12/18/2012   Procedure: LEFT HEART CATHETERIZATION WITH CORONARY ANGIOGRAM;  Surgeon: Tonny Bollman, MD;  Location: [facility] CATH LAB;  Service: Cardiovascular;  Laterality: N/A;    Current Outpatient Prescriptions  Medication Sig Dispense Refill  . aspirin 81 MG EC tablet Take 1 tablet (81 mg total) by mouth daily. Swallow whole. 30 tablet 12  . atorvastatin (LIPITOR) 10 MG tablet TAKE 1 TABLET AT 6:00 P.M. 30 tablet 3  . Biotin 5000 MCG CAPS Take 10,000 mcg by mouth daily.     .  clobetasol cream (TEMOVATE) 0.05 % Apply 1 application topically 2 (two) times daily.    Marland Kitchen desonide (DESOWEN) 0.05 % ointment Apply 1 application topically as needed.     . donepezil (ARICEPT) 5 MG tablet Take 1 tablet (5 mg total) by mouth at bedtime. 30 tablet 0  . fish oil-omega-3 fatty acids 1000 MG capsule Take 2 g by mouth daily.     Marland Kitchen gabapentin (NEURONTIN) 100 MG capsule Take 100 mg by mouth daily.     . Hypromellose (ARTIFICIAL TEARS OP) Place 2 drops into both eyes daily as needed (dry eyes).    . Ixekizumab (TALTZ [place]) Inject 80 mg into the skin every 30 (thirty) days.    Marland Kitchen losartan-hydrochlorothiazide (HYZAAR) 50-12.5 MG per tablet Take 1 tablet by mouth daily.    . memantine (NAMENDA XR) 28 MG CP24 24 hr capsule Take 1 capsule (28 mg total) by mouth daily. 30 capsule 3  . metoprolol succinate (TOPROL-XL) 25 MG 24 hr tablet TAKE (1/2) TABLET BY MOUTH DAILY. 15 tablet 6  . Multiple Vitamins-Minerals (CENTRUM SILVER PO) Take 1 tablet by mouth daily.    Marland Kitchen PARoxetine (PAXIL) 40 MG tablet Take 40 mg by mouth daily.    . tacrolimus (PROTOPIC) 0.1 % ointment as needed.     . triamcinolone lotion (KENALOG) 0.1 % Apply 1 application topically 2 (two) times daily as needed. With Silver Sulfadiazine 1%    . triamcinolone ointment (KENALOG) 0.1 % as needed.  No current facility-administered medications for this visit.    Allergies:  Review of patient's allergies indicates no known allergies.   Social History: The patient  reports that she has never smoked. She has never used smokeless tobacco. She reports that she does not drink alcohol or use drugs.   ROS:  Please see the history of present illness. Otherwise, complete review of systems is positive for memory deficits with dementia.  All other systems are reviewed and negative.   Physical Exam: VS:  BP 138/70   Pulse (!) 58   Ht 5' (1.524 m)   Wt 143 lb 9.6 oz (65.1 kg)   SpO2 95%   BMI 28.04 kg/m , BMI Body mass index is 28.04  kg/m.  Wt Readings from Last 3 Encounters:  01/12/16 143 lb 9.6 oz (65.1 kg)  07/01/15 143 lb (64.9 kg)  01/21/15 131 lb 6.4 oz (59.6 kg)    Appears comfortable.  HEENT: Conjunctiva and lids normal, oropharynx clear.  Neck: Supple, no elevated JVP or carotid bruits, no thyromegaly.  Lungs: Clear to auscultation, nonlabored breathing at rest.  Cardiac: Regular rate and rhythm, no S3 or significant systolic murmur, no pericardial rub.  Abdomen: NABS.  Extremities: No pitting edema, distal pulses 2+. Skin: Warm and dry. Musculoskeletal: No kyphosis. Neuropsychiatric: Alert and oriented 3, affect appropriate.  ECG: I personally reviewed the tracing from 01/21/2015 which showed sinus bradycardia with increased voltage, left anterior fascicular block, poor R-wave progression, and nonspecific T-wave changes.  Other Studies Reviewed Today:  Echocardiogram 02/28/2013: Study Conclusions  - Left ventricle: The cavity size was normal. Wall thickness was normal. Systolic function was normal. The estimated ejection fraction was in the range of 60% to 65%. Wall motion was normal; there were no regional wall motion abnormalities. Doppler parameters are consistent with abnormal left ventricular relaxation (grade 1 diastolic dysfunction). - Right atrium: Central venous pressure: 3mm Hg (est). - Tricuspid valve: Physiologic regurgitation. - Pulmonary arteries: PA peak pressure: 25mm Hg (S). - Pericardium, extracardiac: There was no pericardial effusion. Impressions:  - Comparison to previous study June 2014. There has been normalization of LV systolic function, LVEF now 60-65%. Grade 1 diastolic dysfunction. Normal PASP, no pericardial effusion.  Assessment and Plan:  1. History of Takotsubo cardiomyopathy with subsequent normalization of LVEF on medical therapy. She has NYHA class II dyspnea which is stable. Continue observation.  2. Essential hypertension,  blood pressure control is adequate today. No changes were made.  3. Coronary atherosclerosis, mild by cardiac catheterization. She does not report any angina. Continue medical therapy with aspirin and statin.  4. Possible findings of carotid artery atherosclerosis. Requesting results of carotid Dopplers and CTA from Dr. Sherryll Burger.  Current medicines were reviewed with the patient today.  Disposition: Follow-up with me in 6 months.   Signed, Jonelle Sidle, MD, [facility] 01/12/2016 12:21 PM    [facility] at [facility] [address] [place], [place], [place] [address] Phone: [phone]; Fax: [phone]

## 2016-01-12 ENCOUNTER — Ambulatory Visit (INDEPENDENT_AMBULATORY_CARE_PROVIDER_SITE_OTHER): Payer: Medicare Other | Admitting: Cardiology

## 2016-01-12 ENCOUNTER — Encounter: Payer: Self-pay | Admitting: *Deleted

## 2016-01-12 ENCOUNTER — Encounter: Payer: Self-pay | Admitting: Cardiology

## 2016-01-12 VITALS — BP 138/70 | HR 58 | Ht 60.0 in | Wt 143.6 lb

## 2016-01-12 DIAGNOSIS — E785 Hyperlipidemia, unspecified: Secondary | ICD-10-CM

## 2016-01-12 DIAGNOSIS — I10 Essential (primary) hypertension: Secondary | ICD-10-CM | POA: Diagnosis not present

## 2016-01-12 DIAGNOSIS — I5181 Takotsubo syndrome: Secondary | ICD-10-CM | POA: Diagnosis not present

## 2016-01-12 DIAGNOSIS — I2510 Atherosclerotic heart disease of native coronary artery without angina pectoris: Secondary | ICD-10-CM | POA: Diagnosis not present

## 2016-01-12 NOTE — Patient Instructions (Signed)

## 2016-02-05 DIAGNOSIS — E119 Type 2 diabetes mellitus without complications: Secondary | ICD-10-CM | POA: Diagnosis not present

## 2016-02-05 DIAGNOSIS — E7800 Pure hypercholesterolemia, unspecified: Secondary | ICD-10-CM | POA: Diagnosis not present

## 2016-02-05 DIAGNOSIS — I10 Essential (primary) hypertension: Secondary | ICD-10-CM | POA: Diagnosis not present

## 2016-02-26 DIAGNOSIS — F0390 Unspecified dementia without behavioral disturbance: Secondary | ICD-10-CM | POA: Diagnosis not present

## 2016-02-26 DIAGNOSIS — E1122 Type 2 diabetes mellitus with diabetic chronic kidney disease: Secondary | ICD-10-CM | POA: Diagnosis not present

## 2016-02-26 DIAGNOSIS — N182 Chronic kidney disease, stage 2 (mild): Secondary | ICD-10-CM | POA: Diagnosis not present

## 2016-02-26 DIAGNOSIS — F329 Major depressive disorder, single episode, unspecified: Secondary | ICD-10-CM | POA: Diagnosis not present

## 2016-03-04 DIAGNOSIS — Z1231 Encounter for screening mammogram for malignant neoplasm of breast: Secondary | ICD-10-CM | POA: Diagnosis not present

## 2016-04-09 DIAGNOSIS — Z299 Encounter for prophylactic measures, unspecified: Secondary | ICD-10-CM | POA: Diagnosis not present

## 2016-04-09 DIAGNOSIS — E1122 Type 2 diabetes mellitus with diabetic chronic kidney disease: Secondary | ICD-10-CM | POA: Diagnosis not present

## 2016-04-09 DIAGNOSIS — E7800 Pure hypercholesterolemia, unspecified: Secondary | ICD-10-CM | POA: Diagnosis not present

## 2016-04-09 DIAGNOSIS — N182 Chronic kidney disease, stage 2 (mild): Secondary | ICD-10-CM | POA: Diagnosis not present

## 2016-04-09 DIAGNOSIS — Z6828 Body mass index (BMI) 28.0-28.9, adult: Secondary | ICD-10-CM | POA: Diagnosis not present

## 2016-04-09 DIAGNOSIS — F329 Major depressive disorder, single episode, unspecified: Secondary | ICD-10-CM | POA: Diagnosis not present

## 2016-04-14 DIAGNOSIS — Z23 Encounter for immunization: Secondary | ICD-10-CM | POA: Diagnosis not present

## 2016-04-26 DIAGNOSIS — L409 Psoriasis, unspecified: Secondary | ICD-10-CM | POA: Diagnosis not present

## 2016-04-26 DIAGNOSIS — Z79899 Other long term (current) drug therapy: Secondary | ICD-10-CM | POA: Diagnosis not present

## 2016-04-26 DIAGNOSIS — L4050 Arthropathic psoriasis, unspecified: Secondary | ICD-10-CM | POA: Diagnosis not present

## 2016-04-26 DIAGNOSIS — Z5181 Encounter for therapeutic drug level monitoring: Secondary | ICD-10-CM | POA: Diagnosis not present

## 2016-04-27 DIAGNOSIS — I10 Essential (primary) hypertension: Secondary | ICD-10-CM | POA: Diagnosis not present

## 2016-04-27 DIAGNOSIS — E7800 Pure hypercholesterolemia, unspecified: Secondary | ICD-10-CM | POA: Diagnosis not present

## 2016-04-27 DIAGNOSIS — E119 Type 2 diabetes mellitus without complications: Secondary | ICD-10-CM | POA: Diagnosis not present

## 2016-07-14 DIAGNOSIS — E119 Type 2 diabetes mellitus without complications: Secondary | ICD-10-CM | POA: Diagnosis not present

## 2016-07-14 DIAGNOSIS — E7800 Pure hypercholesterolemia, unspecified: Secondary | ICD-10-CM | POA: Diagnosis not present

## 2016-07-14 DIAGNOSIS — I10 Essential (primary) hypertension: Secondary | ICD-10-CM | POA: Diagnosis not present

## 2016-07-16 DIAGNOSIS — N182 Chronic kidney disease, stage 2 (mild): Secondary | ICD-10-CM | POA: Diagnosis not present

## 2016-07-16 DIAGNOSIS — Z299 Encounter for prophylactic measures, unspecified: Secondary | ICD-10-CM | POA: Diagnosis not present

## 2016-07-16 DIAGNOSIS — I2510 Atherosclerotic heart disease of native coronary artery without angina pectoris: Secondary | ICD-10-CM | POA: Diagnosis not present

## 2016-07-16 DIAGNOSIS — Z6827 Body mass index (BMI) 27.0-27.9, adult: Secondary | ICD-10-CM | POA: Diagnosis not present

## 2016-07-16 DIAGNOSIS — Z789 Other specified health status: Secondary | ICD-10-CM | POA: Diagnosis not present

## 2016-07-16 DIAGNOSIS — E1129 Type 2 diabetes mellitus with other diabetic kidney complication: Secondary | ICD-10-CM | POA: Diagnosis not present

## 2016-07-16 DIAGNOSIS — E1122 Type 2 diabetes mellitus with diabetic chronic kidney disease: Secondary | ICD-10-CM | POA: Diagnosis not present

## 2016-07-16 DIAGNOSIS — Z713 Dietary counseling and surveillance: Secondary | ICD-10-CM | POA: Diagnosis not present

## 2016-07-16 DIAGNOSIS — F0390 Unspecified dementia without behavioral disturbance: Secondary | ICD-10-CM | POA: Diagnosis not present

## 2016-07-21 NOTE — Progress Notes (Signed)
Cardiology Office Note  Date: 07/22/2016   ID: Jennifer [name], DOB 12-Sep-1940, MRN [id number]  PCP: Kirstie PeriSHAH,ASHISH, MD  Primary Cardiologist: Nona DellSamuel McDowell, MD   Chief Complaint  Patient presents with  . History of cardiomyopathy    History of Present Illness: Jennifer [name] is a 76 y.o. female last seen in July 2017. She is here today with her brother. She does not report any change in symptoms her stamina. Has not been very active with the cold weather. She does not report having any chest pain or palpitations. She continues to follow with Dr. Sherryll BurgerShah on a regular basis.  We went over her medications. Cardiac regimen includes aspirin, Lipitor, Hyzaar, and Toprol-XL.  I reviewed her ECG today which shows sinus rhythm with LVH and poor R-wave progression.  She did [facility] as outlined below, less than 50% bilateral ICA stenoses.  Past Medical History:  Diagnosis Date  . Anxiety   . Anxiety   . Coronary atherosclerosis of native coronary artery    Nonobstructive at catheterization June 2014  . DDD (degenerative disc disease), lumbar   . Depression   . Diabetes mellitus (HCC)    Borderline  . Essential hypertension, benign   . Hyperlipidemia   . Nephrolithiasis   . Psoriatic arthritis (HCC)   . Takotsubo cardiomyopathy    LVEF 60-65% September 2014    Past Surgical History:  Procedure Laterality Date  . APPENDECTOMY    . CHOLECYSTECTOMY    . LEFT HEART CATHETERIZATION WITH CORONARY ANGIOGRAM N/A 12/18/2012   Procedure: LEFT HEART CATHETERIZATION WITH CORONARY ANGIOGRAM;  Surgeon: Tonny BollmanMichael Cooper, MD;  [facility];  Service: Cardiovascular;  Laterality: N/A;    Current Outpatient Prescriptions  Medication Sig Dispense Refill  . aspirin 81 MG EC tablet Take 1 tablet (81 mg total) by mouth daily. Swallow whole. 30 tablet 12  . atorvastatin (LIPITOR) 10 MG tablet TAKE 1 TABLET AT 6:00 P.M. 30 tablet 3   . Biotin 5000 MCG CAPS Take 10,000 mcg by mouth daily.     . clobetasol cream (TEMOVATE) 0.05 % Apply 1 application topically 2 (two) times daily.    Marland Kitchen. desonide (DESOWEN) 0.05 % ointment Apply 1 application topically as needed.     . donepezil (ARICEPT) 5 MG tablet Take 1 tablet (5 mg total) by mouth at bedtime. 30 tablet 0  . fish oil-omega-3 fatty acids 1000 MG capsule Take 2 g by mouth daily.     Marland Kitchen. gabapentin (NEURONTIN) 100 MG capsule Take 100 mg by mouth daily.     . Hypromellose (ARTIFICIAL TEARS OP) Place 2 drops into both eyes daily as needed (dry eyes).    . Ixekizumab (TALTZ [place]) Inject 80 mg into the skin every 30 (thirty) days.    Marland Kitchen. losartan-hydrochlorothiazide (HYZAAR) 50-12.5 MG per tablet Take 1 tablet by mouth daily.    . memantine (NAMENDA XR) 28 MG CP24 24 hr capsule Take 1 capsule (28 mg total) by mouth daily. 30 capsule 3  . metoprolol succinate (TOPROL-XL) 25 MG 24 hr tablet TAKE (1/2) TABLET BY MOUTH DAILY. 15 tablet 6  . Multiple Vitamins-Minerals (CENTRUM SILVER PO) Take 1 tablet by mouth daily.    Marland Kitchen. PARoxetine (PAXIL) 40 MG tablet Take 40 mg by mouth daily.    . tacrolimus (PROTOPIC) 0.1 % ointment as needed.     . triamcinolone lotion (KENALOG) 0.1 % Apply 1 application topically 2 (two) times daily  as needed. With Silver Sulfadiazine 1%    . triamcinolone ointment (KENALOG) 0.1 % as needed.      No current facility-administered medications for this visit.    Allergies:  Patient has no known allergies.   Social History: The patient  reports that she has never smoked. She has never used smokeless tobacco. She reports that she does not drink alcohol or use drugs.   ROS:  Please see the history of present illness. Otherwise, complete review of systems is positive for memory loss.  All other systems are reviewed and negative.   Physical Exam: VS:  BP 128/72   Pulse 63   Ht 5' (1.524 m)   Wt 140 lb (63.5 kg)   SpO2 92%   BMI 27.34 kg/m , BMI Body mass index is  27.34 kg/m.  Wt Readings from Last 3 Encounters:  07/22/16 140 lb (63.5 kg)  01/12/16 143 lb 9.6 oz (65.1 kg)  07/01/15 143 lb (64.9 kg)    Appears comfortable.  HEENT: Conjunctiva and lids normal, oropharynx clear.  Neck: Supple, no elevated JVP or carotid bruits, no thyromegaly.  Lungs: Clear to auscultation, nonlabored breathing at rest.  Cardiac: Regular rate and rhythm, no S3 or significant systolic murmur, no pericardial rub.  Abdomen: NABS.  Extremities: No pitting edema, distal pulses 2+. Skin: Warm and dry. Musculoskeletal: No kyphosis. Neuropsychiatric: Alert and oriented 3, affect appropriate.  ECG: I personally reviewed the tracing from 01/21/2015 which showed sinus bradycardia with increased voltage, left anterior fascicular block, poor R-wave progression, and nonspecific T-wave changes.  Recent Labwork:  June 2017: Hemoglobin A1c 6.5, cholesterol 150, triglycerides 88, HDL 77 LDL 55, BUN 16, creatinine 0.7, potassium 3.8, AST 25, ALT 28, hemoglobin 14.5, platelets 195, TSH 3.85  Other Studies Reviewed Today:  Carotid Dopplers 12/08/2015 [facility]): Less than 50% bilateral ICA stenoses.  Echocardiogram 02/28/2013: Study Conclusions  - Left ventricle: The cavity size was normal. Wall thickness was normal. Systolic function was normal. The estimated ejection fraction was in the range of 60% to 65%. Wall motion was normal; there were no regional wall motion abnormalities. Doppler parameters are consistent with abnormal left ventricular relaxation (grade 1 diastolic dysfunction). - Right atrium: Central venous pressure: 3mm Hg (est). - Tricuspid valve: Physiologic regurgitation. - Pulmonary arteries: PA peak pressure: 25mm Hg (S). - Pericardium, extracardiac: There was no pericardial effusion. Impressions:  - Comparison to previous study June 2014. There has been normalization of LV systolic function, LVEF now 60-65%. Grade  1 diastolic dysfunction. Normal PASP, no pericardial effusion.  Assessment and Plan:  1. History of Takotsubo cardiomyopathy with normalization of LVEF on medical therapy. Her last echocardiogram was in 2014, no clear indication to repeat one now with stable symptomatology. We will continue with regular follow-up.  2. Essential hypertension, blood pressure is adequately controlled today. Keep follow-up with Dr. Sherryll Burger.  3. Nonobstructive carotid artery disease by carotid Dopplers in September of last year. She is on aspirin and statin therapy.  4. Hyperlipidemia, on Lipitor. Last LDL was 65.  Current medicines were reviewed with the patient today.   Orders Placed This Encounter  Procedures  . EKG 12-Lead    Disposition: Follow-up in 6 months.  Signed, Jonelle Sidle, MD, [facility] 07/22/2016 10:42 AM    [facility] at [facility] [address] [place], [place], [place] [address] Phone: [phone]; Fax: [phone]

## 2016-07-22 ENCOUNTER — Encounter: Payer: Self-pay | Admitting: Cardiology

## 2016-07-22 ENCOUNTER — Ambulatory Visit (INDEPENDENT_AMBULATORY_CARE_PROVIDER_SITE_OTHER): Payer: Medicare Other | Admitting: Cardiology

## 2016-07-22 VITALS — BP 128/72 | HR 63 | Ht 60.0 in | Wt 140.0 lb

## 2016-07-22 DIAGNOSIS — E782 Mixed hyperlipidemia: Secondary | ICD-10-CM | POA: Diagnosis not present

## 2016-07-22 DIAGNOSIS — I739 Peripheral vascular disease, unspecified: Secondary | ICD-10-CM

## 2016-07-22 DIAGNOSIS — I5181 Takotsubo syndrome: Secondary | ICD-10-CM

## 2016-07-22 DIAGNOSIS — I10 Essential (primary) hypertension: Secondary | ICD-10-CM | POA: Diagnosis not present

## 2016-07-22 DIAGNOSIS — I779 Disorder of arteries and arterioles, unspecified: Secondary | ICD-10-CM | POA: Diagnosis not present

## 2016-07-22 NOTE — Patient Instructions (Signed)
Your physician wants you to follow-up in: 6 months with Dr. Mcdowell. You will receive a reminder letter in the mail two months in advance. If you don't receive a letter, please call our office to schedule the follow-up appointment.   Your physician recommends that you continue on your current medications as directed. Please refer to the Current Medication list given to you today.    Thank you for choosing [facility]!!       

## 2016-08-09 DIAGNOSIS — E7800 Pure hypercholesterolemia, unspecified: Secondary | ICD-10-CM | POA: Diagnosis not present

## 2016-08-09 DIAGNOSIS — I10 Essential (primary) hypertension: Secondary | ICD-10-CM | POA: Diagnosis not present

## 2016-08-09 DIAGNOSIS — E119 Type 2 diabetes mellitus without complications: Secondary | ICD-10-CM | POA: Diagnosis not present

## 2016-08-24 DIAGNOSIS — G40409 Other generalized epilepsy and epileptic syndromes, not intractable, without status epilepticus: Secondary | ICD-10-CM | POA: Diagnosis not present

## 2016-08-24 DIAGNOSIS — Z299 Encounter for prophylactic measures, unspecified: Secondary | ICD-10-CM | POA: Diagnosis not present

## 2016-08-24 DIAGNOSIS — I739 Peripheral vascular disease, unspecified: Secondary | ICD-10-CM | POA: Diagnosis not present

## 2016-08-24 DIAGNOSIS — B372 Candidiasis of skin and nail: Secondary | ICD-10-CM | POA: Diagnosis not present

## 2016-08-24 DIAGNOSIS — E119 Type 2 diabetes mellitus without complications: Secondary | ICD-10-CM | POA: Diagnosis not present

## 2016-08-24 DIAGNOSIS — I10 Essential (primary) hypertension: Secondary | ICD-10-CM | POA: Diagnosis not present

## 2016-08-24 DIAGNOSIS — N182 Chronic kidney disease, stage 2 (mild): Secondary | ICD-10-CM | POA: Diagnosis not present

## 2016-08-24 DIAGNOSIS — E663 Overweight: Secondary | ICD-10-CM | POA: Diagnosis not present

## 2016-08-24 DIAGNOSIS — Z79899 Other long term (current) drug therapy: Secondary | ICD-10-CM | POA: Diagnosis not present

## 2016-08-24 DIAGNOSIS — Z7982 Long term (current) use of aspirin: Secondary | ICD-10-CM | POA: Diagnosis not present

## 2016-08-24 DIAGNOSIS — R55 Syncope and collapse: Secondary | ICD-10-CM | POA: Diagnosis not present

## 2016-08-24 DIAGNOSIS — E1122 Type 2 diabetes mellitus with diabetic chronic kidney disease: Secondary | ICD-10-CM | POA: Diagnosis not present

## 2016-08-24 DIAGNOSIS — L4050 Arthropathic psoriasis, unspecified: Secondary | ICD-10-CM | POA: Diagnosis not present

## 2016-08-24 DIAGNOSIS — R4182 Altered mental status, unspecified: Secondary | ICD-10-CM | POA: Diagnosis not present

## 2016-08-24 DIAGNOSIS — F0390 Unspecified dementia without behavioral disturbance: Secondary | ICD-10-CM | POA: Diagnosis not present

## 2016-08-24 DIAGNOSIS — Z713 Dietary counseling and surveillance: Secondary | ICD-10-CM | POA: Diagnosis not present

## 2016-08-24 DIAGNOSIS — M1990 Unspecified osteoarthritis, unspecified site: Secondary | ICD-10-CM | POA: Diagnosis not present

## 2016-08-27 DIAGNOSIS — Z789 Other specified health status: Secondary | ICD-10-CM | POA: Diagnosis not present

## 2016-08-27 DIAGNOSIS — Z713 Dietary counseling and surveillance: Secondary | ICD-10-CM | POA: Diagnosis not present

## 2016-08-27 DIAGNOSIS — R21 Rash and other nonspecific skin eruption: Secondary | ICD-10-CM | POA: Diagnosis not present

## 2016-08-27 DIAGNOSIS — R569 Unspecified convulsions: Secondary | ICD-10-CM | POA: Diagnosis not present

## 2016-08-27 DIAGNOSIS — N182 Chronic kidney disease, stage 2 (mild): Secondary | ICD-10-CM | POA: Diagnosis not present

## 2016-08-27 DIAGNOSIS — I10 Essential (primary) hypertension: Secondary | ICD-10-CM | POA: Diagnosis not present

## 2016-08-27 DIAGNOSIS — F0390 Unspecified dementia without behavioral disturbance: Secondary | ICD-10-CM | POA: Diagnosis not present

## 2016-08-27 DIAGNOSIS — Z299 Encounter for prophylactic measures, unspecified: Secondary | ICD-10-CM | POA: Diagnosis not present

## 2016-08-27 DIAGNOSIS — Z6827 Body mass index (BMI) 27.0-27.9, adult: Secondary | ICD-10-CM | POA: Diagnosis not present

## 2016-08-27 DIAGNOSIS — E1122 Type 2 diabetes mellitus with diabetic chronic kidney disease: Secondary | ICD-10-CM | POA: Diagnosis not present

## 2016-10-06 DIAGNOSIS — E7800 Pure hypercholesterolemia, unspecified: Secondary | ICD-10-CM | POA: Diagnosis not present

## 2016-10-06 DIAGNOSIS — I10 Essential (primary) hypertension: Secondary | ICD-10-CM | POA: Diagnosis not present

## 2016-10-06 DIAGNOSIS — E119 Type 2 diabetes mellitus without complications: Secondary | ICD-10-CM | POA: Diagnosis not present

## 2016-10-19 DIAGNOSIS — F0390 Unspecified dementia without behavioral disturbance: Secondary | ICD-10-CM | POA: Diagnosis not present

## 2016-10-19 DIAGNOSIS — I2510 Atherosclerotic heart disease of native coronary artery without angina pectoris: Secondary | ICD-10-CM | POA: Diagnosis not present

## 2016-10-19 DIAGNOSIS — I739 Peripheral vascular disease, unspecified: Secondary | ICD-10-CM | POA: Diagnosis not present

## 2016-10-19 DIAGNOSIS — Z299 Encounter for prophylactic measures, unspecified: Secondary | ICD-10-CM | POA: Diagnosis not present

## 2016-10-19 DIAGNOSIS — F419 Anxiety disorder, unspecified: Secondary | ICD-10-CM | POA: Diagnosis not present

## 2016-10-19 DIAGNOSIS — E7800 Pure hypercholesterolemia, unspecified: Secondary | ICD-10-CM | POA: Diagnosis not present

## 2016-10-19 DIAGNOSIS — Z6827 Body mass index (BMI) 27.0-27.9, adult: Secondary | ICD-10-CM | POA: Diagnosis not present

## 2016-10-19 DIAGNOSIS — N182 Chronic kidney disease, stage 2 (mild): Secondary | ICD-10-CM | POA: Diagnosis not present

## 2016-10-19 DIAGNOSIS — E1165 Type 2 diabetes mellitus with hyperglycemia: Secondary | ICD-10-CM | POA: Diagnosis not present

## 2016-10-19 DIAGNOSIS — F329 Major depressive disorder, single episode, unspecified: Secondary | ICD-10-CM | POA: Diagnosis not present

## 2016-10-19 DIAGNOSIS — I10 Essential (primary) hypertension: Secondary | ICD-10-CM | POA: Diagnosis not present

## 2016-10-19 DIAGNOSIS — L4050 Arthropathic psoriasis, unspecified: Secondary | ICD-10-CM | POA: Diagnosis not present

## 2016-10-25 DIAGNOSIS — E119 Type 2 diabetes mellitus without complications: Secondary | ICD-10-CM | POA: Diagnosis not present

## 2016-10-25 DIAGNOSIS — E11319 Type 2 diabetes mellitus with unspecified diabetic retinopathy without macular edema: Secondary | ICD-10-CM | POA: Diagnosis not present

## 2016-10-25 DIAGNOSIS — I10 Essential (primary) hypertension: Secondary | ICD-10-CM | POA: Diagnosis not present

## 2016-10-25 DIAGNOSIS — E7800 Pure hypercholesterolemia, unspecified: Secondary | ICD-10-CM | POA: Diagnosis not present

## 2016-11-10 ENCOUNTER — Encounter: Payer: Self-pay | Admitting: Neurology

## 2016-11-10 ENCOUNTER — Ambulatory Visit (INDEPENDENT_AMBULATORY_CARE_PROVIDER_SITE_OTHER): Payer: Medicare Other | Admitting: Neurology

## 2016-11-10 VITALS — BP 130/80 | HR 60 | Ht 60.0 in | Wt 138.1 lb

## 2016-11-10 DIAGNOSIS — G40909 Epilepsy, unspecified, not intractable, without status epilepticus: Secondary | ICD-10-CM

## 2016-11-10 DIAGNOSIS — F0280 Dementia in other diseases classified elsewhere without behavioral disturbance: Secondary | ICD-10-CM

## 2016-11-10 DIAGNOSIS — G301 Alzheimer's disease with late onset: Secondary | ICD-10-CM | POA: Diagnosis not present

## 2016-11-10 MED ORDER — LEVETIRACETAM 500 MG PO TABS
500.0000 mg | ORAL_TABLET | Freq: Two times a day (BID) | ORAL | 5 refills | Status: DC
Start: 2016-11-10 — End: 2017-04-12

## 2016-11-10 NOTE — Progress Notes (Signed)
NEUROLOGY FOLLOW UP OFFICE NOTE  Jennifer [name] [id number]  HISTORY OF PRESENT ILLNESS: Jennifer [name] is a 76 year old right-handed woman with hypertension, Takotsubo cardiomyopathy, and hyperlipidemia who follows up for Alzheimer's dementia.  She is accompanied by her brother who supplements history.    UPDATE: I have not seen [name] since July 2016.  Labs from 01/06/15 included TSH 3.531 and B12 764.  At that time, she was started on Aricept and Namenda.  Her brother now pays her bills.  She no longer drives.  She is sleepy during the day.  Sleep at night is poor.  To work up episode of unresponsiveness, she had an EEG performed on 01/15/15, which was normal.  She had no recurrent spell until this past March.  She presented to the [facility] ED on 08/24/16 for further evaluation.  MRI of brain was personally reviewed and revealed mild chronic small vessel disease but no acute stroke or mass lesion.  She has had one spell in April and this month.  HISTORY: She began having noticeable memory problems in late 2014 after some medical issues, such as kidney stones, MI and back surgery.  She began having problems remembering how to get to familiar places, such as the dentist or friend's house.  She also reports difficulty remembering names of people she has known for years.  She denies difficulty recognizing faces.  She denies hallucinations, poor sleep or depression.  She has not exhibited episodes of agitation or acute confusion.  Her brother recently started living with her.  Her son lives next door.  She still pays her bills but her brother will double check her calculations which are sometimes incorrect.  She very rarely drives now, but only to her friend's house or the store, when she needs to.  She performs all of her ADLs and keeps the house tidy.  She exhibits no change in behavior or personality.   In May 2016, she had an episode of unresponsiveness lasting several minutes.  She was  with her son when suddenly her head dropped with eyes still open but zoned out.  She was unresponsive and did not talk.  She did not exhibit any twitching or incontinence.  She had an MRI of the brain performed on 11/13/14 which showed age appropriate atrophy and mild chronic small vessel disease.  Carotid doppler performed on 11/25/14 showed less than 50% bilateral ICA stenosis.  She has had two other similar spells only lasting about 30 seconds.   Both parents died in their 57s and had no history of dementia.  PAST MEDICAL HISTORY: Past Medical History:  Diagnosis Date  . Anxiety   . Anxiety   . Coronary atherosclerosis of native coronary artery    Nonobstructive at catheterization June 2014  . DDD (degenerative disc disease), lumbar   . Depression   . Diabetes mellitus (HCC)    Borderline  . Essential hypertension, benign   . Hyperlipidemia   . Nephrolithiasis   . Psoriatic arthritis (HCC)   . Takotsubo cardiomyopathy    LVEF 60-65% September 2014    MEDICATIONS: Current Outpatient Prescriptions on File Prior to Visit  Medication Sig Dispense Refill  . aspirin 81 MG EC tablet Take 1 tablet (81 mg total) by mouth daily. Swallow whole. 30 tablet 12  . atorvastatin (LIPITOR) 10 MG tablet TAKE 1 TABLET AT 6:00 P.M. 30 tablet 3  . Biotin 5000 MCG CAPS Take 10,000 mcg by mouth daily.     Marland Kitchen  clobetasol cream (TEMOVATE) 0.05 % Apply 1 application topically 2 (two) times daily.    Marland Kitchen. desonide (DESOWEN) 0.05 % ointment Apply 1 application topically as needed.     . fish oil-omega-3 fatty acids 1000 MG capsule Take 2 g by mouth daily.     Marland Kitchen. gabapentin (NEURONTIN) 100 MG capsule Take 100 mg by mouth daily.     . Hypromellose (ARTIFICIAL TEARS OP) Place 2 drops into both eyes daily as needed (dry eyes).    . Ixekizumab (TALTZ [place]) Inject 80 mg into the skin every 30 (thirty) days.    Marland Kitchen. losartan-hydrochlorothiazide (HYZAAR) 50-12.5 MG per tablet Take 1 tablet by mouth daily.    . memantine  (NAMENDA XR) 28 MG CP24 24 hr capsule Take 1 capsule (28 mg total) by mouth daily. 30 capsule 3  . metoprolol succinate (TOPROL-XL) 25 MG 24 hr tablet TAKE (1/2) TABLET BY MOUTH DAILY. 15 tablet 6  . Multiple Vitamins-Minerals (CENTRUM SILVER PO) Take 1 tablet by mouth daily.    Marland Kitchen. PARoxetine (PAXIL) 40 MG tablet Take 40 mg by mouth daily.    . tacrolimus (PROTOPIC) 0.1 % ointment as needed.     . triamcinolone lotion (KENALOG) 0.1 % Apply 1 application topically 2 (two) times daily as needed. With Silver Sulfadiazine 1%     No current facility-administered medications on file prior to visit.     ALLERGIES: Allergies  Allergen Reactions  . Cyclobenzaprine     TOO STRONG  CAN'T USE AND WORK    FAMILY HISTORY: Family History  Problem Relation Age of Onset  . CAD Father   . CAD Mother   . Hypertension Mother   . Breast cancer Mother   . Hypertension Brother         X 2  . Cancer Maternal Grandmother        breast cancer     SOCIAL HISTORY: Social History   Social History  . Marital status: Married    Spouse name: N/A  . Number of children: N/A  . Years of education: N/A   Occupational History  . Not on file.   Social History Main Topics  . Smoking status: Never Smoker  . Smokeless tobacco: Never Used  . Alcohol use No  . Drug use: No  . Sexual activity: Not Currently   Other Topics Concern  . Not on file   Social History Narrative  . No narrative on file    REVIEW OF SYSTEMS: Constitutional: No fevers, chills, or sweats, no generalized fatigue, change in appetite Eyes: No visual changes, double vision, eye pain Ear, nose and throat: No hearing loss, ear pain, nasal congestion, sore throat Cardiovascular: No chest pain, palpitations Respiratory:  No shortness of breath at rest or with exertion, wheezes GastrointestinaI: No nausea, vomiting, diarrhea, abdominal pain, fecal incontinence Genitourinary:  No dysuria, urinary retention or  frequency Musculoskeletal:  No neck pain, back pain Integumentary: No rash, pruritus, skin lesions Neurological: as above Psychiatric: No depression, insomnia, anxiety Endocrine: No palpitations, fatigue, diaphoresis, mood swings, change in appetite, change in weight, increased thirst Hematologic/Lymphatic:  No purpura, petechiae. Allergic/Immunologic: no itchy/runny eyes, nasal congestion, recent allergic reactions, rashes  PHYSICAL EXAM: Vitals:   11/10/16 1112  BP: 130/80  Pulse: 60   General: No acute distress.  Patient appears well-groomed.  normal body habitus. Head:  Normocephalic/atraumatic Eyes:  Fundi examined but not visualized Neck: supple, no paraspinal tenderness, full range of motion Heart:  Regular rate and rhythm Lungs:  Clear to auscultation bilaterally Back: No paraspinal tenderness Neurological Exam: alert and oriented to person only. Attention span and concentration poor, recent memory poor, remote memory intact, fund of knowledge intact.  Speech fluent and not dysarthric, language intact.   MMSE - Mini Mental State Exam 11/10/2016  Orientation to time 0  Orientation to Place 4  Registration 3  Attention/ Calculation 0  Recall 0  Language- name 2 objects 2  Language- repeat 1  Language- follow 3 step command 3  Language- read & follow direction 1  Write a sentence 1  Copy design 0  Total score 15   CN II-XII intact. Bulk and tone normal, muscle strength 5/5 throughout.  Sensation to light touch, temperature and vibration intact.  Deep tendon reflexes 2+ throughout, toes downgoing.  Finger to nose and heel to shin testing intact.  Gait normal, Romberg negative.  IMPRESSION: Alzheimer's dementia Recurrent transient spells, possibly complex partial seizures.  PLAN: 1.  Will discontinue donepezil, as it may reduce seizure threshold.  She will continue Namenda. 2.  Start Keppra 500mg  twice daily 3.  Advised not to nap during the day so she will be able to  sleep at night and not drowsy during the day. 4.  Follow up in 5 months.  Contact me with any recurrent spells.  25 minutes spent face to face with patient, over 50% spent discussing diagnosis and plan.  Shon Millet, DO  CC:  Kirstie Peri, MD

## 2016-11-10 NOTE — Patient Instructions (Signed)
1.  Stop donepezil.  Continue Namenda. 2.  We will start levetiracetam 500mg  twice daily for seizure prevention.  Contact me if you have further spells. 3.  Follow up in 5 months.

## 2016-11-25 DIAGNOSIS — E1165 Type 2 diabetes mellitus with hyperglycemia: Secondary | ICD-10-CM | POA: Diagnosis not present

## 2016-11-25 DIAGNOSIS — F329 Major depressive disorder, single episode, unspecified: Secondary | ICD-10-CM | POA: Diagnosis not present

## 2016-11-25 DIAGNOSIS — Z79899 Other long term (current) drug therapy: Secondary | ICD-10-CM | POA: Diagnosis not present

## 2016-11-25 DIAGNOSIS — E041 Nontoxic single thyroid nodule: Secondary | ICD-10-CM | POA: Diagnosis not present

## 2016-11-25 DIAGNOSIS — Z299 Encounter for prophylactic measures, unspecified: Secondary | ICD-10-CM | POA: Diagnosis not present

## 2016-11-25 DIAGNOSIS — Z0000 Encounter for general adult medical examination without abnormal findings: Secondary | ICD-10-CM | POA: Diagnosis not present

## 2016-11-25 DIAGNOSIS — I739 Peripheral vascular disease, unspecified: Secondary | ICD-10-CM | POA: Diagnosis not present

## 2016-11-25 DIAGNOSIS — E7800 Pure hypercholesterolemia, unspecified: Secondary | ICD-10-CM | POA: Diagnosis not present

## 2016-11-25 DIAGNOSIS — E559 Vitamin D deficiency, unspecified: Secondary | ICD-10-CM | POA: Diagnosis not present

## 2016-11-25 DIAGNOSIS — Z7189 Other specified counseling: Secondary | ICD-10-CM | POA: Diagnosis not present

## 2016-11-25 DIAGNOSIS — R569 Unspecified convulsions: Secondary | ICD-10-CM | POA: Diagnosis not present

## 2016-11-25 DIAGNOSIS — Z1389 Encounter for screening for other disorder: Secondary | ICD-10-CM | POA: Diagnosis not present

## 2016-11-25 DIAGNOSIS — Z6827 Body mass index (BMI) 27.0-27.9, adult: Secondary | ICD-10-CM | POA: Diagnosis not present

## 2016-11-25 DIAGNOSIS — R5383 Other fatigue: Secondary | ICD-10-CM | POA: Diagnosis not present

## 2016-12-01 DIAGNOSIS — E2839 Other primary ovarian failure: Secondary | ICD-10-CM | POA: Diagnosis not present

## 2017-01-31 NOTE — Progress Notes (Signed)
Cardiology Office Note  Date: 02/01/2017   ID: Jennifer [name], DOB 11-05-1940, MRN [id number]  PCP: Kirstie Peri, MD  Primary Cardiologist: Nona Dell, MD   Chief Complaint  Patient presents with  . History of cardiomyopathy    History of Present Illness: Jennifer [name] is a 76 y.o. female last seen in February. She is here today with her brother for a follow-up visit. From a cardiac perspective, she has had no decline in stamina, no increasing shortness of breath, orthopnea or PND. Her brother takes her out to shop at times, states that she is able to walk around and do well when they are out.  She is following with Neurology with Alzheimer's dementia. She has had overall progression, medication adjustments noted.  I reviewed her cardiac regimen, she continues on Toprol-XL, Hyzaar, aspirin, and Lipitor.  Past Medical History:  Diagnosis Date  . Anxiety   . Anxiety   . Coronary atherosclerosis of native coronary artery    Nonobstructive at catheterization June 2014  . DDD (degenerative disc disease), lumbar   . Depression   . Diabetes mellitus (HCC)    Borderline  . Essential hypertension, benign   . Hyperlipidemia   . Nephrolithiasis   . Psoriatic arthritis (HCC)   . Takotsubo cardiomyopathy    LVEF 60-65% September 2014    Past Surgical History:  Procedure Laterality Date  . APPENDECTOMY    . CHOLECYSTECTOMY    . LEFT HEART CATHETERIZATION WITH CORONARY ANGIOGRAM N/A 12/18/2012   Procedure: LEFT HEART CATHETERIZATION WITH CORONARY ANGIOGRAM;  Surgeon: Tonny Bollman, MD;  Location: [facility] CATH LAB;  Service: Cardiovascular;  Laterality: N/A;    Current Outpatient Prescriptions  Medication Sig Dispense Refill  . aspirin 81 MG EC tablet Take 1 tablet (81 mg total) by mouth daily. Swallow whole. 30 tablet 12  . atorvastatin (LIPITOR) 10 MG tablet TAKE 1 TABLET AT 6:00 P.M. 30 tablet 3  . Biotin 5000 MCG CAPS Take 10,000 mcg by mouth daily.     .  cholecalciferol (VITAMIN D) 1000 units tablet Take 1,000 Units by mouth daily.    . clobetasol cream (TEMOVATE) 0.05 % Apply 1 application topically 2 (two) times daily.    Marland Kitchen desonide (DESOWEN) 0.05 % ointment Apply 1 application topically as needed.     . fish oil-omega-3 fatty acids 1000 MG capsule Take 2 g by mouth daily.     . Hypromellose (ARTIFICIAL TEARS OP) Place 2 drops into both eyes daily as needed (dry eyes).    . Ixekizumab (TALTZ) 80 MG/ML SOAJ Inject into the skin.    Marland Kitchen levETIRAcetam (KEPPRA) 500 MG tablet Take 1 tablet (500 mg total) by mouth 2 (two) times daily. 60 tablet 5  . losartan-hydrochlorothiazide (HYZAAR) 50-12.5 MG per tablet Take 1 tablet by mouth daily.    . memantine (NAMENDA XR) 28 MG CP24 24 hr capsule Take 1 capsule (28 mg total) by mouth daily. 30 capsule 3  . metoprolol succinate (TOPROL-XL) 25 MG 24 hr tablet TAKE (1/2) TABLET BY MOUTH DAILY. 15 tablet 6  . Multiple Vitamins-Minerals (CENTRUM SILVER PO) Take 1 tablet by mouth daily.    Marland Kitchen PARoxetine (PAXIL) 40 MG tablet Take 40 mg by mouth daily.    . tacrolimus (PROTOPIC) 0.1 % ointment as needed.     . triamcinolone lotion (KENALOG) 0.1 % Apply 1 application topically 2 (two) times daily as needed. With Silver Sulfadiazine 1%     No current facility-administered medications for this  visit.    Allergies:  Cyclobenzaprine   Social History: The patient  reports that she has never smoked. She has never used smokeless tobacco. She reports that she does not drink alcohol or use drugs.   ROS:  Please see the history of present illness. Otherwise, complete review of systems is positive for none.  All other systems are reviewed and negative.   Physical Exam: VS:  BP 128/62   Pulse 61   Ht 5' (1.524 m)   Wt 144 lb (65.3 kg)   SpO2 91%   BMI 28.12 kg/m , BMI Body mass index is 28.12 kg/m.  Wt Readings from Last 3 Encounters:  02/01/17 144 lb (65.3 kg)  11/10/16 138 lb 1 oz (62.6 kg)  07/22/16 140 lb (63.5  kg)    Appears comfortable.  HEENT: Conjunctiva and lids normal, oropharynx clear.  Neck: Supple, no elevated JVP or carotid bruits, no thyromegaly.  Lungs: Clear to auscultation, nonlabored breathing at rest.  Cardiac: Regular rate and rhythm, no S3 or significant systolic murmur, no pericardial rub.  Abdomen: NABS.  Extremities: No pitting edema, distal pulses 2+.  ECG: I personally reviewed the tracing from 07/22/2016 which showed sinus rhythm with increased voltage, repolarization abnormalities, decreased R wave progression.  Recent Labwork:  March 2018: BUN 20, creatinine 0.56, AST 26, ALT 22, potassium 3.7, hemoglobin 14.7, platelets 192  Other Studies Reviewed Today:  Carotid Dopplers 12/08/2015 [facility]): Less than 50% bilateral ICA stenoses.  Echocardiogram 02/28/2013: Study Conclusions  - Left ventricle: The cavity size was normal. Wall thickness was normal. Systolic function was normal. The estimated ejection fraction was in the range of 60% to 65%. Wall motion was normal; there were no regional wall motion abnormalities. Doppler parameters are consistent with abnormal left ventricular relaxation (grade 1 diastolic dysfunction). - Right atrium: Central venous pressure: 3mm Hg (est). - Tricuspid valve: Physiologic regurgitation. - Pulmonary arteries: PA peak pressure: 25mm Hg (S). - Pericardium, extracardiac: There was no pericardial effusion. Impressions:  - Comparison to previous study June 2014. There has been normalization of LV systolic function, LVEF now 60-65%. Grade 1 diastolic dysfunction. Normal PASP, no pericardial effusion.  Assessment and Plan:  1. History of Tako-tsubo cardiomyopathy with normalization of LVEF. She remains symptomatically stable on medical therapy. Would continue to hold off on follow-up echocardiogram unless there is a significant clinical change in cardiac status.  2. Essential  hypertension, blood pressure well controlled today.  3. Hyperlipidemia, continues on Lipitor with follow-up per Dr. Sherryll BurgerShah.  Current medicines were reviewed with the patient today.  Disposition: Follow-up in 6 months.  Signed, Jonelle SidleSamuel G. McDowell, MD, [facility] Center[facility]ACC 02/01/2017 10:57 AM    [facility] [address], [place], [place] [address] Phone: [phone]; Fax: [phone]

## 2017-02-01 ENCOUNTER — Encounter: Payer: Self-pay | Admitting: Cardiology

## 2017-02-01 ENCOUNTER — Ambulatory Visit (INDEPENDENT_AMBULATORY_CARE_PROVIDER_SITE_OTHER): Payer: Medicare Other | Admitting: Cardiology

## 2017-02-01 VITALS — BP 128/62 | HR 61 | Ht 60.0 in | Wt 144.0 lb

## 2017-02-01 DIAGNOSIS — Z8679 Personal history of other diseases of the circulatory system: Secondary | ICD-10-CM | POA: Diagnosis not present

## 2017-02-01 DIAGNOSIS — E782 Mixed hyperlipidemia: Secondary | ICD-10-CM | POA: Diagnosis not present

## 2017-02-01 DIAGNOSIS — I10 Essential (primary) hypertension: Secondary | ICD-10-CM

## 2017-02-01 NOTE — Patient Instructions (Signed)

## 2017-02-24 DIAGNOSIS — I10 Essential (primary) hypertension: Secondary | ICD-10-CM | POA: Diagnosis not present

## 2017-02-24 DIAGNOSIS — F329 Major depressive disorder, single episode, unspecified: Secondary | ICD-10-CM | POA: Diagnosis not present

## 2017-02-24 DIAGNOSIS — Z299 Encounter for prophylactic measures, unspecified: Secondary | ICD-10-CM | POA: Diagnosis not present

## 2017-02-24 DIAGNOSIS — F419 Anxiety disorder, unspecified: Secondary | ICD-10-CM | POA: Diagnosis not present

## 2017-02-24 DIAGNOSIS — I739 Peripheral vascular disease, unspecified: Secondary | ICD-10-CM | POA: Diagnosis not present

## 2017-02-24 DIAGNOSIS — L409 Psoriasis, unspecified: Secondary | ICD-10-CM | POA: Diagnosis not present

## 2017-02-24 DIAGNOSIS — Z6827 Body mass index (BMI) 27.0-27.9, adult: Secondary | ICD-10-CM | POA: Diagnosis not present

## 2017-02-24 DIAGNOSIS — E7800 Pure hypercholesterolemia, unspecified: Secondary | ICD-10-CM | POA: Diagnosis not present

## 2017-02-24 DIAGNOSIS — E1165 Type 2 diabetes mellitus with hyperglycemia: Secondary | ICD-10-CM | POA: Diagnosis not present

## 2017-02-24 DIAGNOSIS — K760 Fatty (change of) liver, not elsewhere classified: Secondary | ICD-10-CM | POA: Diagnosis not present

## 2017-02-24 DIAGNOSIS — R569 Unspecified convulsions: Secondary | ICD-10-CM | POA: Diagnosis not present

## 2017-02-24 DIAGNOSIS — F0390 Unspecified dementia without behavioral disturbance: Secondary | ICD-10-CM | POA: Diagnosis not present

## 2017-03-03 DIAGNOSIS — E7800 Pure hypercholesterolemia, unspecified: Secondary | ICD-10-CM | POA: Diagnosis not present

## 2017-03-03 DIAGNOSIS — I10 Essential (primary) hypertension: Secondary | ICD-10-CM | POA: Diagnosis not present

## 2017-03-03 DIAGNOSIS — E119 Type 2 diabetes mellitus without complications: Secondary | ICD-10-CM | POA: Diagnosis not present

## 2017-03-08 DIAGNOSIS — Z1231 Encounter for screening mammogram for malignant neoplasm of breast: Secondary | ICD-10-CM | POA: Diagnosis not present

## 2017-03-22 DIAGNOSIS — E7800 Pure hypercholesterolemia, unspecified: Secondary | ICD-10-CM | POA: Diagnosis not present

## 2017-03-22 DIAGNOSIS — E119 Type 2 diabetes mellitus without complications: Secondary | ICD-10-CM | POA: Diagnosis not present

## 2017-03-22 DIAGNOSIS — I10 Essential (primary) hypertension: Secondary | ICD-10-CM | POA: Diagnosis not present

## 2017-04-12 ENCOUNTER — Encounter: Payer: Self-pay | Admitting: Neurology

## 2017-04-12 ENCOUNTER — Ambulatory Visit (INDEPENDENT_AMBULATORY_CARE_PROVIDER_SITE_OTHER): Payer: Medicare Other | Admitting: Neurology

## 2017-04-12 VITALS — BP 104/64 | HR 58 | Ht 60.0 in | Wt 139.1 lb

## 2017-04-12 DIAGNOSIS — F0280 Dementia in other diseases classified elsewhere without behavioral disturbance: Secondary | ICD-10-CM | POA: Diagnosis not present

## 2017-04-12 DIAGNOSIS — R6889 Other general symptoms and signs: Secondary | ICD-10-CM

## 2017-04-12 DIAGNOSIS — G301 Alzheimer's disease with late onset: Secondary | ICD-10-CM | POA: Diagnosis not present

## 2017-04-12 MED ORDER — MEMANTINE HCL ER 28 MG PO CP24: 28.0000 mg | ORAL_CAPSULE | Freq: Every day | ORAL | 5 refills | Status: DC

## 2017-04-12 MED ORDER — LEVETIRACETAM 500 MG PO TABS: 500.0000 mg | ORAL_TABLET | Freq: Two times a day (BID) | ORAL | 5 refills | Status: DC

## 2017-04-12 NOTE — Patient Instructions (Signed)
1.  Continue leveteracetam 500mg  twice daily and memantine ER 28mg  daily  RESOURCES: [facility] [place]  Tel : [phone]  Tel [place]:  [phone]  www.senior-resources[place].org/resources.cfm   Resources for common questions found under "Pathways & Protocols "  www.senior-resources[place].org/pathways/Pathways_Menu.htm   Alzheimer's Association website:  Western[employer]unes.italz.org  Resources for [facility] Nursing [facility] and Assisted Living facilities:  www.ncnursinghomeguide.com   For assistance with senior care, elder law, and estate planning (POA, medical directives):  [employer]  [address]  [place], [place] [address]  Tel: [phone](336) [phone]  [email or web address].[email or web address]   Rolin BarryKristy Andraos  Tel: [phone](336) [phone]  [email or web address]   Follow up in 6 months.

## 2017-04-12 NOTE — Progress Notes (Signed)
NEUROLOGY FOLLOW UP OFFICE NOTE  Jennifer [name] [id number]  HISTORY OF PRESENT ILLNESS: Jennifer [name] is a 76 year old right-handed woman with hypertension, Takotsubo cardiomyopathy, and hyperlipidemia who follows up for Alzheimer's dementia.  She is accompanied by her brother who supplements history.     UPDATE: She is on Namenda XR 28mg  daily for Alzheimer's.  Aricept was discontinued because it may lower seizure threshold. She is on Keppra 500mg  twice daily for possible complex partial seizures.  She has not had any observed spells.   HISTORY: She began having noticeable memory problems in late 2014 after some medical issues, such as kidney stones, MI and back surgery.  She began having problems remembering how to get to familiar places, such as the dentist or friend's house.  She also reports difficulty remembering names of people she has known for years.  She denies difficulty recognizing faces.  She denies hallucinations, poor sleep or depression.  She has not exhibited episodes of agitation or acute confusion.  Her brother lives with her.  Her son lives next door.  Her brother now handles her bills and finances.  She no longer drives. She performs all of her ADLs and keeps the house tidy.  She exhibits no change in behavior or personality.   In May 2016, she had an episode of unresponsiveness lasting several minutes.  She was with her son when suddenly her head dropped with eyes still open but zoned out.  She was unresponsive and did not talk.  She did not exhibit any twitching or incontinence.  She had an MRI of the brain performed on 11/13/14 which showed age appropriate atrophy and mild chronic small vessel disease.  Carotid doppler performed on 11/25/14 showed less than 50% bilateral ICA stenosis.  She has had two other similar spells only lasting about 30 seconds.  EEG performed on 01/15/15, which was normal.  She had no recurrent spell until this past March.  She presented to the [facility] ED on 08/24/16 for further evaluation.  MRI of brain was personally reviewed and revealed mild chronic small vessel disease but no acute stroke or mass lesion.   Both parents died in their 28s and had no history of dementia.  PAST MEDICAL HISTORY: Past Medical History:  Diagnosis Date  . Anxiety   . Anxiety   . Coronary atherosclerosis of native coronary artery    Nonobstructive at catheterization June 2014  . DDD (degenerative disc disease), lumbar   . Depression   . Diabetes mellitus (HCC)    Borderline  . Essential hypertension, benign   . Hyperlipidemia   . Nephrolithiasis   . Psoriatic arthritis (HCC)   . Takotsubo cardiomyopathy    LVEF 60-65% September 2014    MEDICATIONS: Current Outpatient Prescriptions on File Prior to Visit  Medication Sig Dispense Refill  . aspirin 81 MG EC tablet Take 1 tablet (81 mg total) by mouth daily. Swallow whole. 30 tablet 12  . atorvastatin (LIPITOR) 10 MG tablet TAKE 1 TABLET AT 6:00 P.M. 30 tablet 3  . Biotin 5000 MCG CAPS Take 10,000 mcg by mouth daily.     . cholecalciferol (VITAMIN D) 1000 units tablet Take 1,000 Units by mouth daily.    . clobetasol cream (TEMOVATE) 0.05 % Apply 1 application topically 2 (two) times daily.    Marland Kitchen desonide (DESOWEN) 0.05 % ointment Apply 1 application topically as needed.     . fish oil-omega-3 fatty acids 1000 MG capsule Take 2 g by  mouth daily.     . Hypromellose (ARTIFICIAL TEARS OP) Place 2 drops into both eyes daily as needed (dry eyes).    . Ixekizumab (TALTZ) 80 MG/ML SOAJ Inject into the skin.    Marland Kitchen losartan-hydrochlorothiazide (HYZAAR) 50-12.5 MG per tablet Take 1 tablet by mouth daily.    . metoprolol succinate (TOPROL-XL) 25 MG 24 hr tablet TAKE (1/2) TABLET BY MOUTH DAILY. 15 tablet 6  . Multiple Vitamins-Minerals (CENTRUM SILVER PO) Take 1 tablet by mouth daily.    Marland Kitchen PARoxetine (PAXIL) 40 MG tablet Take 40 mg by mouth daily.    . tacrolimus (PROTOPIC) 0.1 % ointment as needed.       . triamcinolone lotion (KENALOG) 0.1 % Apply 1 application topically 2 (two) times daily as needed. With Silver Sulfadiazine 1%     No current facility-administered medications on file prior to visit.     ALLERGIES: Allergies  Allergen Reactions  . Cyclobenzaprine     TOO STRONG  CAN'T USE AND WORK    FAMILY HISTORY: Family History  Problem Relation Age of Onset  . CAD Father   . CAD Mother   . Hypertension Mother   . Breast cancer Mother   . Hypertension Brother         X 2  . Cancer Maternal Grandmother        breast cancer     SOCIAL HISTORY: Social History   Social History  . Marital status: Married    Spouse name: N/A  . Number of children: N/A  . Years of education: N/A   Occupational History  . Not on file.   Social History Main Topics  . Smoking status: Never Smoker  . Smokeless tobacco: Never Used  . Alcohol use No  . Drug use: No  . Sexual activity: Not Currently   Other Topics Concern  . Not on file   Social History Narrative  . No narrative on file    REVIEW OF SYSTEMS: Constitutional: No fevers, chills, or sweats, no generalized fatigue, change in appetite Eyes: No visual changes, double vision, eye pain Ear, nose and throat: No hearing loss, ear pain, nasal congestion, sore throat Cardiovascular: No chest pain, palpitations Respiratory:  No shortness of breath at rest or with exertion, wheezes GastrointestinaI: No nausea, vomiting, diarrhea, abdominal pain, fecal incontinence Genitourinary:  No dysuria, urinary retention or frequency Musculoskeletal:  No neck pain, back pain Integumentary: No rash, pruritus, skin lesions Neurological: as above Psychiatric: No depression, insomnia, anxiety Endocrine: No palpitations, fatigue, diaphoresis, mood swings, change in appetite, change in weight, increased thirst Hematologic/Lymphatic:  No purpura, petechiae. Allergic/Immunologic: no itchy/runny eyes, nasal congestion, recent allergic reactions,  rashes  PHYSICAL EXAM: Vitals:   04/12/17 1113  BP: 104/64  Pulse: (!) 58  SpO2: 90%   General: No acute distress.  Patient appears well-groomed.  normal body habitus. Head:  Normocephalic/atraumatic Eyes:  Fundi examined but not visualized Neck: supple, no paraspinal tenderness, full range of motion Heart:  Regular rate and rhythm Lungs:  Clear to auscultation bilaterally Back: No paraspinal tenderness Neurological Exam: alert and oriented to person and place only. Attention span and concentration poor, recent memory poor, remote memory intact, fund of knowledge reduced.  Speech fluent and not dysarthric, language intact.  CN II-XII intact. Bulk and tone normal, muscle strength 5/5 throughout.  Sensation to light touch, temperature and vibration intact.  Deep tendon reflexes 2+ throughout, toes downgoing.  Finger to nose and heel to shin testing intact.  Gait  normal, Romberg negative.  IMPRESSION: Alzheimer's dementia Recurrent transient spells, possibly complex partial seizures  PLAN: Continue Namenda XR 28mg  daily Continue Keppra 500mg  twice daily Discussed need to establish POA Follow up in 6 months.  25 minutes spent face to face with patient, over 50% spent discussing management.  Shon MilletAdam Jair Lindblad, DO  CC: Kirstie PeriAshish Shah, MD

## 2017-05-02 DIAGNOSIS — L409 Psoriasis, unspecified: Secondary | ICD-10-CM | POA: Diagnosis not present

## 2017-05-02 DIAGNOSIS — Z5181 Encounter for therapeutic drug level monitoring: Secondary | ICD-10-CM | POA: Diagnosis not present

## 2017-05-02 DIAGNOSIS — L4050 Arthropathic psoriasis, unspecified: Secondary | ICD-10-CM | POA: Diagnosis not present

## 2017-05-02 DIAGNOSIS — Z79899 Other long term (current) drug therapy: Secondary | ICD-10-CM | POA: Diagnosis not present

## 2017-05-03 DIAGNOSIS — Z23 Encounter for immunization: Secondary | ICD-10-CM | POA: Diagnosis not present

## 2017-05-04 DIAGNOSIS — E7800 Pure hypercholesterolemia, unspecified: Secondary | ICD-10-CM | POA: Diagnosis not present

## 2017-05-04 DIAGNOSIS — I10 Essential (primary) hypertension: Secondary | ICD-10-CM | POA: Diagnosis not present

## 2017-05-04 DIAGNOSIS — E119 Type 2 diabetes mellitus without complications: Secondary | ICD-10-CM | POA: Diagnosis not present

## 2017-05-30 DIAGNOSIS — E119 Type 2 diabetes mellitus without complications: Secondary | ICD-10-CM | POA: Diagnosis not present

## 2017-05-30 DIAGNOSIS — E7800 Pure hypercholesterolemia, unspecified: Secondary | ICD-10-CM | POA: Diagnosis not present

## 2017-05-30 DIAGNOSIS — I10 Essential (primary) hypertension: Secondary | ICD-10-CM | POA: Diagnosis not present

## 2017-06-02 DIAGNOSIS — I739 Peripheral vascular disease, unspecified: Secondary | ICD-10-CM | POA: Diagnosis not present

## 2017-06-02 DIAGNOSIS — Z299 Encounter for prophylactic measures, unspecified: Secondary | ICD-10-CM | POA: Diagnosis not present

## 2017-06-02 DIAGNOSIS — L4050 Arthropathic psoriasis, unspecified: Secondary | ICD-10-CM | POA: Diagnosis not present

## 2017-06-02 DIAGNOSIS — Z6827 Body mass index (BMI) 27.0-27.9, adult: Secondary | ICD-10-CM | POA: Diagnosis not present

## 2017-06-02 DIAGNOSIS — R569 Unspecified convulsions: Secondary | ICD-10-CM | POA: Diagnosis not present

## 2017-06-02 DIAGNOSIS — E1165 Type 2 diabetes mellitus with hyperglycemia: Secondary | ICD-10-CM | POA: Diagnosis not present

## 2017-07-07 DIAGNOSIS — E7800 Pure hypercholesterolemia, unspecified: Secondary | ICD-10-CM | POA: Diagnosis not present

## 2017-07-07 DIAGNOSIS — I10 Essential (primary) hypertension: Secondary | ICD-10-CM | POA: Diagnosis not present

## 2017-07-07 DIAGNOSIS — E119 Type 2 diabetes mellitus without complications: Secondary | ICD-10-CM | POA: Diagnosis not present

## 2017-08-01 NOTE — Progress Notes (Signed)
Cardiology Office Note  Date: 08/03/2017   ID: Jennifer [name], DOB 12-04-1940, MRN [id number]  PCP: Kirstie PeriShah, Ashish, MD  Primary Cardiologist: Nona DellSamuel Belinda Schlichting, MD   Chief Complaint  Patient presents with  . History of cardiomyopathy    History of Present Illness: Jennifer [name] is a 77 y.o. female last seen in August 2018.  She presents with her brother for a follow-up visit.  From a cardiac perspective, no significant changes are reported.  She has not had any chest pain or progressing shortness of breath, no dizziness or syncope.  She continues to follow with Neurology with advancing Alzheimer's dementia and also possible complex partial seizures.  Patient's brother states that these issues seem to be relatively stable although she does require assistance on a regular basis.  I went over her medications.  Current cardiac regimen includes aspirin, Lipitor, Hyzaar, and Toprol-XL.  I personally reviewed her ECG today which shows sinus bradycardia with leftward axis and increased voltage.  Past Medical History:  Diagnosis Date  . Anxiety   . Anxiety   . Coronary atherosclerosis of native coronary artery    Nonobstructive at catheterization June 2014  . DDD (degenerative disc disease), lumbar   . Depression   . Diabetes mellitus (HCC)    Borderline  . Essential hypertension, benign   . Hyperlipidemia   . Nephrolithiasis   . Psoriatic arthritis (HCC)   . Takotsubo cardiomyopathy    LVEF 60-65% September 2014    Past Surgical History:  Procedure Laterality Date  . APPENDECTOMY    . CHOLECYSTECTOMY    . LEFT HEART CATHETERIZATION WITH CORONARY ANGIOGRAM N/A 12/18/2012   Procedure: LEFT HEART CATHETERIZATION WITH CORONARY ANGIOGRAM;  Surgeon: Tonny BollmanMichael Cooper, MD;  [facility];  Service: Cardiovascular;  Laterality: N/A;    Current Outpatient Medications  Medication Sig Dispense Refill  . aspirin 81 MG EC tablet Take 1 tablet (81 mg total) by mouth daily.  Swallow whole. 30 tablet 12  . atorvastatin (LIPITOR) 10 MG tablet TAKE 1 TABLET AT 6:00 P.M. 30 tablet 3  . Biotin 5000 MCG CAPS Take 10,000 mcg by mouth daily.     . calcium carbonate (OSCAL) 1500 (600 Ca) MG TABS tablet Take 600 mg of elemental calcium by mouth 2 (two) times daily with a meal.    . cholecalciferol (VITAMIN D) 1000 units tablet Take 1,000 Units by mouth daily.    . clobetasol cream (TEMOVATE) 0.05 % Apply 1 application topically 2 (two) times daily.    Marland Kitchen. desonide (DESOWEN) 0.05 % ointment Apply 1 application topically as needed.     . fish oil-omega-3 fatty acids 1000 MG capsule Take 2 g by mouth daily.     . Hypromellose (ARTIFICIAL TEARS OP) Place 2 drops into both eyes daily as needed (dry eyes).    . Ixekizumab (TALTZ) 80 MG/ML SOAJ Inject into the skin.    Marland Kitchen. levETIRAcetam (KEPPRA) 500 MG tablet Take 1 tablet (500 mg total) by mouth 2 (two) times daily. 60 tablet 5  . losartan-hydrochlorothiazide (HYZAAR) 50-12.5 MG per tablet Take 1 tablet by mouth daily.    . memantine (NAMENDA XR) 28 MG CP24 24 hr capsule Take 1 capsule (28 mg total) by mouth daily. 30 capsule 5  . metoprolol succinate (TOPROL-XL) 25 MG 24 hr tablet TAKE (1/2) TABLET BY MOUTH DAILY. 15 tablet 6  . Multiple Vitamins-Minerals (CENTRUM SILVER PO) Take 1 tablet by mouth daily.    Marland Kitchen. PARoxetine (PAXIL) 40 MG  tablet Take 40 mg by mouth daily.    . tacrolimus (PROTOPIC) 0.1 % ointment as needed.     . triamcinolone lotion (KENALOG) 0.1 % Apply 1 application topically 2 (two) times daily as needed. With Silver Sulfadiazine 1%     No current facility-administered medications for this visit.    Allergies:  Cyclobenzaprine   Social History: The patient  reports that  has never smoked. she has never used smokeless tobacco. She reports that she does not drink alcohol or use drugs.   ROS:  Please see the history of present illness. Otherwise, complete review of systems is positive for hearing and memory loss.  All  other systems are reviewed and negative.   Physical Exam: VS:  BP 112/62   Pulse (!) 50   Ht 5' (1.524 m)   Wt 140 lb (63.5 kg)   SpO2 90%   BMI 27.34 kg/m , BMI Body mass index is 27.34 kg/m.  Wt Readings from Last 3 Encounters:  08/03/17 140 lb (63.5 kg)  04/12/17 139 lb 2 oz (63.1 kg)  02/01/17 144 lb (65.3 kg)    General: Patient appears comfortable at rest. HEENT: Conjunctiva and lids normal, oropharynx clear. Neck: Supple, no elevated JVP or carotid bruits, no thyromegaly. Lungs: Clear to auscultation, nonlabored breathing at rest. Cardiac: Regular rate and rhythm, no S3 or significant systolic murmur, no pericardial rub. Abdomen: Soft, nontender, bowel sounds present. Extremities: No pitting edema, distal pulses 2+. Skin: Warm and dry. Musculoskeletal: No kyphosis. Neuropsychiatric: Alert and oriented x3, affect grossly appropriate.  ECG: I personally reviewed the tracing from 07/22/2016 which showed sinus rhythm with increased voltage, repolarization abnormalities, decreased R wave progression.  Recent Labwork:  March 2018: BUN 20, creatinine 0.56, AST 26, ALT 22, potassium 3.7, hemoglobin 14.7, platelets 192  Other Studies Reviewed Today:  Carotid Dopplers 12/08/2015 [facility]): Less than 50% bilateral ICA stenoses.  Echocardiogram9/03/2013: Study Conclusions  - Left ventricle: The cavity size was normal. Wall thickness was normal. Systolic function was normal. The estimated ejection fraction was in the range of 60% to 65%. Wall motion was normal; there were no regional wall motion abnormalities. Doppler parameters are consistent with abnormal left ventricular relaxation (grade 1 diastolic dysfunction). - Right atrium: Central venous pressure: 3mm Hg (est). - Tricuspid valve: Physiologic regurgitation. - Pulmonary arteries: PA peak pressure: 25mm Hg (S). - Pericardium, extracardiac: There was no  pericardial effusion. Impressions:  - Comparison to previous study June 2014. There has been normalization of LV systolic function, LVEF now 60-65%. Grade 1 diastolic dysfunction. Normal PASP, no pericardial effusion.  Assessment and Plan:  1.  History of Tako-tsubo cardiomyopathy with subsequent normalization of LVEF.  She remains stable on medical therapy and we continue with plan for observation at this time.  2.  Essential hypertension, blood pressure is well controlled today.  3.  Mixed hyperlipidemia, she continues on Lipitor.  Current medicines were reviewed with the patient today.   Orders Placed This Encounter  Procedures  . EKG 12-Lead    Disposition: Follow-up in 6 months.  Signed, Jonelle Sidle, MD, [facility] 08/03/2017 11:18 AM    [facility] at [facility] [address] [place], [place], [place] [address] Phone: [phone]; Fax: [phone]

## 2017-08-03 ENCOUNTER — Ambulatory Visit (INDEPENDENT_AMBULATORY_CARE_PROVIDER_SITE_OTHER): Payer: Medicare Other | Admitting: Cardiology

## 2017-08-03 ENCOUNTER — Encounter: Payer: Self-pay | Admitting: Cardiology

## 2017-08-03 VITALS — BP 112/62 | HR 50 | Ht 60.0 in | Wt 140.0 lb

## 2017-08-03 DIAGNOSIS — I10 Essential (primary) hypertension: Secondary | ICD-10-CM | POA: Diagnosis not present

## 2017-08-03 DIAGNOSIS — E782 Mixed hyperlipidemia: Secondary | ICD-10-CM | POA: Diagnosis not present

## 2017-08-03 DIAGNOSIS — Z8679 Personal history of other diseases of the circulatory system: Secondary | ICD-10-CM

## 2017-08-03 NOTE — Patient Instructions (Signed)

## 2017-09-06 DIAGNOSIS — I739 Peripheral vascular disease, unspecified: Secondary | ICD-10-CM | POA: Diagnosis not present

## 2017-09-06 DIAGNOSIS — E1165 Type 2 diabetes mellitus with hyperglycemia: Secondary | ICD-10-CM | POA: Diagnosis not present

## 2017-09-06 DIAGNOSIS — L4050 Arthropathic psoriasis, unspecified: Secondary | ICD-10-CM | POA: Diagnosis not present

## 2017-09-06 DIAGNOSIS — L409 Psoriasis, unspecified: Secondary | ICD-10-CM | POA: Diagnosis not present

## 2017-09-06 DIAGNOSIS — Z299 Encounter for prophylactic measures, unspecified: Secondary | ICD-10-CM | POA: Diagnosis not present

## 2017-09-06 DIAGNOSIS — Z789 Other specified health status: Secondary | ICD-10-CM | POA: Diagnosis not present

## 2017-09-06 DIAGNOSIS — I10 Essential (primary) hypertension: Secondary | ICD-10-CM | POA: Diagnosis not present

## 2017-09-06 DIAGNOSIS — Z6828 Body mass index (BMI) 28.0-28.9, adult: Secondary | ICD-10-CM | POA: Diagnosis not present

## 2017-09-20 DIAGNOSIS — I10 Essential (primary) hypertension: Secondary | ICD-10-CM | POA: Diagnosis not present

## 2017-09-20 DIAGNOSIS — E119 Type 2 diabetes mellitus without complications: Secondary | ICD-10-CM | POA: Diagnosis not present

## 2017-09-20 DIAGNOSIS — E7800 Pure hypercholesterolemia, unspecified: Secondary | ICD-10-CM | POA: Diagnosis not present

## 2017-10-11 ENCOUNTER — Ambulatory Visit (INDEPENDENT_AMBULATORY_CARE_PROVIDER_SITE_OTHER): Payer: Medicare Other | Admitting: Neurology

## 2017-10-11 ENCOUNTER — Encounter: Payer: Self-pay | Admitting: Neurology

## 2017-10-11 VITALS — BP 130/62 | HR 64 | Ht 60.0 in | Wt 143.6 lb

## 2017-10-11 DIAGNOSIS — F0280 Dementia in other diseases classified elsewhere without behavioral disturbance: Secondary | ICD-10-CM | POA: Diagnosis not present

## 2017-10-11 DIAGNOSIS — G301 Alzheimer's disease with late onset: Secondary | ICD-10-CM

## 2017-10-11 DIAGNOSIS — R6889 Other general symptoms and signs: Secondary | ICD-10-CM | POA: Diagnosis not present

## 2017-10-11 NOTE — Patient Instructions (Signed)
1.  Continue memantine (Namenda) and levetiracetam (Keppra) 2.  Follow up in 9 months

## 2017-10-11 NOTE — Progress Notes (Addendum)
NEUROLOGY FOLLOW UP OFFICE NOTE  Jennifer [name] [id number]  HISTORY OF PRESENT ILLNESS: Jennifer [name] is a 77 year old right-handed woman with hypertension, Takotsubo cardiomyopathy, and hyperlipidemia who follows up for Alzheimer's dementia and episodes of unresponsiveness.  She is accompanied by her brother who supplements history.     UPDATE: She is on Namenda XR 28mg  daily for Alzheimer's disease.  She is on Keppra 500mg  twice daily for possible complex partial seizures.  She has not had any recurrent spells.  She is doing well.  She sleeps well at night but unless she is engaged, she naps frequently during the day.  She is not agitated.  She dresses, bathes, and uses toilet unassisted.  Her mood is good.   HISTORY: She began having noticeable memory problems in late 2014 after some medical issues, such as kidney stones, MI and back surgery.  She began having problems remembering how to get to familiar places, such as the dentist or friend's house.  She also reports difficulty remembering names of people she has known for years.  She denies difficulty recognizing faces.  She denies hallucinations, poor sleep or depression.  She has not exhibited episodes of agitation or acute confusion.  Her brother lives with her.  Her son lives next door.  Her brother now handles her bills and finances.  She no longer drives. She performs all of her ADLs and keeps the house tidy.  She exhibits no change in behavior or personality.   In May 2016, she had an episode of unresponsiveness lasting several minutes.  She was with her son when suddenly her head dropped with eyes still open but zoned out.  She was unresponsive and did not talk.  She did not exhibit any twitching or incontinence.  She had an MRI of the brain performed on 11/13/14 which showed age appropriate atrophy and mild chronic small vessel disease.  Carotid doppler performed on 11/25/14 showed less than 50% bilateral ICA stenosis.  She has had two  other similar spells only lasting about 30 seconds.  EEG performed on 01/15/15, which was normal.  She had another spell in on 08/24/16.  [facility].  MRI of brain revealed mild chronic small vessel disease but no acute stroke or mass lesion.  .  Aricept was discontinued because it may lower seizure threshold.  Both parents died in their 9160s and had no history of dementia.  PAST MEDICAL HISTORY: Past Medical History:  Diagnosis Date  . Anxiety   . Anxiety   . Coronary atherosclerosis of native coronary artery    Nonobstructive at catheterization June 2014  . DDD (degenerative disc disease), lumbar   . Depression   . Diabetes mellitus (HCC)    Borderline  . Essential hypertension, benign   . Hyperlipidemia   . Nephrolithiasis   . Psoriatic arthritis (HCC)   . Takotsubo cardiomyopathy    LVEF 60-65% September 2014    MEDICATIONS: Current Outpatient Medications on File Prior to Visit  Medication Sig Dispense Refill  . calcium carbonate (OSCAL) 1500 (600 Ca) MG TABS tablet Take 1,500 mg by mouth daily with breakfast.    . aspirin 81 MG EC tablet Take 1 tablet (81 mg total) by mouth daily. Swallow whole. 30 tablet 12  . atorvastatin (LIPITOR) 10 MG tablet TAKE 1 TABLET AT 6:00 P.M. 30 tablet 3  . Biotin 5000 MCG CAPS Take 10,000 mcg by mouth daily.     .Marland Kitchen  calcium carbonate (OSCAL) 1500 (600 Ca) MG TABS tablet Take 600 mg of elemental calcium by mouth 2 (two) times daily with a meal.    . cholecalciferol (VITAMIN D) 1000 units tablet Take 1,000 Units by mouth daily.    . clobetasol cream (TEMOVATE) 0.05 % Apply 1 application topically 2 (two) times daily.    Marland Kitchen desonide (DESOWEN) 0.05 % ointment Apply 1 application topically as needed.     . fish oil-omega-3 fatty acids 1000 MG capsule Take 2 g by mouth daily.     . Hypromellose (ARTIFICIAL TEARS OP) Place 2 drops into both eyes daily as needed (dry eyes).    . Ixekizumab (TALTZ) 80 MG/ML  SOAJ Inject into the skin.    Marland Kitchen levETIRAcetam (KEPPRA) 500 MG tablet Take 1 tablet (500 mg total) by mouth 2 (two) times daily. 60 tablet 5  . losartan-hydrochlorothiazide (HYZAAR) 50-12.5 MG per tablet Take 1 tablet by mouth daily.    . memantine (NAMENDA XR) 28 MG CP24 24 hr capsule Take 1 capsule (28 mg total) by mouth daily. 30 capsule 5  . metoprolol succinate (TOPROL-XL) 25 MG 24 hr tablet TAKE (1/2) TABLET BY MOUTH DAILY. 15 tablet 6  . Multiple Vitamins-Minerals (CENTRUM SILVER PO) Take 1 tablet by mouth daily.    Marland Kitchen PARoxetine (PAXIL) 40 MG tablet Take 40 mg by mouth daily.    . tacrolimus (PROTOPIC) 0.1 % ointment as needed.     . triamcinolone lotion (KENALOG) 0.1 % Apply 1 application topically 2 (two) times daily as needed. With Silver Sulfadiazine 1%     No current facility-administered medications on file prior to visit.     ALLERGIES: Allergies  Allergen Reactions  . Cyclobenzaprine     TOO STRONG  CAN'T USE AND WORK    FAMILY HISTORY: Family History  Problem Relation Age of Onset  . CAD Father   . CAD Mother   . Hypertension Mother   . Breast cancer Mother   . Hypertension Brother         X 2  . Cancer Maternal Grandmother        breast cancer     SOCIAL HISTORY: Social History   Socioeconomic History  . Marital status: Married    Spouse name: Not on file  . Number of children: Not on file  . Years of education: Not on file  . Highest education level: Not on file  Occupational History  . Not on file  Social Needs  . Financial resource strain: Not on file  . Food insecurity:    Worry: Not on file    Inability: Not on file  . Transportation needs:    Medical: Not on file    Non-medical: Not on file  Tobacco Use  . Smoking status: Never Smoker  . Smokeless tobacco: Never Used  Substance and Sexual Activity  . Alcohol use: No    Alcohol/week: 0.0 oz  . Drug use: No  . Sexual activity: Not Currently  Lifestyle  . Physical activity:    Days per  week: Not on file    Minutes per session: Not on file  . Stress: Not on file  Relationships  . Social connections:    Talks on phone: Not on file    Gets together: Not on file    Attends religious service: Not on file    Active member of club or organization: Not on file    Attends meetings of clubs or organizations: Not on file  Relationship status: Not on file  . Intimate partner violence:    Fear of current or ex partner: Not on file    Emotionally abused: Not on file    Physically abused: Not on file    Forced sexual activity: Not on file  Other Topics Concern  . Not on file  Social History Narrative  . Not on file    REVIEW OF SYSTEMS: Constitutional: No fevers, chills, or sweats, no generalized fatigue, change in appetite Eyes: No visual changes, double vision, eye pain Ear, nose and throat: No hearing loss, ear pain, nasal congestion, sore throat Cardiovascular: No chest pain, palpitations Respiratory:  No shortness of breath at rest or with exertion, wheezes GastrointestinaI: No nausea, vomiting, diarrhea, abdominal pain, fecal incontinence Genitourinary:  No dysuria, urinary retention or frequency Musculoskeletal:  No neck pain, back pain Integumentary: No rash, pruritus, skin lesions Neurological: as above Psychiatric: No depression, insomnia, anxiety Endocrine: No palpitations, fatigue, diaphoresis, mood swings, change in appetite, change in weight, increased thirst Hematologic/Lymphatic:  No purpura, petechiae. Allergic/Immunologic: no itchy/runny eyes, nasal congestion, recent allergic reactions, rashes  PHYSICAL EXAM: Vitals:   10/11/17 1123  BP: 130/62  Pulse: 64  SpO2: 95%   General: No acute distress.  Patient appears well-groomed.   Head:  Normocephalic/atraumatic Eyes:  Fundi examined but not visualized Neck: supple, no paraspinal tenderness, full range of motion Heart:  Regular rate and rhythm Lungs:  Clear to auscultation bilaterally Back: No  paraspinal tenderness Neurological Exam: alert and oriented to person and place only. Attention span and concentration poor, recent memory poor, remote memory intact, fund of knowledge reduced.  Speech fluent and not dysarthric, language intact. MMSE - Mini Mental State Exam 10/11/2017 04/12/2017 11/10/2016  Not completed: - Unable to complete -  Orientation to time 1 - 0  Orientation to Place 3 - 4  Registration 3 - 3  Attention/ Calculation 1 - 0  Recall 0 - 0  Language- name 2 objects 2 - 2  Language- repeat 1 - 1  Language- follow 3 step command 3 - 3  Language- read & follow direction 1 - 1  Write a sentence 1 - 1  Copy design 0 - 0  Total score 16 - 15     Left irregular non-reactive surgical pupil.  Otherwise, CN II-XII intact. Bulk and tone normal, muscle strength 5/5 throughout.  Sensation to light touch, temperature and vibration intact.  Deep tendon reflexes 2+ throughout, toes downgoing.  Finger to nose and heel to shin testing intact.  Gait normal, Romberg negative.  IMPRESSION: Alzheimer's dementia Recurrent transient spells, possibly complex partial seizures  PLAN: Continue Namenda XR 28mg  daily Continue Keppra 500mg  twice daily Discussed need to establish POA Follow up in 6 months.  27 minutes spent face to face with patient, over 50% spent discussing management.  Shon Millet, DO  CC:  Burnett Kanaris, MD

## 2017-10-18 ENCOUNTER — Encounter: Payer: Self-pay | Admitting: Neurology

## 2017-11-28 DIAGNOSIS — Z7189 Other specified counseling: Secondary | ICD-10-CM | POA: Diagnosis not present

## 2017-11-28 DIAGNOSIS — Z79899 Other long term (current) drug therapy: Secondary | ICD-10-CM | POA: Diagnosis not present

## 2017-11-28 DIAGNOSIS — Z1331 Encounter for screening for depression: Secondary | ICD-10-CM | POA: Diagnosis not present

## 2017-11-28 DIAGNOSIS — Z1211 Encounter for screening for malignant neoplasm of colon: Secondary | ICD-10-CM | POA: Diagnosis not present

## 2017-11-28 DIAGNOSIS — R5383 Other fatigue: Secondary | ICD-10-CM | POA: Diagnosis not present

## 2017-11-28 DIAGNOSIS — I739 Peripheral vascular disease, unspecified: Secondary | ICD-10-CM | POA: Diagnosis not present

## 2017-11-28 DIAGNOSIS — E7800 Pure hypercholesterolemia, unspecified: Secondary | ICD-10-CM | POA: Diagnosis not present

## 2017-11-28 DIAGNOSIS — E1165 Type 2 diabetes mellitus with hyperglycemia: Secondary | ICD-10-CM | POA: Diagnosis not present

## 2017-11-28 DIAGNOSIS — Z0000 Encounter for general adult medical examination without abnormal findings: Secondary | ICD-10-CM | POA: Diagnosis not present

## 2017-11-28 DIAGNOSIS — E559 Vitamin D deficiency, unspecified: Secondary | ICD-10-CM | POA: Diagnosis not present

## 2017-11-28 DIAGNOSIS — Z299 Encounter for prophylactic measures, unspecified: Secondary | ICD-10-CM | POA: Diagnosis not present

## 2017-11-28 DIAGNOSIS — Z1339 Encounter for screening examination for other mental health and behavioral disorders: Secondary | ICD-10-CM | POA: Diagnosis not present

## 2017-11-28 DIAGNOSIS — F0390 Unspecified dementia without behavioral disturbance: Secondary | ICD-10-CM | POA: Diagnosis not present

## 2017-11-28 DIAGNOSIS — Z6827 Body mass index (BMI) 27.0-27.9, adult: Secondary | ICD-10-CM | POA: Diagnosis not present

## 2017-11-30 DIAGNOSIS — E7800 Pure hypercholesterolemia, unspecified: Secondary | ICD-10-CM | POA: Diagnosis not present

## 2017-11-30 DIAGNOSIS — E119 Type 2 diabetes mellitus without complications: Secondary | ICD-10-CM | POA: Diagnosis not present

## 2017-11-30 DIAGNOSIS — I10 Essential (primary) hypertension: Secondary | ICD-10-CM | POA: Diagnosis not present

## 2017-12-30 DIAGNOSIS — I2510 Atherosclerotic heart disease of native coronary artery without angina pectoris: Secondary | ICD-10-CM | POA: Diagnosis not present

## 2017-12-30 DIAGNOSIS — E1165 Type 2 diabetes mellitus with hyperglycemia: Secondary | ICD-10-CM | POA: Diagnosis not present

## 2017-12-30 DIAGNOSIS — E7800 Pure hypercholesterolemia, unspecified: Secondary | ICD-10-CM | POA: Diagnosis not present

## 2017-12-30 DIAGNOSIS — Z299 Encounter for prophylactic measures, unspecified: Secondary | ICD-10-CM | POA: Diagnosis not present

## 2017-12-30 DIAGNOSIS — Z6828 Body mass index (BMI) 28.0-28.9, adult: Secondary | ICD-10-CM | POA: Diagnosis not present

## 2017-12-30 DIAGNOSIS — I10 Essential (primary) hypertension: Secondary | ICD-10-CM | POA: Diagnosis not present

## 2018-01-26 DIAGNOSIS — E119 Type 2 diabetes mellitus without complications: Secondary | ICD-10-CM | POA: Diagnosis not present

## 2018-01-26 DIAGNOSIS — E7800 Pure hypercholesterolemia, unspecified: Secondary | ICD-10-CM | POA: Diagnosis not present

## 2018-01-26 DIAGNOSIS — I10 Essential (primary) hypertension: Secondary | ICD-10-CM | POA: Diagnosis not present

## 2018-02-02 NOTE — Progress Notes (Signed)
Cardiology Office Note  Date: 02/03/2018   ID: Jennifer GripBrenda E Gallery, DOB July 15, 1940, MRN [id number]  PCP: Kirstie PeriShah, Ashish, MD  Primary Cardiologist: Nona DellSamuel McDowell, MD   Chief Complaint  Patient presents with  . Cardiac follow-up    History of Present Illness: Jennifer [name] is a 77 y.o. female last seen in February.  She is here with her brother for a routine visit.  He is very attentive, takes her out regularly to shop, they also eat lunch together.  She does not report any obvious decline in stamina, no exertional chest pain.  She is having some arthritic symptoms.  She has maintained follow-up with Neurology for management of Alzheimer's dementia and possible complex partial seizures.  Current cardiac regimen includes aspirin, Lipitor, Toprol-XL, and Hyzaar.  Blood pressure and heart rate are well controlled today.  She had follow-up lab work with her PCP, results being requested.  Past Medical History:  Diagnosis Date  . Anxiety   . Anxiety   . Coronary atherosclerosis of native coronary artery    Nonobstructive at catheterization June 2014  . DDD (degenerative disc disease), lumbar   . Depression   . Diabetes mellitus (HCC)    Borderline  . Essential hypertension, benign   . Hyperlipidemia   . Nephrolithiasis   . Psoriatic arthritis (HCC)   . Takotsubo cardiomyopathy    LVEF 60-65% September 2014    Past Surgical History:  Procedure Laterality Date  . APPENDECTOMY    . CHOLECYSTECTOMY    . LEFT HEART CATHETERIZATION WITH CORONARY ANGIOGRAM N/A 12/18/2012   Procedure: LEFT HEART CATHETERIZATION WITH CORONARY ANGIOGRAM;  Surgeon: Tonny BollmanMichael Cooper, MD;  [facility];  Service: Cardiovascular;  Laterality: N/A;    Current Outpatient Medications  Medication Sig Dispense Refill  . aspirin 81 MG EC tablet Take 1 tablet (81 mg total) by mouth daily. Swallow whole. 30 tablet 12  . atorvastatin (LIPITOR) 10 MG tablet TAKE 1 TABLET AT 6:00 P.M. 30 tablet 3  .  Biotin 5000 MCG CAPS Take 10,000 mcg by mouth daily.     . calcium carbonate (OSCAL) 1500 (600 Ca) MG TABS tablet Take 600 mg of elemental calcium by mouth 2 (two) times daily with a meal.    . calcium carbonate (OSCAL) 1500 (600 Ca) MG TABS tablet Take 1,500 mg by mouth daily with breakfast.    . cholecalciferol (VITAMIN D) 1000 units tablet Take 1,000 Units by mouth daily.    . clobetasol cream (TEMOVATE) 0.05 % Apply 1 application topically 2 (two) times daily.    Marland Kitchen. desonide (DESOWEN) 0.05 % ointment Apply 1 application topically as needed.     . fish oil-omega-3 fatty acids 1000 MG capsule Take 2 g by mouth daily.     . Hypromellose (ARTIFICIAL TEARS OP) Place 2 drops into both eyes daily as needed (dry eyes).    . Ixekizumab (TALTZ) 80 MG/ML SOAJ Inject into the skin.    Marland Kitchen. levETIRAcetam (KEPPRA) 500 MG tablet Take 1 tablet (500 mg total) by mouth 2 (two) times daily. 60 tablet 5  . losartan-hydrochlorothiazide (HYZAAR) 50-12.5 MG per tablet Take 1 tablet by mouth daily.    . memantine (NAMENDA XR) 28 MG CP24 24 hr capsule Take 1 capsule (28 mg total) by mouth daily. 30 capsule 5  . metoprolol succinate (TOPROL-XL) 25 MG 24 hr tablet TAKE (1/2) TABLET BY MOUTH DAILY. 15 tablet 6  . Multiple Vitamins-Minerals (CENTRUM SILVER PO) Take 1 tablet by mouth daily.    .Marland Kitchen  PARoxetine (PAXIL) 40 MG tablet Take 40 mg by mouth daily.    . tacrolimus (PROTOPIC) 0.1 % ointment as needed.     . triamcinolone lotion (KENALOG) 0.1 % Apply 1 application topically 2 (two) times daily as needed. With Silver Sulfadiazine 1%     No current facility-administered medications for this visit.    Allergies:  Cyclobenzaprine   Social History: The patient  reports that she has never smoked. She has never used smokeless tobacco. She reports that she does not drink alcohol or use drugs.   ROS:  Please see the history of present illness. Otherwise, complete review of systems is positive for memory loss, arthritic  stiffness.  All other systems are reviewed and negative.   Physical Exam: VS:  BP 108/70   Pulse (!) 54   Ht 5' (1.524 m)   Wt 142 lb (64.4 kg)   SpO2 90%   BMI 27.73 kg/m , BMI Body mass index is 27.73 kg/m.  Wt Readings from Last 3 Encounters:  02/03/18 142 lb (64.4 kg)  10/11/17 143 lb 9.6 oz (65.1 kg)  08/03/17 140 lb (63.5 kg)    General: Elderly woman, appears comfortable at rest. HEENT: Conjunctiva and lids normal, oropharynx clear. Neck: Supple, no elevated JVP or carotid bruits, no thyromegaly. Lungs: Clear to auscultation, nonlabored breathing at rest. Cardiac: Regular rate and rhythm, no S3 or significant systolic murmur, no pericardial rub. Abdomen: Soft, nontender, bowel sounds present. Extremities: No pitting edema, distal pulses 2+. Skin: Warm and dry. Musculoskeletal: No kyphosis. Neuropsychiatric: Alert and oriented x3, affect grossly appropriate.  ECG: I personally reviewed the tracing from 08/03/2017 which showed sinus bradycardia with leftward axis and increased voltage.  Recent Labwork:  March 2018: BUN 20, creatinine 0.56, AST 26, ALT 22, potassium 3.7, hemoglobin 14.7, platelets 192  Other Studies Reviewed Today:  Carotid Dopplers 12/08/2015 [facility] [facility] Less than 50% bilateral ICA stenoses.  Echocardiogram9/03/2013: Study Conclusions  - Left ventricle: The cavity size was normal. Wall thickness was normal. Systolic function was normal. The estimated ejection fraction was in the range of 60% to 65%. Wall motion was normal; there were no regional wall motion abnormalities. Doppler parameters are consistent with abnormal left ventricular relaxation (grade 1 diastolic dysfunction). - Right atrium: Central venous pressure: 3mm Hg (est). - Tricuspid valve: Physiologic regurgitation. - Pulmonary arteries: PA peak pressure: 25mm Hg (S). - Pericardium, extracardiac: There was no pericardial effusion. Impressions:  -  Comparison to previous study June 2014. There has been normalization of LV systolic function, LVEF now 60-65%. Grade 1 diastolic dysfunction. Normal PASP, no pericardial effusion.  Assessment and Plan:  1.  History of Takotsubo cardiomyopathy with normalization of LVEF.  She remains clinically stable on medical therapy, no clear indication for follow-up echocardiogram at this time.  2.  Essential hypertension, blood pressure well controlled today.  No changes were made.  3.  Mixed hyperlipidemia, on Lipitor.  Requesting most recent lab work from PCP.  4.  Alzheimer's dementia, she continues to follow with neurology.  Current medicines were reviewed with the patient today.  Disposition: Follow-up in 6 months.  Signed, Jonelle SidleSamuel G. McDowell, MD, [facility] 02/03/2018 10:03 AM    [facility] [address], [place], [place] [address] Phone: [phone]; Fax: [phone]

## 2018-02-03 ENCOUNTER — Ambulatory Visit (INDEPENDENT_AMBULATORY_CARE_PROVIDER_SITE_OTHER): Payer: Medicare Other | Admitting: Cardiology

## 2018-02-03 ENCOUNTER — Encounter: Payer: Self-pay | Admitting: Cardiology

## 2018-02-03 VITALS — BP 108/70 | HR 54 | Ht 60.0 in | Wt 142.0 lb

## 2018-02-03 DIAGNOSIS — Z8679 Personal history of other diseases of the circulatory system: Secondary | ICD-10-CM | POA: Diagnosis not present

## 2018-02-03 DIAGNOSIS — I10 Essential (primary) hypertension: Secondary | ICD-10-CM

## 2018-02-03 DIAGNOSIS — E782 Mixed hyperlipidemia: Secondary | ICD-10-CM | POA: Diagnosis not present

## 2018-02-03 DIAGNOSIS — F0280 Dementia in other diseases classified elsewhere without behavioral disturbance: Secondary | ICD-10-CM

## 2018-02-03 DIAGNOSIS — G309 Alzheimer's disease, unspecified: Secondary | ICD-10-CM | POA: Diagnosis not present

## 2018-02-03 NOTE — Patient Instructions (Signed)

## 2018-02-22 DIAGNOSIS — E7800 Pure hypercholesterolemia, unspecified: Secondary | ICD-10-CM | POA: Diagnosis not present

## 2018-02-22 DIAGNOSIS — I10 Essential (primary) hypertension: Secondary | ICD-10-CM | POA: Diagnosis not present

## 2018-02-22 DIAGNOSIS — E119 Type 2 diabetes mellitus without complications: Secondary | ICD-10-CM | POA: Diagnosis not present

## 2018-03-17 DIAGNOSIS — Z1231 Encounter for screening mammogram for malignant neoplasm of breast: Secondary | ICD-10-CM | POA: Diagnosis not present

## 2018-03-27 DIAGNOSIS — E119 Type 2 diabetes mellitus without complications: Secondary | ICD-10-CM | POA: Diagnosis not present

## 2018-03-27 DIAGNOSIS — E7800 Pure hypercholesterolemia, unspecified: Secondary | ICD-10-CM | POA: Diagnosis not present

## 2018-03-27 DIAGNOSIS — I10 Essential (primary) hypertension: Secondary | ICD-10-CM | POA: Diagnosis not present

## 2018-04-07 DIAGNOSIS — F0390 Unspecified dementia without behavioral disturbance: Secondary | ICD-10-CM | POA: Diagnosis not present

## 2018-04-07 DIAGNOSIS — I739 Peripheral vascular disease, unspecified: Secondary | ICD-10-CM | POA: Diagnosis not present

## 2018-04-07 DIAGNOSIS — B3789 Other sites of candidiasis: Secondary | ICD-10-CM | POA: Diagnosis not present

## 2018-04-07 DIAGNOSIS — E1165 Type 2 diabetes mellitus with hyperglycemia: Secondary | ICD-10-CM | POA: Diagnosis not present

## 2018-04-07 DIAGNOSIS — Z713 Dietary counseling and surveillance: Secondary | ICD-10-CM | POA: Diagnosis not present

## 2018-04-07 DIAGNOSIS — I10 Essential (primary) hypertension: Secondary | ICD-10-CM | POA: Diagnosis not present

## 2018-04-07 DIAGNOSIS — Z6828 Body mass index (BMI) 28.0-28.9, adult: Secondary | ICD-10-CM | POA: Diagnosis not present

## 2018-04-07 DIAGNOSIS — Z299 Encounter for prophylactic measures, unspecified: Secondary | ICD-10-CM | POA: Diagnosis not present

## 2018-05-04 DIAGNOSIS — E119 Type 2 diabetes mellitus without complications: Secondary | ICD-10-CM | POA: Diagnosis not present

## 2018-05-04 DIAGNOSIS — I10 Essential (primary) hypertension: Secondary | ICD-10-CM | POA: Diagnosis not present

## 2018-05-04 DIAGNOSIS — E7800 Pure hypercholesterolemia, unspecified: Secondary | ICD-10-CM | POA: Diagnosis not present

## 2018-05-08 DIAGNOSIS — Z79899 Other long term (current) drug therapy: Secondary | ICD-10-CM | POA: Diagnosis not present

## 2018-05-08 DIAGNOSIS — L409 Psoriasis, unspecified: Secondary | ICD-10-CM | POA: Diagnosis not present

## 2018-05-08 DIAGNOSIS — Z5181 Encounter for therapeutic drug level monitoring: Secondary | ICD-10-CM | POA: Diagnosis not present

## 2018-05-26 DIAGNOSIS — Z6828 Body mass index (BMI) 28.0-28.9, adult: Secondary | ICD-10-CM | POA: Diagnosis not present

## 2018-05-26 DIAGNOSIS — Z299 Encounter for prophylactic measures, unspecified: Secondary | ICD-10-CM | POA: Diagnosis not present

## 2018-05-26 DIAGNOSIS — I10 Essential (primary) hypertension: Secondary | ICD-10-CM | POA: Diagnosis not present

## 2018-05-26 DIAGNOSIS — F0390 Unspecified dementia without behavioral disturbance: Secondary | ICD-10-CM | POA: Diagnosis not present

## 2018-05-26 DIAGNOSIS — B379 Candidiasis, unspecified: Secondary | ICD-10-CM | POA: Diagnosis not present

## 2018-05-30 DIAGNOSIS — E7800 Pure hypercholesterolemia, unspecified: Secondary | ICD-10-CM | POA: Diagnosis not present

## 2018-05-30 DIAGNOSIS — I10 Essential (primary) hypertension: Secondary | ICD-10-CM | POA: Diagnosis not present

## 2018-05-30 DIAGNOSIS — E119 Type 2 diabetes mellitus without complications: Secondary | ICD-10-CM | POA: Diagnosis not present

## 2018-06-05 DIAGNOSIS — B372 Candidiasis of skin and nail: Secondary | ICD-10-CM | POA: Diagnosis not present

## 2018-06-05 DIAGNOSIS — I2510 Atherosclerotic heart disease of native coronary artery without angina pectoris: Secondary | ICD-10-CM | POA: Diagnosis not present

## 2018-06-05 DIAGNOSIS — Z6828 Body mass index (BMI) 28.0-28.9, adult: Secondary | ICD-10-CM | POA: Diagnosis not present

## 2018-06-05 DIAGNOSIS — I739 Peripheral vascular disease, unspecified: Secondary | ICD-10-CM | POA: Diagnosis not present

## 2018-06-05 DIAGNOSIS — E1165 Type 2 diabetes mellitus with hyperglycemia: Secondary | ICD-10-CM | POA: Diagnosis not present

## 2018-06-05 DIAGNOSIS — I10 Essential (primary) hypertension: Secondary | ICD-10-CM | POA: Diagnosis not present

## 2018-06-05 DIAGNOSIS — Z299 Encounter for prophylactic measures, unspecified: Secondary | ICD-10-CM | POA: Diagnosis not present

## 2018-06-07 DIAGNOSIS — L4050 Arthropathic psoriasis, unspecified: Secondary | ICD-10-CM | POA: Diagnosis not present

## 2018-06-07 DIAGNOSIS — L409 Psoriasis, unspecified: Secondary | ICD-10-CM | POA: Diagnosis not present

## 2018-06-07 DIAGNOSIS — L408 Other psoriasis: Secondary | ICD-10-CM | POA: Diagnosis not present

## 2018-06-29 DIAGNOSIS — E7800 Pure hypercholesterolemia, unspecified: Secondary | ICD-10-CM | POA: Diagnosis not present

## 2018-06-29 DIAGNOSIS — I10 Essential (primary) hypertension: Secondary | ICD-10-CM | POA: Diagnosis not present

## 2018-06-29 DIAGNOSIS — E119 Type 2 diabetes mellitus without complications: Secondary | ICD-10-CM | POA: Diagnosis not present

## 2018-07-12 NOTE — Progress Notes (Signed)
NEUROLOGY FOLLOW UP OFFICE NOTE  Lowella GripBrenda E Zayed [id number]  HISTORY OF PRESENT ILLNESS: Jennifer [name] is a 78 year old right-handed woman with hypertension, Takotsubo cardiomyopathy and hyperlipidemia who follows up for Alzheimer's dementia and recurrent episodes of unresponsiveness.  She is accompanied by her brother who supplements history.  UPDATE:  Current medications: Namenda XR 28 mg daily for Alzheimer's disease.  Keppra 500 mg twice daily for possible complex partial seizures.  No changes in last 9 months.  No spells.  Sleepy at times.  She sleeps well.  Appetite is good.  She is able to dress, bathe and use toilet herself.  No agitation.    HISTORY: She began having noticeable memory problems in late 2014 after some medical issues, such as kidney stones, MI and back surgery.  She began having problems remembering how to get to familiar places, such as the dentist or friend's house.  She also reports difficulty remembering names of people she has known for years.  She denies difficulty recognizing faces.  She denies hallucinations, poor sleep or depression.  She has not exhibited episodes of agitation or acute confusion.  Her brother lives with her.  Her son lives next door.  Her brother now handles her bills and finances.  She no longer drives. She performs all of her ADLs and keeps the house tidy.  She exhibits no change in behavior or personality.  In May 2016, she had an episode of unresponsiveness lasting several minutes.  She was with her son when suddenly her head dropped with eyes still open but zoned out.  She was unresponsive and did not talk.  She did not exhibit any twitching or incontinence.  She had an MRI of the brain performed on 11/13/14 which showed age appropriate atrophy and mild chronic small vessel disease.  Carotid doppler performed on 11/25/14 showed less than 50% bilateral ICA stenosis.  She has had two other similar spells only lasting about 30 seconds.  EEG  performed on 01/15/15, which was normal.  She had another spell in on 08/24/16.  [facility].  MRI of brain revealed mild chronic small vessel disease but no acute stroke or mass lesion.  .  Aricept was discontinued because it may lower seizure threshold.  Both parents died in their 660s and had no history of dementia.  PAST MEDICAL HISTORY: Past Medical History:  Diagnosis Date  . Anxiety   . Anxiety   . Coronary atherosclerosis of native coronary artery    Nonobstructive at catheterization June 2014  . DDD (degenerative disc disease), lumbar   . Depression   . Diabetes mellitus (HCC)    Borderline  . Essential hypertension, benign   . Hyperlipidemia   . Nephrolithiasis   . Psoriatic arthritis (HCC)   . Takotsubo cardiomyopathy    LVEF 60-65% September 2014    MEDICATIONS: Current Outpatient Medications on File Prior to Visit  Medication Sig Dispense Refill  . aspirin 81 MG EC tablet Take 1 tablet (81 mg total) by mouth daily. Swallow whole. 30 tablet 12  . atorvastatin (LIPITOR) 10 MG tablet TAKE 1 TABLET AT 6:00 P.M. 30 tablet 3  . Biotin 5000 MCG CAPS Take 10,000 mcg by mouth daily.     . calcium carbonate (OSCAL) 1500 (600 Ca) MG TABS tablet Take 600 mg of elemental calcium by mouth 2 (two) times daily with a meal.    . calcium carbonate (OSCAL) 1500 (600 Ca) MG TABS tablet Take  1,500 mg by mouth daily with breakfast.    . cholecalciferol (VITAMIN D) 1000 units tablet Take 1,000 Units by mouth daily.    . clobetasol cream (TEMOVATE) 0.05 % Apply 1 application topically 2 (two) times daily.    Marland Kitchen. desonide (DESOWEN) 0.05 % ointment Apply 1 application topically as needed.     . fish oil-omega-3 fatty acids 1000 MG capsule Take 2 g by mouth daily.     . Hypromellose (ARTIFICIAL TEARS OP) Place 2 drops into both eyes daily as needed (dry eyes).    . Ixekizumab (TALTZ) 80 MG/ML SOAJ Inject into the skin.    Marland Kitchen. levETIRAcetam (KEPPRA)  500 MG tablet Take 1 tablet (500 mg total) by mouth 2 (two) times daily. 60 tablet 5  . losartan-hydrochlorothiazide (HYZAAR) 50-12.5 MG per tablet Take 1 tablet by mouth daily.    . memantine (NAMENDA XR) 28 MG CP24 24 hr capsule Take 1 capsule (28 mg total) by mouth daily. 30 capsule 5  . metoprolol succinate (TOPROL-XL) 25 MG 24 hr tablet TAKE (1/2) TABLET BY MOUTH DAILY. 15 tablet 6  . Multiple Vitamins-Minerals (CENTRUM SILVER PO) Take 1 tablet by mouth daily.    Marland Kitchen. PARoxetine (PAXIL) 40 MG tablet Take 40 mg by mouth daily.    . tacrolimus (PROTOPIC) 0.1 % ointment as needed.     . triamcinolone lotion (KENALOG) 0.1 % Apply 1 application topically 2 (two) times daily as needed. With Silver Sulfadiazine 1%     No current facility-administered medications on file prior to visit.     ALLERGIES: Allergies  Allergen Reactions  . Cyclobenzaprine     TOO STRONG  CAN'T USE AND WORK    FAMILY HISTORY: Family History  Problem Relation Age of Onset  . CAD Father   . CAD Mother   . Hypertension Mother   . Breast cancer Mother   . Hypertension Brother         X 2  . Cancer Maternal Grandmother        breast cancer    SOCIAL HISTORY: Social History   Socioeconomic History  . Marital status: Married    Spouse name: Not on file  . Number of children: Not on file  . Years of education: Not on file  . Highest education level: Not on file  Occupational History  . Not on file  Social Needs  . Financial resource strain: Not on file  . Food insecurity:    Worry: Not on file    Inability: Not on file  . Transportation needs:    Medical: Not on file    Non-medical: Not on file  Tobacco Use  . Smoking status: Never Smoker  . Smokeless tobacco: Never Used  Substance and Sexual Activity  . Alcohol use: No    Alcohol/week: 0.0 standard drinks  . Drug use: No  . Sexual activity: Not Currently  Lifestyle  . Physical activity:    Days per week: Not on file    Minutes per session:  Not on file  . Stress: Not on file  Relationships  . Social connections:    Talks on phone: Not on file    Gets together: Not on file    Attends religious service: Not on file    Active member of club or organization: Not on file    Attends meetings of clubs or organizations: Not on file    Relationship status: Not on file  . Intimate partner violence:    Fear  of current or ex partner: Not on file    Emotionally abused: Not on file    Physically abused: Not on file    Forced sexual activity: Not on file  Other Topics Concern  . Not on file  Social History Narrative  . Not on file    REVIEW OF SYSTEMS: Constitutional: No fevers, chills, or sweats, no generalized fatigue, change in appetite Eyes: No visual changes, double vision, eye pain Ear, nose and throat: No hearing loss, ear pain, nasal congestion, sore throat Cardiovascular: No chest pain, palpitations Respiratory:  No shortness of breath at rest or with exertion, wheezes GastrointestinaI: No nausea, vomiting, diarrhea, abdominal pain, fecal incontinence Genitourinary:  No dysuria, urinary retention or frequency Musculoskeletal:  No neck pain, back pain Integumentary: No rash, pruritus, skin lesions Neurological: as above Psychiatric: No depression, insomnia, anxiety Endocrine: No palpitations, fatigue, diaphoresis, mood swings, change in appetite, change in weight, increased thirst Hematologic/Lymphatic:  No purpura, petechiae. Allergic/Immunologic: no itchy/runny eyes, nasal congestion, recent allergic reactions, rashes  PHYSICAL EXAM: Blood pressure 112/64, pulse 64, height 5' (1.524 m), weight 137 lb (62.1 kg), SpO2 90 %. General: No acute distress.  Patient appears well-groomed.   Head:  Normocephalic/atraumatic Eyes:  Fundi examined but not visualized Neck: supple, no paraspinal tenderness, full range of motion Heart:  Regular rate and rhythm Lungs:  Clear to auscultation bilaterally Back: No paraspinal  tenderness Neurological Exam: alert and oriented to person and state but not time, county or city. Attention span and concentration poor, recent memory poor, remote memory intact, fund of knowledge reduced.  Speech fluent and not dysarthric, language intact.  Left irregular nonreactive surgical pupil.  Otherwise, CN II-XII intact. Bulk and tone normal, muscle strength 5/5 throughout.  Sensation to light touch  intact.  Deep tendon reflexes 2+ throughout, toes downgoing.  Finger to nose and heel to shin testing intact.  Gait normal, Romberg negative.  IMPRESSION: Alzheimer's disease Recurrent transient spells, cannot rule out possible complex partial seizures  PLAN: 1.  Namenda XR 28 mg daily 2.  Keppra 500 mg twice daily 3.  Follow up in 9 months.  17 minutes spent face to face with patient, over 50% spent discussing management.  Shon Millet, DO  CC: Kirstie Peri, MD

## 2018-07-13 ENCOUNTER — Ambulatory Visit (INDEPENDENT_AMBULATORY_CARE_PROVIDER_SITE_OTHER): Payer: Medicare Other | Admitting: Neurology

## 2018-07-13 ENCOUNTER — Ambulatory Visit: Payer: Medicare Other | Admitting: Neurology

## 2018-07-13 ENCOUNTER — Encounter: Payer: Self-pay | Admitting: Neurology

## 2018-07-13 VITALS — BP 112/64 | HR 64 | Ht 60.0 in | Wt 137.0 lb

## 2018-07-13 DIAGNOSIS — F0280 Dementia in other diseases classified elsewhere without behavioral disturbance: Secondary | ICD-10-CM

## 2018-07-13 DIAGNOSIS — R6889 Other general symptoms and signs: Secondary | ICD-10-CM | POA: Diagnosis not present

## 2018-07-13 DIAGNOSIS — G301 Alzheimer's disease with late onset: Secondary | ICD-10-CM | POA: Diagnosis not present

## 2018-07-13 NOTE — Patient Instructions (Signed)
1.  Continue Namenda XR 28mg  daily and levetiracetam 500mg  twice daily 2.  Continue going out and socializing 3.  Stay hydrated. 4.  Follow up in 9 months.

## 2018-07-14 DIAGNOSIS — Z6828 Body mass index (BMI) 28.0-28.9, adult: Secondary | ICD-10-CM | POA: Diagnosis not present

## 2018-07-14 DIAGNOSIS — L4050 Arthropathic psoriasis, unspecified: Secondary | ICD-10-CM | POA: Diagnosis not present

## 2018-07-14 DIAGNOSIS — Z299 Encounter for prophylactic measures, unspecified: Secondary | ICD-10-CM | POA: Diagnosis not present

## 2018-07-14 DIAGNOSIS — E1165 Type 2 diabetes mellitus with hyperglycemia: Secondary | ICD-10-CM | POA: Diagnosis not present

## 2018-07-14 DIAGNOSIS — F0390 Unspecified dementia without behavioral disturbance: Secondary | ICD-10-CM | POA: Diagnosis not present

## 2018-07-14 DIAGNOSIS — Z789 Other specified health status: Secondary | ICD-10-CM | POA: Diagnosis not present

## 2018-07-14 DIAGNOSIS — I10 Essential (primary) hypertension: Secondary | ICD-10-CM | POA: Diagnosis not present

## 2018-07-14 DIAGNOSIS — R569 Unspecified convulsions: Secondary | ICD-10-CM | POA: Diagnosis not present

## 2018-07-19 DIAGNOSIS — E11319 Type 2 diabetes mellitus with unspecified diabetic retinopathy without macular edema: Secondary | ICD-10-CM | POA: Diagnosis not present

## 2018-08-07 DIAGNOSIS — E7800 Pure hypercholesterolemia, unspecified: Secondary | ICD-10-CM | POA: Diagnosis not present

## 2018-08-07 DIAGNOSIS — I10 Essential (primary) hypertension: Secondary | ICD-10-CM | POA: Diagnosis not present

## 2018-08-07 DIAGNOSIS — E119 Type 2 diabetes mellitus without complications: Secondary | ICD-10-CM | POA: Diagnosis not present

## 2018-08-09 NOTE — Progress Notes (Signed)
Cardiology Office Note  Date: 08/10/2018   ID: Nhyla, [place] Jul 17, 1940, MRN [id number]  PCP: Kirstie Peri, MD  Primary Cardiologist: Nona Dell, MD   Chief Complaint  Patient presents with  . Cardiomyopathy    History of Present Illness: Jennifer [name] is a 78 y.o. female last seen in August 2019.  She is here today with her brother for a routine follow-up visit.  Overall no reported changes in stamina, no exertional chest pain, palpitations, or syncope.  We went over her medications which are stable from a cardiac perspective.  Blood pressure was elevated today, but this has not generally been the case.  I personally reviewed her ECG today which shows sinus bradycardia with LVH and repolarization changes, poor R wave progression.  Past Medical History:  Diagnosis Date  . Anxiety   . Anxiety   . Coronary atherosclerosis of native coronary artery    Nonobstructive at catheterization June 2014  . DDD (degenerative disc disease), lumbar   . Depression   . Diabetes mellitus (HCC)    Borderline  . Essential hypertension, benign   . Hyperlipidemia   . Nephrolithiasis   . Psoriatic arthritis (HCC)   . Takotsubo cardiomyopathy    LVEF 60-65% September 2014    Past Surgical History:  Procedure Laterality Date  . APPENDECTOMY    . CHOLECYSTECTOMY    . LEFT HEART CATHETERIZATION WITH CORONARY ANGIOGRAM N/A 12/18/2012   Procedure: LEFT HEART CATHETERIZATION WITH CORONARY ANGIOGRAM;  Surgeon: Tonny Bollman, MD;  Location: [facility] C[facility] LAB;  Service: Cardiovascular;  Laterality: N/A;    Current Outpatient Medications  Medication Sig Dispense Refill  . aspirin 81 MG EC tablet Take 1 tablet (81 mg total) by mouth daily. Swallow whole. 30 tablet 12  . atorvastatin (LIPITOR) 10 MG tablet TAKE 1 TABLET AT 6:00 P.M. 30 tablet 3  . Biotin 5000 MCG CAPS Take 10,000 mcg by mouth daily.     . calcium carbonate (OSCAL) 1500 (600 Ca) MG TABS tablet Take 600 mg of elemental  calcium by mouth 2 (two) times daily with a meal.    . clobetasol cream (TEMOVATE) 0.05 % Apply 1 application topically 2 (two) times daily.    Marland Kitchen desonide (DESOWEN) 0.05 % ointment Apply 1 application topically as needed.     . fish oil-omega-3 fatty acids 1000 MG capsule Take 2 g by mouth daily.     . fluocinonide cream (LIDEX) 0.05 % Apply 1 application topically 2 (two) times daily.    . hydroxypropyl methylcellulose / hypromellose (ISOPTO TEARS / GONIOVISC) 2.5 % ophthalmic solution 1 drop.    . Ixekizumab (TALTZ) 80 MG/ML SOSY INJECT ONE SYRINGE SUBCUTANEOUSLY EVERY 4 WEEKS. REFRIGERATE. ALLOW PEN TO REACH ROOM TEMP PRIOR TO INJECTION.    Marland Kitchen levETIRAcetam (KEPPRA) 500 MG tablet Take 1 tablet (500 mg total) by mouth 2 (two) times daily. 60 tablet 5  . losartan-hydrochlorothiazide (HYZAAR) 50-12.5 MG per tablet Take 1 tablet by mouth daily.    . memantine (NAMENDA XR) 28 MG CP24 24 hr capsule Take 1 capsule (28 mg total) by mouth daily. 30 capsule 5  . metoprolol succinate (TOPROL-XL) 25 MG 24 hr tablet TAKE (1/2) TABLET BY MOUTH DAILY. 15 tablet 6  . Multiple Vitamins-Minerals (CENTRUM SILVER PO) Take 1 tablet by mouth daily.    Marland Kitchen PARoxetine (PAXIL) 40 MG tablet Take 40 mg by mouth daily.    . tacrolimus (PROTOPIC) 0.1 % ointment as needed.     Marland Kitchen  triamcinolone lotion (KENALOG) 0.1 % Apply 1 application topically 2 (two) times daily as needed. With Silver Sulfadiazine 1%     No current facility-administered medications for this visit.    Allergies:  Cyclobenzaprine   Social History: The patient  reports that she has never smoked. She has never used smokeless tobacco. She reports that she does not drink alcohol or use drugs.  ROS:  Please see the history of present illness. Otherwise, complete review of systems is positive for memory loss, arthritic stiffness.  All other systems are reviewed and negative.   Physical Exam: VS:  BP (!) 156/71   Pulse (!) 53   Ht 5' (1.524 m)   Wt 137 lb  (62.1 kg)   SpO2 93%   BMI 26.76 kg/m , BMI Body mass index is 26.76 kg/m.  Wt Readings from Last 3 Encounters:  08/10/18 137 lb (62.1 kg)  07/13/18 137 lb (62.1 kg)  02/03/18 142 lb (64.4 kg)    General: Elderly woman, appears comfortable at rest. HEENT: Conjunctiva and lids normal, oropharynx clear. Neck: Supple, no elevated JVP or carotid bruits, no thyromegaly. Lungs: Clear to auscultation, nonlabored breathing at rest. Cardiac: Regular rate and rhythm, no S3 or significant systolic murmur, no pericardial rub. Abdomen: Soft, nontender, bowel sounds present. Extremities: No pitting edema, distal pulses 2+. Skin: Warm and dry. Musculoskeletal: No kyphosis. Neuropsychiatric: Alert and oriented x3, affect grossly appropriate.  ECG: I personally reviewed the tracing from 08/03/2017 which showed sinus bradycardia with left axis and increased voltage.  Recent Labwork:  March 2018: BUN 20, creatinine 0.56, AST 26, ALT 22, potassium 3.7, hemoglobin 14.7, platelets 192  Other Studies Reviewed Today:  Echocardiogram9/03/2013: Study Conclusions  - Left ventricle: The cavity size was normal. Wall thickness was normal. Systolic function was normal. The estimated ejection fraction was in the range of 60% to 65%. Wall motion was normal; there were no regional wall motion abnormalities. Doppler parameters are consistent with abnormal left ventricular relaxation (grade 1 diastolic dysfunction). - Right atrium: Central venous pressure: 68mm Hg (est). - Tricuspid valve: Physiologic regurgitation. - Pulmonary arteries: PA peak pressure: 37mm Hg (S). - Pericardium, extracardiac: There was no pericardial effusion. Impressions:  - Comparison to previous study June 2014. There has been normalization of LV systolic function, LVEF now 60-65%. Grade 1 diastolic dysfunction. Normal PASP, no pericardial effusion.  Assessment and Plan:  1.  History of stress-induced  cardiomyopathy with normalization of LVEF.  No major change in stamina, no exertional chest pain or increasing breathlessness.  Continue with medical therapy and observation.  2.  Essential hypertension, blood pressure is up today, but this is typically not the case.  No changes made in present regimen.  Continue observation and follow-up with Dr. Sherryll Burger.  3.  Alzheimer's dementia.  4.  Mixed hyperlipidemia on Lipitor.  Keep follow-up with Dr. Sherryll Burger.  Current medicines were reviewed with the patient today.   Orders Placed This Encounter  Procedures  . EKG 12-Lead    Disposition: Follow-up in 6 months.  Signed, Jonelle Sidle, MD, [facility] 08/10/2018 11:34 AM    [facility] [address] [place], [place], [place] [address] Phone: [phone]; Fax: [phone]

## 2018-08-10 ENCOUNTER — Ambulatory Visit (INDEPENDENT_AMBULATORY_CARE_PROVIDER_SITE_OTHER): Payer: Medicare Other | Admitting: Cardiology

## 2018-08-10 ENCOUNTER — Encounter: Payer: Self-pay | Admitting: Cardiology

## 2018-08-10 VITALS — BP 156/71 | HR 53 | Ht 60.0 in | Wt 137.0 lb

## 2018-08-10 DIAGNOSIS — E782 Mixed hyperlipidemia: Secondary | ICD-10-CM

## 2018-08-10 DIAGNOSIS — F0280 Dementia in other diseases classified elsewhere without behavioral disturbance: Secondary | ICD-10-CM | POA: Diagnosis not present

## 2018-08-10 DIAGNOSIS — I10 Essential (primary) hypertension: Secondary | ICD-10-CM | POA: Diagnosis not present

## 2018-08-10 DIAGNOSIS — G309 Alzheimer's disease, unspecified: Secondary | ICD-10-CM | POA: Diagnosis not present

## 2018-08-10 DIAGNOSIS — Z8679 Personal history of other diseases of the circulatory system: Secondary | ICD-10-CM

## 2018-08-10 NOTE — Patient Instructions (Addendum)

## 2018-09-21 DIAGNOSIS — E7800 Pure hypercholesterolemia, unspecified: Secondary | ICD-10-CM | POA: Diagnosis not present

## 2018-09-21 DIAGNOSIS — E119 Type 2 diabetes mellitus without complications: Secondary | ICD-10-CM | POA: Diagnosis not present

## 2018-09-21 DIAGNOSIS — I10 Essential (primary) hypertension: Secondary | ICD-10-CM | POA: Diagnosis not present

## 2018-10-20 DIAGNOSIS — I2510 Atherosclerotic heart disease of native coronary artery without angina pectoris: Secondary | ICD-10-CM | POA: Diagnosis not present

## 2018-10-20 DIAGNOSIS — E1165 Type 2 diabetes mellitus with hyperglycemia: Secondary | ICD-10-CM | POA: Diagnosis not present

## 2018-10-20 DIAGNOSIS — F0390 Unspecified dementia without behavioral disturbance: Secondary | ICD-10-CM | POA: Diagnosis not present

## 2018-10-20 DIAGNOSIS — Z299 Encounter for prophylactic measures, unspecified: Secondary | ICD-10-CM | POA: Diagnosis not present

## 2018-10-20 DIAGNOSIS — Z6827 Body mass index (BMI) 27.0-27.9, adult: Secondary | ICD-10-CM | POA: Diagnosis not present

## 2018-10-20 DIAGNOSIS — I10 Essential (primary) hypertension: Secondary | ICD-10-CM | POA: Diagnosis not present

## 2018-10-20 DIAGNOSIS — R569 Unspecified convulsions: Secondary | ICD-10-CM | POA: Diagnosis not present

## 2018-10-27 DIAGNOSIS — E7800 Pure hypercholesterolemia, unspecified: Secondary | ICD-10-CM | POA: Diagnosis not present

## 2018-10-27 DIAGNOSIS — I10 Essential (primary) hypertension: Secondary | ICD-10-CM | POA: Diagnosis not present

## 2018-10-27 DIAGNOSIS — E119 Type 2 diabetes mellitus without complications: Secondary | ICD-10-CM | POA: Diagnosis not present

## 2018-11-24 DIAGNOSIS — E7800 Pure hypercholesterolemia, unspecified: Secondary | ICD-10-CM | POA: Diagnosis not present

## 2018-11-24 DIAGNOSIS — E119 Type 2 diabetes mellitus without complications: Secondary | ICD-10-CM | POA: Diagnosis not present

## 2018-11-24 DIAGNOSIS — I10 Essential (primary) hypertension: Secondary | ICD-10-CM | POA: Diagnosis not present

## 2018-11-30 DIAGNOSIS — Z1211 Encounter for screening for malignant neoplasm of colon: Secondary | ICD-10-CM | POA: Diagnosis not present

## 2018-11-30 DIAGNOSIS — E559 Vitamin D deficiency, unspecified: Secondary | ICD-10-CM | POA: Diagnosis not present

## 2018-11-30 DIAGNOSIS — Z79899 Other long term (current) drug therapy: Secondary | ICD-10-CM | POA: Diagnosis not present

## 2018-11-30 DIAGNOSIS — Z1331 Encounter for screening for depression: Secondary | ICD-10-CM | POA: Diagnosis not present

## 2018-11-30 DIAGNOSIS — Z7189 Other specified counseling: Secondary | ICD-10-CM | POA: Diagnosis not present

## 2018-11-30 DIAGNOSIS — Z299 Encounter for prophylactic measures, unspecified: Secondary | ICD-10-CM | POA: Diagnosis not present

## 2018-11-30 DIAGNOSIS — E7800 Pure hypercholesterolemia, unspecified: Secondary | ICD-10-CM | POA: Diagnosis not present

## 2018-11-30 DIAGNOSIS — F419 Anxiety disorder, unspecified: Secondary | ICD-10-CM | POA: Diagnosis not present

## 2018-11-30 DIAGNOSIS — I10 Essential (primary) hypertension: Secondary | ICD-10-CM | POA: Diagnosis not present

## 2018-11-30 DIAGNOSIS — Z6826 Body mass index (BMI) 26.0-26.9, adult: Secondary | ICD-10-CM | POA: Diagnosis not present

## 2018-11-30 DIAGNOSIS — Z0000 Encounter for general adult medical examination without abnormal findings: Secondary | ICD-10-CM | POA: Diagnosis not present

## 2018-11-30 DIAGNOSIS — Z1339 Encounter for screening examination for other mental health and behavioral disorders: Secondary | ICD-10-CM | POA: Diagnosis not present

## 2018-12-08 DIAGNOSIS — E2839 Other primary ovarian failure: Secondary | ICD-10-CM | POA: Diagnosis not present

## 2018-12-21 DIAGNOSIS — I10 Essential (primary) hypertension: Secondary | ICD-10-CM | POA: Diagnosis not present

## 2018-12-21 DIAGNOSIS — E7800 Pure hypercholesterolemia, unspecified: Secondary | ICD-10-CM | POA: Diagnosis not present

## 2018-12-21 DIAGNOSIS — E119 Type 2 diabetes mellitus without complications: Secondary | ICD-10-CM | POA: Diagnosis not present

## 2019-01-24 DIAGNOSIS — E7800 Pure hypercholesterolemia, unspecified: Secondary | ICD-10-CM | POA: Diagnosis not present

## 2019-01-24 DIAGNOSIS — I10 Essential (primary) hypertension: Secondary | ICD-10-CM | POA: Diagnosis not present

## 2019-01-24 DIAGNOSIS — E119 Type 2 diabetes mellitus without complications: Secondary | ICD-10-CM | POA: Diagnosis not present

## 2019-01-31 ENCOUNTER — Telehealth: Payer: Self-pay | Admitting: Cardiology

## 2019-01-31 NOTE — Telephone Encounter (Signed)
Virtual Visit Pre-Appointment Phone Call  "(Name), I am calling you today to discuss your upcoming appointment. We are currently trying to limit exposure to the virus that causes COVID-19 by seeing patients at home rather than in the office."  1. "What is the BEST phone number to call the day of the visit?" - include this in appointment notes  2. Do you have or have access to (through a family member/friend) a smartphone with video capability that we can use for your visit?" a. If yes - list this number in appt notes as cell (if different from BEST phone #) and list the appointment type as a VIDEO visit in appointment notes b. If no - list the appointment type as a PHONE visit in appointment notes  3. Confirm consent - "In the setting of the current Covid19 crisis, you are scheduled for a (phone or video) visit with your provider on (date) at (time).  Just as we do with many in-office visits, in order for you to participate in this visit, we must obtain consent.  If you'd like, I can send this to your mychart (if signed up) or email for you to review.  Otherwise, I can obtain your verbal consent now.  All virtual visits are billed to your insurance company just like a normal visit would be.  By agreeing to a virtual visit, we'd like you to understand that the technology does not allow for your provider to perform an examination, and thus may limit your provider's ability to fully assess your condition. If your provider identifies any concerns that need to be evaluated in person, we will make arrangements to do so.  Finally, though the technology is pretty good, we cannot assure that it will always work on either your or our end, and in the setting of a video visit, we may have to convert it to a phone-only visit.  In either situation, we cannot ensure that we have a secure connection.  Are you willing to proceed?" STAFF: Did the patient verbally acknowledge consent to telehealth visit? Document  YES/NO here: yes  4. Advise patient to be prepared - "Two hours prior to your appointment, go ahead and check your blood pressure, pulse, oxygen saturation, and your weight (if you have the equipment to check those) and write them all down. When your visit starts, your provider will ask you for this information. If you have an Apple Watch or Kardia device, please plan to have heart rate information ready on the day of your appointment. Please have a pen and paper handy nearby the day of the visit as well."  5. Give patient instructions for MyChart download to smartphone OR [email or web address] as below if video visit (depending on what platform provider is using)  6. Inform patient they will receive a phone call 15 minutes prior to their appointment time (may be from unknown caller ID) so they should be prepared to answer    TELEPHONE CALL NOTE  Jennifer GripBrenda E Hedstrom has been deemed a candidate for a follow-up tele-health visit to limit community exposure during the Covid-19 pandemic. I spoke with the patient via phone to ensure availability of phone/video source, confirm preferred email & phone number, and discuss instructions and expectations.  I reminded Jennifer [name] to be prepared with any vital sign and/or heart rhythm information that could potentially be obtained via home monitoring, at the time of her visit. I reminded Jennifer GripBrenda E Razon to expect a phone call prior to  her visit.  Weston Anna 01/31/2019 10:13 AM   INSTRUCTIONS FOR DOWNLOADING THE MYCHART APP TO SMARTPHONE  - The patient must first make sure to have activated MyChart and know their login information - If Apple, go to [employer] and type in MyChart in the search bar and download the app. If Android, ask patient to go to [employer] and type in [place] in the search bar and download the app. The app is free but as with any other app downloads, their phone may require them to verify saved payment information or  Apple/Android password.  - The patient will need to then log into the app with their MyChart username and password, and select [facility] as their healthcare provider to link the account. When it is time for your visit, go to the MyChart app, find appointments, and click Begin Video Visit. Be sure to Select Allow for your device to access the Microphone and Camera for your visit. You will then be connected, and your provider will be with you shortly.  **If they have any issues connecting, or need assistance please contact MyChart service desk [phone])**  **If using a computer, in order to ensure the best quality for their visit they will need to use either of the following Internet Browsers: [employer], or Google Chrome**  IF USING DOXIMITY or DOXY.ME - The patient will receive a link just prior to their visit by text.     FULL LENGTH CONSENT FOR TELE-HEALTH VISIT   I hereby voluntarily request, consent and authorize [facility] and its employed or contracted physicians, physician assistants, nurse practitioners or other licensed health care professionals (the Practitioner), to provide me with telemedicine health care services (the Services") as deemed necessary by the treating Practitioner. I acknowledge and consent to receive the Services by the Practitioner via telemedicine. I understand that the telemedicine visit will involve communicating with the Practitioner through live audiovisual communication technology and the disclosure of certain medical information by electronic transmission. I acknowledge that I have been given the opportunity to request an in-person assessment or other available alternative prior to the telemedicine visit and am voluntarily participating in the telemedicine visit.  I understand that I have the right to withhold or withdraw my consent to the use of telemedicine in the course of my care at any time, without affecting my right to future care  or treatment, and that the Practitioner or I may terminate the telemedicine visit at any time. I understand that I have the right to inspect all information obtained and/or recorded in the course of the telemedicine visit and may receive copies of available information for a reasonable fee.  I understand that some of the potential risks of receiving the Services via telemedicine include:   Delay or interruption in medical evaluation due to technological equipment failure or disruption;  Information transmitted may not be sufficient (e.g. poor resolution of images) to allow for appropriate medical decision making by the Practitioner; and/or   In rare instances, security protocols could fail, causing a breach of personal health information.  Furthermore, I acknowledge that it is my responsibility to provide information about my medical history, conditions and care that is complete and accurate to the best of my ability. I acknowledge that Practitioner's advice, recommendations, and/or decision may be based on factors not within their control, such as incomplete or inaccurate data provided by me or distortions of diagnostic images or specimens that may result from electronic transmissions. I  understand that the practice of medicine is not an exact science and that Practitioner makes no warranties or guarantees regarding treatment outcomes. I acknowledge that I will receive a copy of this consent concurrently upon execution via email to the email address I last provided but may also request a printed copy by calling the office of [facility].    I understand that my insurance will be billed for this visit.   I have read or had this consent read to me.  I understand the contents of this consent, which adequately explains the benefits and risks of the Services being provided via telemedicine.   I have been provided ample opportunity to ask questions regarding this consent and the Services and have had  my questions answered to my satisfaction.  I give my informed consent for the services to be provided through the use of telemedicine in my medical care  By participating in this telemedicine visit I agree to the above.

## 2019-02-07 ENCOUNTER — Telehealth (INDEPENDENT_AMBULATORY_CARE_PROVIDER_SITE_OTHER): Payer: Medicare Other | Admitting: Cardiology

## 2019-02-07 ENCOUNTER — Encounter: Payer: Self-pay | Admitting: Cardiology

## 2019-02-07 VITALS — BP 103/62 | HR 54 | Ht 60.0 in | Wt 131.0 lb

## 2019-02-07 DIAGNOSIS — G309 Alzheimer's disease, unspecified: Secondary | ICD-10-CM

## 2019-02-07 DIAGNOSIS — I10 Essential (primary) hypertension: Secondary | ICD-10-CM | POA: Diagnosis not present

## 2019-02-07 DIAGNOSIS — F0280 Dementia in other diseases classified elsewhere without behavioral disturbance: Secondary | ICD-10-CM | POA: Diagnosis not present

## 2019-02-07 DIAGNOSIS — Z8679 Personal history of other diseases of the circulatory system: Secondary | ICD-10-CM | POA: Diagnosis not present

## 2019-02-07 DIAGNOSIS — E782 Mixed hyperlipidemia: Secondary | ICD-10-CM

## 2019-02-07 NOTE — Patient Instructions (Addendum)

## 2019-02-07 NOTE — Progress Notes (Signed)
Virtual Visit via Telephone Note   This visit type was conducted due to national recommendations for restrictions regarding the COVID-19 Pandemic (e.g. social distancing) in an effort to limit this patient's exposure and mitigate transmission in our community.  Due to her co-morbid illnesses, this patient is at least at moderate risk for complications without adequate follow up.  This format is felt to be most appropriate for this patient at this time.  The patient did not have access to video technology/had technical difficulties with video requiring transitioning to audio format only (telephone).  All issues noted in this document were discussed and addressed.  No physical exam could be performed with this format.  Please refer to the patient's chart for her  consent to telehealth for [facility].   Date:  02/07/2019   ID:  Jennifer GripBrenda E Pallett, DOB May 22, 1941, MRN [id number]  Patient Location: Home Provider Location: Office  PCP:  Kirstie PeriShah, Ashish, MD  Cardiologist:  Nona DellSamuel Xena Propst, MD Electrophysiologist:  None   Evaluation Performed:  Follow-Up Visit  Chief Complaint:   Cardiac follow-up  History of Present Illness:    Jennifer [name] is a 78 y.o. female last seen in February.  Video access and we spoke by phone today.  I spoke with her brother who is her primary caregiver.  She continues to have declining dementia, still at home with good support.  She did not voice any specific complaints today, and her brother states that she has been doing reasonably well.  She has been social distancing, really only goes out of the house when her brother takes her for a ride in the car, he does all the shopping and takes care of other necessities.  I reviewed her medications which are outlined below and stable from a cardiac perspective.  She otherwise continues to follow with Dr. Sherryll BurgerShah.  The patient does not have symptoms concerning for COVID-19 infection (fever, chills, cough, or new shortness of  breath).    Past Medical History:  Diagnosis Date  . Anxiety   . Anxiety   . Coronary atherosclerosis of native coronary artery    Nonobstructive at catheterization June 2014  . DDD (degenerative disc disease), lumbar   . Depression   . Diabetes mellitus (HCC)    Borderline  . Essential hypertension, benign   . Hyperlipidemia   . Nephrolithiasis   . Psoriatic arthritis (HCC)   . Takotsubo cardiomyopathy    LVEF 60-65% September 2014   Past Surgical History:  Procedure Laterality Date  . APPENDECTOMY    . CHOLECYSTECTOMY    . LEFT HEART CATHETERIZATION WITH CORONARY ANGIOGRAM N/A 12/18/2012   Procedure: LEFT HEART CATHETERIZATION WITH CORONARY ANGIOGRAM;  Surgeon: Tonny BollmanMichael Cooper, MD;  [facility];  Service: Cardiovascular;  Laterality: N/A;     Current Meds  Medication Sig  . aspirin 81 MG EC tablet Take 1 tablet (81 mg total) by mouth daily. Swallow whole.  Marland Kitchen. atorvastatin (LIPITOR) 10 MG tablet TAKE 1 TABLET AT 6:00 P.M.  . Biotin 5000 MCG CAPS Take 10,000 mcg by mouth daily.   . busPIRone (BUSPAR) 5 MG tablet Take 1 tablet by mouth 2 (two) times daily.   . calcium carbonate (OSCAL) 1500 (600 Ca) MG TABS tablet Take 600 mg of elemental calcium by mouth 2 (two) times daily with a meal.  . clobetasol cream (TEMOVATE) 0.05 % Apply 1 application topically 2 (two) times daily as needed.   . clonazePAM (KLONOPIN) 0.5 MG tablet Take 1 tablet  by mouth daily as needed.  . desonide (DESOWEN) 0.05 % ointment Apply 1 application topically as needed.   . fish oil-omega-3 fatty acids 1000 MG capsule Take 2 g by mouth daily.   . fluocinonide cream (LIDEX) 0.05 % Apply 1 application topically 2 (two) times daily.  . hydroxypropyl methylcellulose / hypromellose (ISOPTO TEARS / GONIOVISC) 2.5 % ophthalmic solution 1 drop.  . Ixekizumab (TALTZ) 80 MG/ML SOSY INJECT ONE SYRINGE SUBCUTANEOUSLY EVERY 4 WEEKS. REFRIGERATE. ALLOW PEN TO REACH ROOM TEMP PRIOR TO INJECTION.  Marland Kitchen. levETIRAcetam  (KEPPRA) 500 MG tablet Take 1 tablet (500 mg total) by mouth 2 (two) times daily.  Marland Kitchen. losartan-hydrochlorothiazide (HYZAAR) 50-12.5 MG per tablet Take 1 tablet by mouth daily.  . memantine (NAMENDA XR) 28 MG CP24 24 hr capsule Take 1 capsule (28 mg total) by mouth daily.  . metoprolol succinate (TOPROL-XL) 25 MG 24 hr tablet TAKE (1/2) TABLET BY MOUTH DAILY.  . Multiple Vitamins-Minerals (CENTRUM SILVER PO) Take 1 tablet by mouth daily.  Marland Kitchen. PARoxetine (PAXIL) 40 MG tablet Take 40 mg by mouth daily.  . tacrolimus (PROTOPIC) 0.1 % ointment as needed.   . triamcinolone lotion (KENALOG) 0.1 % Apply 1 application topically 2 (two) times daily as needed. With Silver Sulfadiazine 1%     Allergies:   Cyclobenzaprine   Social History   Tobacco Use  . Smoking status: Never Smoker  . Smokeless tobacco: Never Used  Substance Use Topics  . Alcohol use: No    Alcohol/week: 0.0 standard drinks  . Drug use: No     Family Hx: The patient's family history includes Breast cancer in her mother; CAD in her father and mother; Cancer in her maternal grandmother; Hypertension in her brother and mother.  ROS:   Please see the history of present illness. All other systems reviewed and are negative.   Prior CV studies:   The following studies were reviewed today:  Echocardiogram9/03/2013: Study Conclusions  - Left ventricle: The cavity size was normal. Wall thickness was normal. Systolic function was normal. The estimated ejection fraction was in the range of 60% to 65%. Wall motion was normal; there were no regional wall motion abnormalities. Doppler parameters are consistent with abnormal left ventricular relaxation (grade 1 diastolic dysfunction). - Right atrium: Central venous pressure: 3mm Hg (est). - Tricuspid valve: Physiologic regurgitation. - Pulmonary arteries: PA peak pressure: 25mm Hg (S). - Pericardium, extracardiac: There was no pericardial effusion. Impressions:  -  Comparison to previous study June 2014. There has been normalization of LV systolic function, LVEF now 60-65%. Grade 1 diastolic dysfunction. Normal PASP, no pericardial effusion.  Labs/Other Tests and Data Reviewed:    EKG:  An ECG dated 08/10/2018 was personally reviewed today and demonstrated:  Sinus bradycardia with LVH and repolarization changes, poor R wave progression.  Recent Labs: No results found for requested labs within last 8760 hours.   Recent Lipid Panel Lab Results  Component Value Date/Time   CHOL 152 12/16/2012 05:20 AM   TRIG 34 12/16/2012 05:20 AM   HDL 57 12/16/2012 05:20 AM   CHOLHDL 2.7 12/16/2012 05:20 AM   LDLCALC 88 12/16/2012 05:20 AM    Wt Readings from Last 3 Encounters:  02/07/19 131 lb (59.4 kg)  08/10/18 137 lb (62.1 kg)  07/13/18 137 lb (62.1 kg)     Objective:    Vital Signs:  BP 103/62   Pulse (!) 54   Ht 5' (1.524 m)   Wt 131 lb (59.4 kg)  BMI 25.58 kg/m    Patient spoke in short sentences, no obvious shortness of breath. No audible wheezing or coughing.  ASSESSMENT & PLAN:    1.  Stress-induced cardiomyopathy with normalization of LVEF on medical therapy.  Patient has been overall stable without interval development of shortness of breath, orthopnea or PND, leg swelling.  Continue observation at this point.  Last imaging study was in 2014.  2.  Essential hypertension, blood pressure is normal today.  3.  Progressive dementia.  Patient's brother continues to serve as primary caregiver.  COVID-19 Education: The signs and symptoms of COVID-19 were discussed with the patient and how to seek care for testing (follow up with PCP or arrange E-visit).  The importance of social distancing was discussed today.  Time:   Today, I have spent 5 minutes with the patient with telehealth technology discussing the above problems.     Medication Adjustments/Labs and Tests Ordered: Current medicines are reviewed at length with the patient  today.  Concerns regarding medicines are outlined above.   Tests Ordered: No orders of the defined types were placed in this encounter.   Medication Changes: No orders of the defined types were placed in this encounter.   Follow Up:  Virtual Visit or In Person 6 months in [place].  Signed, Rozann Lesches, MD  02/07/2019 1:59 PM    [place]

## 2019-02-16 DIAGNOSIS — I10 Essential (primary) hypertension: Secondary | ICD-10-CM | POA: Diagnosis not present

## 2019-02-16 DIAGNOSIS — Z299 Encounter for prophylactic measures, unspecified: Secondary | ICD-10-CM | POA: Diagnosis not present

## 2019-02-16 DIAGNOSIS — K921 Melena: Secondary | ICD-10-CM | POA: Diagnosis not present

## 2019-02-16 DIAGNOSIS — Z6826 Body mass index (BMI) 26.0-26.9, adult: Secondary | ICD-10-CM | POA: Diagnosis not present

## 2019-02-16 DIAGNOSIS — I739 Peripheral vascular disease, unspecified: Secondary | ICD-10-CM | POA: Diagnosis not present

## 2019-02-16 DIAGNOSIS — F0390 Unspecified dementia without behavioral disturbance: Secondary | ICD-10-CM | POA: Diagnosis not present

## 2019-02-20 DIAGNOSIS — I10 Essential (primary) hypertension: Secondary | ICD-10-CM | POA: Diagnosis not present

## 2019-02-20 DIAGNOSIS — E119 Type 2 diabetes mellitus without complications: Secondary | ICD-10-CM | POA: Diagnosis not present

## 2019-02-20 DIAGNOSIS — E7800 Pure hypercholesterolemia, unspecified: Secondary | ICD-10-CM | POA: Diagnosis not present

## 2019-03-01 ENCOUNTER — Encounter: Payer: Self-pay | Admitting: Internal Medicine

## 2019-03-02 DIAGNOSIS — I10 Essential (primary) hypertension: Secondary | ICD-10-CM | POA: Diagnosis not present

## 2019-03-02 DIAGNOSIS — Z299 Encounter for prophylactic measures, unspecified: Secondary | ICD-10-CM | POA: Diagnosis not present

## 2019-03-02 DIAGNOSIS — K921 Melena: Secondary | ICD-10-CM | POA: Diagnosis not present

## 2019-03-02 DIAGNOSIS — M47819 Spondylosis without myelopathy or radiculopathy, site unspecified: Secondary | ICD-10-CM | POA: Diagnosis not present

## 2019-03-02 DIAGNOSIS — Z6826 Body mass index (BMI) 26.0-26.9, adult: Secondary | ICD-10-CM | POA: Diagnosis not present

## 2019-03-02 DIAGNOSIS — E1165 Type 2 diabetes mellitus with hyperglycemia: Secondary | ICD-10-CM | POA: Diagnosis not present

## 2019-03-02 DIAGNOSIS — F0390 Unspecified dementia without behavioral disturbance: Secondary | ICD-10-CM | POA: Diagnosis not present

## 2019-03-13 NOTE — Progress Notes (Signed)
Referring Provider: Ignatius Specking, MD Primary Care Physician:  Ignatius Specking, MD Primary Gastroenterologist:  Dr. Jena Gauss  Chief Complaint  Patient presents with  . Rectal Bleeding    last tcs approx 9-10 yrs ago    HPI:   Jennifer [name] is a 78 y.o. female presenting today at the request of Dr. Sherril Croon for rectal bleeding.    Today she presents with her brother who is staying with her at this time due to worsening dementia. Brother reports bright red blood on toilet tissue and possibly in the stool. Started within the last month. Occurred 8-10 times. No abdominal pain. Not sure if she has hemorrhoids. Last TCS 9-10 years ago. Not sure what the findings were. Brother was not helping her at that time. Thinks it may have been in [place] or here at [facility]. I do not see anything in our system. Not complained of rectal pain, burning, or itching. BMs daily. No constipation or straining. Rare loose stools.   No nausea or vomiting. Weight loss of about 7 lbs since February. Brother states she is eating ok. Sometimes she doesn't eat a lot. She will tell him she isn't hungry. Eats 2 meals a day as patient sleeps late in the day. Brother states she is tired a lot. She doesn't want to do much activity. No heartburn or reflux symptoms. No black stools.   No fever, chills, lightheadedness, dizziness, or feeling like she will pass out. No CP, palpitations, SOB at rest, or cough.   Brother states he would be able to help her with colonoscopy prep.    Past Medical History:  Diagnosis Date  . Anxiety   . Anxiety   . Coronary atherosclerosis of native coronary artery    Nonobstructive at catheterization June 2014  . DDD (degenerative disc disease), lumbar   . Depression   . Diabetes mellitus (HCC)    Borderline  . Essential hypertension, benign   . Hyperlipidemia   . Nephrolithiasis   . Psoriatic arthritis (HCC)   . Takotsubo cardiomyopathy    LVEF 60-65% September 2014    Past Surgical  History:  Procedure Laterality Date  . APPENDECTOMY    . BACK SURGERY  2014  . CHOLECYSTECTOMY    . LEFT HEART CATHETERIZATION WITH CORONARY ANGIOGRAM N/A 12/18/2012   Procedure: LEFT HEART CATHETERIZATION WITH CORONARY ANGIOGRAM;  Surgeon: Tonny Bollman, MD;  Location: [facility] CATH LAB;  Service: Cardiovascular;  Laterality: N/A;    Current Outpatient Medications  Medication Sig Dispense Refill  . aspirin 81 MG EC tablet Take 1 tablet (81 mg total) by mouth daily. Swallow whole. 30 tablet 12  . atorvastatin (LIPITOR) 10 MG tablet TAKE 1 TABLET AT 6:00 P.M. 30 tablet 3  . Biotin 5000 MCG CAPS Take 10,000 mcg by mouth daily.     . busPIRone (BUSPAR) 10 MG tablet Take 1 tablet by mouth 2 (two) times daily.    . calcium carbonate (OSCAL) 1500 (600 Ca) MG TABS tablet Take 600 mg of elemental calcium by mouth 2 (two) times daily with a meal.    . clobetasol cream (TEMOVATE) 0.05 % Apply 1 application topically 2 (two) times daily as needed.     . clonazePAM (KLONOPIN) 0.5 MG tablet Take 1 tablet by mouth daily as needed.    . desonide (DESOWEN) 0.05 % ointment Apply 1 application topically as needed.     . fish oil-omega-3 fatty acids 1000 MG capsule Take 2 g by mouth daily.     Marland Kitchen  fluocinonide cream (LIDEX) 0.05 % Apply 1 application topically as needed.     . hydroxypropyl methylcellulose / hypromellose (ISOPTO TEARS / GONIOVISC) 2.5 % ophthalmic solution 1 drop as needed.     . Ixekizumab (TALTZ) 80 MG/ML SOSY INJECT ONE SYRINGE SUBCUTANEOUSLY EVERY 4 WEEKS. REFRIGERATE. ALLOW PEN TO REACH ROOM TEMP PRIOR TO INJECTION.    Marland Kitchen levETIRAcetam (KEPPRA) 500 MG tablet Take 1 tablet (500 mg total) by mouth 2 (two) times daily. 60 tablet 5  . losartan-hydrochlorothiazide (HYZAAR) 50-12.5 MG per tablet Take 1 tablet by mouth daily.    . memantine (NAMENDA XR) 28 MG CP24 24 hr capsule Take 1 capsule (28 mg total) by mouth daily. 30 capsule 5  . metoprolol succinate (TOPROL-XL) 25 MG 24 hr tablet TAKE (1/2)  TABLET BY MOUTH DAILY. 15 tablet 6  . Multiple Vitamins-Minerals (CENTRUM SILVER PO) Take 1 tablet by mouth daily.    Marland Kitchen PARoxetine (PAXIL) 40 MG tablet Take 40 mg by mouth daily.    . tacrolimus (PROTOPIC) 0.1 % ointment as needed.     . triamcinolone lotion (KENALOG) 0.1 % Apply 1 application topically 2 (two) times daily as needed. With Silver Sulfadiazine 1%    . polyethylene glycol-electrolytes (NULYTELY/GOLYTELY) 420 g solution Take 4,000 mLs by mouth once for 1 dose. 4000 mL 0   No current facility-administered medications for this visit.     Allergies as of 03/14/2019 - Review Complete 03/14/2019  Allergen Reaction Noted  . Cyclobenzaprine  08/27/2016    Family History  Problem Relation Age of Onset  . CAD Father   . CAD Mother   . Hypertension Mother   . Breast cancer Mother   . Hypertension Brother         X 2  . Colon polyps Brother        thinks he is on 10 year surveillance program   . Cancer Maternal Grandmother        breast cancer   . Colon cancer Neg Hx     Social History   Socioeconomic History  . Marital status: Married    Spouse name: Not on file  . Number of children: Not on file  . Years of education: Not on file  . Highest education level: Not on file  Occupational History  . Not on file  Social Needs  . Financial resource strain: Not on file  . Food insecurity    Worry: Not on file    Inability: Not on file  . Transportation needs    Medical: Not on file    Non-medical: Not on file  Tobacco Use  . Smoking status: Never Smoker  . Smokeless tobacco: Never Used  Substance and Sexual Activity  . Alcohol use: No    Alcohol/week: 0.0 standard drinks  . Drug use: No  . Sexual activity: Not Currently  Lifestyle  . Physical activity    Days per week: Not on file    Minutes per session: Not on file  . Stress: Not on file  Relationships  . Social Musician on phone: Not on file    Gets together: Not on file    Attends religious  service: Not on file    Active member of club or organization: Not on file    Attends meetings of clubs or organizations: Not on file    Relationship status: Not on file  . Intimate partner violence    Fear of current or ex partner: Not  on file    Emotionally abused: Not on file    Physically abused: Not on file    Forced sexual activity: Not on file  Other Topics Concern  . Not on file  Social History Narrative  . Not on file    Review of Systems: Gen: See HPI HEENT: Denies nasal congestion or sore throat. CV: See HPI  Resp: See HPI GI: See HPI GU : Denies urinary burning, urinary frequency, urinary hesitancy MS: Admits to back pain and right hip pain.  Derm: Admits to psoriasis Psych: Denies depression, anxiety Heme: See HPI  Physical Exam: BP (!) 141/68   Pulse (!) 49   Temp (!) 96.6 F (35.9 C) (Temporal)   Ht 5' (1.524 m)   Wt 130 lb 3.2 oz (59.1 kg)   BMI 25.43 kg/m  General: Pleasant and cooperative. Well-nourished and well-developed.  Head:  Normocephalic and atraumatic. Eyes:  Without icterus, sclera clear and conjunctiva pink.  Ears:  Normal auditory acuity. Nose:  No deformity, discharge,  or lesions. Lungs:  Clear to auscultation bilaterally. No wheezes, rales, or rhonchi. No distress.  Heart:  S1, S2 present without murmurs appreciated.  Abdomen:  +BS, soft, non-tender and non-distended. No HSM noted. No guarding or rebound. No masses appreciated.  Rectal:  Deferred  Msk:  Symmetrical without gross deformities. Normal posture.. Extremities:  Without edema. Neurologic:  Grossly normal neurologically. Skin:  Intact without significant lesions or rashes. Psych:  Alert and cooperative but appears somewhat withdrawn.

## 2019-03-14 ENCOUNTER — Encounter: Payer: Self-pay | Admitting: *Deleted

## 2019-03-14 ENCOUNTER — Encounter: Payer: Self-pay | Admitting: Gastroenterology

## 2019-03-14 ENCOUNTER — Ambulatory Visit (INDEPENDENT_AMBULATORY_CARE_PROVIDER_SITE_OTHER): Payer: Medicare Other | Admitting: Gastroenterology

## 2019-03-14 ENCOUNTER — Other Ambulatory Visit: Payer: Self-pay | Admitting: *Deleted

## 2019-03-14 ENCOUNTER — Other Ambulatory Visit: Payer: Self-pay

## 2019-03-14 VITALS — BP 141/68 | HR 49 | Temp 96.6°F | Ht 60.0 in | Wt 130.2 lb

## 2019-03-14 DIAGNOSIS — K625 Hemorrhage of anus and rectum: Secondary | ICD-10-CM

## 2019-03-14 LAB — HEMOGLOBIN AND HEMATOCRIT, BLOOD
HCT: 46.2 % — ABNORMAL HIGH (ref 35.0–45.0)
Hemoglobin: 15.7 g/dL — ABNORMAL HIGH (ref 11.7–15.5)

## 2019-03-14 MED ORDER — PEG 3350-KCL-NA BICARB-NACL 420 G PO SOLR: 4000.0000 mL | Freq: Once | ORAL | 0 refills | Status: AC

## 2019-03-14 NOTE — Assessment & Plan Note (Addendum)
78 year old female with past medical history significant for dementia, CAD, Takotsubo cardiomyopathy, HTN, HLD, borderline diabetes, anxiety, and depression who presents with 8-10 episodes of tissue hematochezia that started within the last month.  Unclear whether blood has been in the stools. Last colonoscopy 9 to 10 years ago but not sure where or what the results were.  Denies hemorrhoids, rectal pain, burning, or itching, constipation or straining, abdominal pain, or melena.  She has lost about 7 pounds since February.  Brother states she does not eat a lot and is tired often.  Denies lightheadedness, dizziness, syncope or presyncope.  Labs on 02/16/2019 with hemoglobin 14.9.  Differentials include hemorrhoids versus colon polyps versus malignancy.  Update H&H. Proceed with TCS with propofol with Dr. Gala Romney in the near future.The risks, benefits, and alternatives have been discussed in detail with patient. They have stated understanding and desire to proceed.  Brother states he will be able to help patient with colonoscopy prep. ED return precautions given. Follow-up after procedure.

## 2019-03-14 NOTE — Patient Instructions (Addendum)
Please have blood work completed.   We will get you scheduled for a colonoscopy in the near future with Dr. Gala Romney.  If you develop lightheadedness, dizziness, feeling like you are going to pass out, significant fatigue, or significant increase in rectal bleeding please proceed to the emergency room.  We will see you back after your procedure.   Aliene Altes, PA-C [facility]

## 2019-03-19 ENCOUNTER — Telehealth: Payer: Self-pay

## 2019-03-19 NOTE — Telephone Encounter (Signed)
Pt's brother called to cancel pt's procedure for 05/31/2019. They don't wish to r/s at this time. Pt's brother said he would contact our office back.

## 2019-03-19 NOTE — Telephone Encounter (Signed)
Called endo and they had also called them to cancel. FYI [place]

## 2019-03-26 DIAGNOSIS — E7800 Pure hypercholesterolemia, unspecified: Secondary | ICD-10-CM | POA: Diagnosis not present

## 2019-03-26 DIAGNOSIS — E119 Type 2 diabetes mellitus without complications: Secondary | ICD-10-CM | POA: Diagnosis not present

## 2019-03-26 DIAGNOSIS — I10 Essential (primary) hypertension: Secondary | ICD-10-CM | POA: Diagnosis not present

## 2019-04-10 NOTE — Progress Notes (Signed)
NEUROLOGY FOLLOW UP OFFICE NOTE  Jennifer [name] [id number]  HISTORY OF PRESENT ILLNESS: Jennifer [name] is a 78 year old right-handed woman with hypertension, Takotsubo cardiomyopathy and hyperlipidemia who follows up for Alzheimer's dementia and recurrent episodes of unresponsiveness.  She is accompanied by her brother who supplements history.  UPDATE:  Current medications: Namenda XR 28 mg daily for Alzheimer's disease.  Keppra 500 mg twice daily for possible complex partial seizures.  For anxiety, she takes BuSpar 10mg  twice daily and clonazepam as needed (rarely).    No changes in last 9 months.  No spells.  Sleepy at times.  She sleeps well.  Appetite is overall good but has lost a little weight. Her brother picks out her clothes but can dress herself. Overall, she is able to bathe herself.  She uses toilet herself.  She require assistance with medication management.  Late at night, she may get confused and will want to leave the house, thinking that she lives in another house.  Her PCP prescribed clonazepam as needed.  She refers to her adult sons as if they are still little boys.  She has 24 hour supervision.    HISTORY: She began having noticeable memory problems in late 2014 after some medical issues, such as kidney stones, MI and back surgery. She began having problems remembering how to get to familiar places, such as the dentist or friend's house. She also reports difficulty remembering names of people she has known for years. She denies difficulty recognizing faces. She denies hallucinations, poor sleep or depression. She has not exhibited episodes of agitation or acute confusion. Her brother lives with her. Her son lives next door. Her brother now handles her bills and finances. She no longer drives. She performs all of her ADLs and keeps the house tidy. She exhibits no change in behavior or personality.  In May 2016, she had an episode of unresponsiveness lasting  several minutes. She was with her son when suddenly her head dropped with eyes still open but zoned out. She was unresponsive and did not talk. She did not exhibit any twitching or incontinence. She had an MRI of the brain performed on 11/13/14 which showed age appropriate atrophy and mild chronic small vessel disease. Carotid doppler performed on 11/25/14 showed less than 50% bilateral ICA stenosis. She has had two other similar spells only lasting about 30 seconds. EEG performed on 01/15/15, which was normal. She had another spell in on 08/24/16. She presented to [facility] for further evaluation. MRI of brain revealed mild chronic small vessel disease but no acute stroke or mass lesion. . Aricept was discontinued because it may lower seizure threshold.  Both parents died in their 6960s and had no history of dementia.  PAST MEDICAL HISTORY: Past Medical History:  Diagnosis Date  . Anxiety   . Anxiety   . Coronary atherosclerosis of native coronary artery    Nonobstructive at catheterization June 2014  . DDD (degenerative disc disease), lumbar   . Depression   . Diabetes mellitus (HCC)    Borderline  . Essential hypertension, benign   . Hyperlipidemia   . Nephrolithiasis   . Psoriatic arthritis (HCC)   . Takotsubo cardiomyopathy    LVEF 60-65% September 2014    MEDICATIONS: Current Outpatient Medications on File Prior to Visit  Medication Sig Dispense Refill  . aspirin 81 MG EC tablet Take 1 tablet (81 mg total) by mouth daily. Swallow whole. 30 tablet 12  . atorvastatin (LIPITOR) 10 MG  tablet TAKE 1 TABLET AT 6:00 P.M. 30 tablet 3  . Biotin 5000 MCG CAPS Take 10,000 mcg by mouth daily.     . busPIRone (BUSPAR) 10 MG tablet Take 1 tablet by mouth 2 (two) times daily.    . calcium carbonate (OSCAL) 1500 (600 Ca) MG TABS tablet Take 600 mg of elemental calcium by mouth 2 (two) times daily with a meal.    . clobetasol cream (TEMOVATE) 0.05 % Apply 1 application topically  2 (two) times daily as needed.     . clonazePAM (KLONOPIN) 0.5 MG tablet Take 1 tablet by mouth daily as needed.    . desonide (DESOWEN) 0.05 % ointment Apply 1 application topically as needed.     . fish oil-omega-3 fatty acids 1000 MG capsule Take 2 g by mouth daily.     . fluocinonide cream (LIDEX) 0.05 % Apply 1 application topically as needed.     . hydroxypropyl methylcellulose / hypromellose (ISOPTO TEARS / GONIOVISC) 2.5 % ophthalmic solution 1 drop as needed.     . Ixekizumab (TALTZ) 80 MG/ML SOSY INJECT ONE SYRINGE SUBCUTANEOUSLY EVERY 4 WEEKS. REFRIGERATE. ALLOW PEN TO REACH ROOM TEMP PRIOR TO INJECTION.    Marland Kitchen levETIRAcetam (KEPPRA) 500 MG tablet Take 1 tablet (500 mg total) by mouth 2 (two) times daily. 60 tablet 5  . losartan-hydrochlorothiazide (HYZAAR) 50-12.5 MG per tablet Take 1 tablet by mouth daily.    . memantine (NAMENDA XR) 28 MG CP24 24 hr capsule Take 1 capsule (28 mg total) by mouth daily. 30 capsule 5  . metoprolol succinate (TOPROL-XL) 25 MG 24 hr tablet TAKE (1/2) TABLET BY MOUTH DAILY. 15 tablet 6  . Multiple Vitamins-Minerals (CENTRUM SILVER PO) Take 1 tablet by mouth daily.    Marland Kitchen PARoxetine (PAXIL) 40 MG tablet Take 40 mg by mouth daily.    . tacrolimus (PROTOPIC) 0.1 % ointment as needed.     . triamcinolone lotion (KENALOG) 0.1 % Apply 1 application topically 2 (two) times daily as needed. With Silver Sulfadiazine 1%     No current facility-administered medications on file prior to visit.     ALLERGIES: Allergies  Allergen Reactions  . Cyclobenzaprine     TOO STRONG  CAN'T USE AND WORK    FAMILY HISTORY: Family History  Problem Relation Age of Onset  . CAD Father   . CAD Mother   . Hypertension Mother   . Breast cancer Mother   . Hypertension Brother         X 2  . Colon polyps Brother        thinks he is on 10 year surveillance program   . Cancer Maternal Grandmother        breast cancer   . Colon cancer Neg Hx    SOCIAL HISTORY: Social  History   Socioeconomic History  . Marital status: Married    Spouse name: Not on file  . Number of children: Not on file  . Years of education: Not on file  . Highest education level: Not on file  Occupational History  . Not on file  Social Needs  . Financial resource strain: Not on file  . Food insecurity    Worry: Not on file    Inability: Not on file  . Transportation needs    Medical: Not on file    Non-medical: Not on file  Tobacco Use  . Smoking status: Never Smoker  . Smokeless tobacco: Never Used  Substance and Sexual Activity  .  Alcohol use: No    Alcohol/week: 0.0 standard drinks  . Drug use: No  . Sexual activity: Not Currently  Lifestyle  . Physical activity    Days per week: Not on file    Minutes per session: Not on file  . Stress: Not on file  Relationships  . Social Herbalist on phone: Not on file    Gets together: Not on file    Attends religious service: Not on file    Active member of club or organization: Not on file    Attends meetings of clubs or organizations: Not on file    Relationship status: Not on file  . Intimate partner violence    Fear of current or ex partner: Not on file    Emotionally abused: Not on file    Physically abused: Not on file    Forced sexual activity: Not on file  Other Topics Concern  . Not on file  Social History Narrative  . Not on file    REVIEW OF SYSTEMS: Constitutional: No fevers, chills, or sweats, no generalized fatigue, change in appetite Eyes: No visual changes, double vision, eye pain Ear, nose and throat: No hearing loss, ear pain, nasal congestion, sore throat Cardiovascular: No chest pain, palpitations Respiratory:  No shortness of breath at rest or with exertion, wheezes GastrointestinaI: No nausea, vomiting, diarrhea, abdominal pain, fecal incontinence Genitourinary:  No dysuria, urinary retention or frequency Musculoskeletal:  No neck pain, back pain Integumentary: No rash,  pruritus, skin lesions Neurological: as above Psychiatric: No depression, insomnia, anxiety Endocrine: No palpitations, fatigue, diaphoresis, mood swings, change in appetite, change in weight, increased thirst Hematologic/Lymphatic:  No purpura, petechiae. Allergic/Immunologic: no itchy/runny eyes, nasal congestion, recent allergic reactions, rashes  PHYSICAL EXAM: Blood pressure (!) 153/75, pulse (!) 53, height 5\' 2"  (1.575 m), weight 130 lb 9.6 oz (59.2 kg), SpO2 92 %. General: No acute distress.  Patient appears well-groomed.   Head:  Normocephalic/atraumatic Eyes:  Fundi examined but not visualized Neck: supple, no paraspinal tenderness, full range of motion Heart:  Regular rate and rhythm Lungs:  Clear to auscultation bilaterally Back: No paraspinal tenderness Neurological Exam: alert and oriented to person but not place and time. Attention span and concentration poor, recent and remote memory impaired, fund of knowledge impaired.  Speech fluent and not dysarthric, language intact.   MMSE - Mini Mental State Exam 04/13/2019 07/13/2018 10/11/2017 04/12/2017 11/10/2016  Not completed: - Unable to complete - Unable to complete -  Orientation to time 0 - 1 - 0  Orientation to Place 1 - 3 - 4  Registration 3 - 3 - 3  Attention/ Calculation 0 - 1 - 0  Recall 0 - 0 - 0  Language- name 2 objects 1 - 2 - 2  Language- repeat 1 - 1 - 1  Language- follow 3 step command 3 - 3 - 3  Language- read & follow direction 1 - 1 - 1  Write a sentence 0 - 1 - 1  Copy design 0 - 0 - 0  Total score 10 - 16 - 15    Left irregular non-reactive surgical pupil.  Otherwise, CN II-XII intact. Bulk and tone normal, muscle strength 5/5 throughout.  Sensation to light touch intact.  Deep tendon reflexes 2+ throughot, toes downgoing.  Finger to nose testing intact.  Gait normal  IMPRESSION: 1.  Alzheimer's dementia 2.  Recurrent transient spells, possibly complex partial seizures, stable.  PLAN: 1.  Namenda  XR  daily 2.  Keppra  twice daily 3.  BuSpar daily and clonazepam PRN for agitation as per PCP.  Discussed side effects of benzodiazepine on dementia patients.  It is used sparingly.  Also consider second generation antipsychotics (black box warning discussed with brother).  Will defer for now. 4.  24 hour supervision 5.  Follow up in 9 months.  26 minutes spent face to face with patient, over 50% spent discussing management.   Shon Millet, DO  CC:  Kirstie Peri, MD

## 2019-04-13 ENCOUNTER — Encounter: Payer: Self-pay | Admitting: Neurology

## 2019-04-13 ENCOUNTER — Other Ambulatory Visit: Payer: Self-pay

## 2019-04-13 ENCOUNTER — Ambulatory Visit (INDEPENDENT_AMBULATORY_CARE_PROVIDER_SITE_OTHER): Payer: Medicare Other | Admitting: Neurology

## 2019-04-13 VITALS — BP 153/75 | HR 53 | Ht 62.0 in | Wt 130.6 lb

## 2019-04-13 DIAGNOSIS — F0280 Dementia in other diseases classified elsewhere without behavioral disturbance: Secondary | ICD-10-CM

## 2019-04-13 DIAGNOSIS — G301 Alzheimer's disease with late onset: Secondary | ICD-10-CM | POA: Diagnosis not present

## 2019-04-13 NOTE — Patient Instructions (Signed)
No change in management at this time Follow up in 9 months

## 2019-04-29 NOTE — Telephone Encounter (Signed)
Noted  

## 2019-05-14 DIAGNOSIS — I739 Peripheral vascular disease, unspecified: Secondary | ICD-10-CM | POA: Diagnosis not present

## 2019-05-14 DIAGNOSIS — Z6826 Body mass index (BMI) 26.0-26.9, adult: Secondary | ICD-10-CM | POA: Diagnosis not present

## 2019-05-14 DIAGNOSIS — S93401A Sprain of unspecified ligament of right ankle, initial encounter: Secondary | ICD-10-CM | POA: Diagnosis not present

## 2019-05-14 DIAGNOSIS — Z299 Encounter for prophylactic measures, unspecified: Secondary | ICD-10-CM | POA: Diagnosis not present

## 2019-05-14 DIAGNOSIS — F0390 Unspecified dementia without behavioral disturbance: Secondary | ICD-10-CM | POA: Diagnosis not present

## 2019-05-14 DIAGNOSIS — I10 Essential (primary) hypertension: Secondary | ICD-10-CM | POA: Diagnosis not present

## 2019-05-22 DIAGNOSIS — E119 Type 2 diabetes mellitus without complications: Secondary | ICD-10-CM | POA: Diagnosis not present

## 2019-05-22 DIAGNOSIS — I10 Essential (primary) hypertension: Secondary | ICD-10-CM | POA: Diagnosis not present

## 2019-05-22 DIAGNOSIS — E7800 Pure hypercholesterolemia, unspecified: Secondary | ICD-10-CM | POA: Diagnosis not present

## 2019-05-29 ENCOUNTER — Other Ambulatory Visit (HOSPITAL_COMMUNITY): Payer: Medicare Other

## 2019-05-31 ENCOUNTER — Encounter (HOSPITAL_COMMUNITY): Payer: Self-pay

## 2019-05-31 ENCOUNTER — Ambulatory Visit (HOSPITAL_COMMUNITY): Admit: 2019-05-31 | Payer: Medicare Other | Admitting: Internal Medicine

## 2019-05-31 SURGERY — COLONOSCOPY WITH PROPOFOL
Anesthesia: Monitor Anesthesia Care

## 2019-06-18 ENCOUNTER — Telehealth: Payer: Self-pay | Admitting: Internal Medicine

## 2019-06-18 NOTE — Telephone Encounter (Signed)
Pt was calling to reschedule her colonoscopy. [phone]

## 2019-06-18 NOTE — Telephone Encounter (Signed)
Jennifer [name], she was last seen in September. Does she need OV to reschedule TCS w/Propofol w/RMR?

## 2019-06-19 NOTE — Telephone Encounter (Signed)
When would she be scheduled?

## 2019-06-20 NOTE — Telephone Encounter (Signed)
Yes, I think we need to schedule office visit. You can hold a spot for her, but we will need to see her prior to officially schedule.

## 2019-06-20 NOTE — Telephone Encounter (Signed)
1st available would be in March.

## 2019-06-20 NOTE — Telephone Encounter (Signed)
Spoke to pt's brother Francee Piccolo), she has already been scheduled OV 07/02/19. Holding spot for TCS 08/30/19. Brother wants to discuss admission for prep assistance at [facility].

## 2019-06-25 DIAGNOSIS — E119 Type 2 diabetes mellitus without complications: Secondary | ICD-10-CM | POA: Diagnosis not present

## 2019-06-25 DIAGNOSIS — E7800 Pure hypercholesterolemia, unspecified: Secondary | ICD-10-CM | POA: Diagnosis not present

## 2019-06-25 DIAGNOSIS — I10 Essential (primary) hypertension: Secondary | ICD-10-CM | POA: Diagnosis not present

## 2019-07-01 NOTE — Progress Notes (Signed)
Referring Provider: Kirstie Peri, MD Primary Care Physician:  Kirstie Peri, MD Primary GI Physician: Dr. Jena Gauss  Chief Complaint  Patient presents with  . Colonoscopy    ?reschedule    HPI:   Jennifer [name] is a 79 y.o. female presenting today to reschedule her colonoscopy.  She was last seen on 03/14/2019 at the request of Dr. Sherril Croon for rectal bleeding.  She presented with her brother who provided most of the history due to worsening dementia.  Brother reported bright red blood on toilet tissue and possibly in the stool occurring 8-10 times within the last month.  Denies constipation or straining.  Denied rectal pain or burning, or itching.  7 pound weight loss in 7 months.  Plans were to update H&H, and pursue colonoscopy with propofol.  Labs on 03/14/2019 with hemoglobin 15.7. Colonoscopy was scheduled for 05/31/2019 but patient called to reschedule.  Today: Presents with brother who provides most of the history. Brother doesn't think patient has had any recurrence of rectal bleeding. Doesn't typically help her while she is in the bathroom, just helps patient get to the bathroom to ensure she doesn't fall.  Thinks he may have seen blood on the toilet tissue once since we saw them last when she forgot to flush the toilet.  Not sure if patients history of reliable due to dementia. Patient denies rectal bleeding. No abdominal pain. BMs daily. No constipation or diarrhea. No rectal burning, itching, or pain.Taking MiraLAX daily. No black stools. No nausea or vomiting. No heartburn or reflux. No trouble swallow. Appetite is ok. Sometimes she doesn't eat much. Weight is stable since last visit.   Last TCS 9-10 years ago. Not sure where or the results.    Past Medical History:  Diagnosis Date  . Anxiety   . Anxiety   . Coronary atherosclerosis of native coronary artery    Nonobstructive at catheterization June 2014  . DDD (degenerative disc disease), lumbar   . Depression   . Diabetes  mellitus (HCC)    Borderline  . Essential hypertension, benign   . Hyperlipidemia   . Nephrolithiasis   . Psoriatic arthritis (HCC)   . Takotsubo cardiomyopathy    LVEF 60-65% September 2014    Past Surgical History:  Procedure Laterality Date  . APPENDECTOMY    . BACK SURGERY  2014  . CHOLECYSTECTOMY    . LEFT HEART CATHETERIZATION WITH CORONARY ANGIOGRAM N/A 12/18/2012   Procedure: LEFT HEART CATHETERIZATION WITH CORONARY ANGIOGRAM;  Surgeon: Tonny Bollman, MD;  Location: [facility] CATH LAB;  Service: Cardiovascular;  Laterality: N/A;    Current Outpatient Medications  Medication Sig Dispense Refill  . aspirin 81 MG EC tablet Take 1 tablet (81 mg total) by mouth daily. Swallow whole. 30 tablet 12  . atorvastatin (LIPITOR) 10 MG tablet TAKE 1 TABLET AT 6:00 P.M. 30 tablet 3  . Biotin 5000 MCG CAPS Take 10,000 mcg by mouth daily.     . busPIRone (BUSPAR) 10 MG tablet Take 1 tablet by mouth 2 (two) times daily.    . calcium carbonate (OSCAL) 1500 (600 Ca) MG TABS tablet Take 600 mg of elemental calcium by mouth 2 (two) times daily with a meal.    . clobetasol cream (TEMOVATE) 0.05 % Apply 1 application topically 2 (two) times daily as needed.     . clonazePAM (KLONOPIN) 0.5 MG tablet Take 1 tablet by mouth daily as needed.    . desonide (DESOWEN) 0.05 % ointment Apply 1 application topically  as needed.     . fish oil-omega-3 fatty acids 1000 MG capsule Take 2 g by mouth daily.     . fluocinonide cream (LIDEX) 6.30 % Apply 1 application topically as needed.     . hydroxypropyl methylcellulose / hypromellose (ISOPTO TEARS / GONIOVISC) 2.5 % ophthalmic solution 1 drop as needed.     . Ixekizumab (TALTZ) 80 MG/ML SOSY INJECT ONE SYRINGE SUBCUTANEOUSLY EVERY 4 WEEKS. REFRIGERATE. ALLOW PEN TO REACH ROOM TEMP PRIOR TO INJECTION.    Marland Kitchen levETIRAcetam (KEPPRA) 500 MG tablet Take 1 tablet (500 mg total) by mouth 2 (two) times daily. 60 tablet 5  . losartan-hydrochlorothiazide (HYZAAR) 50-12.5 MG per  tablet Take 1 tablet by mouth daily.    . memantine (NAMENDA XR) 28 MG CP24 24 hr capsule Take 1 capsule (28 mg total) by mouth daily. 30 capsule 5  . metoprolol succinate (TOPROL-XL) 25 MG 24 hr tablet TAKE (1/2) TABLET BY MOUTH DAILY. 15 tablet 6  . Multiple Vitamins-Minerals (CENTRUM SILVER PO) Take 1 tablet by mouth daily.    Marland Kitchen PARoxetine (PAXIL) 40 MG tablet Take 40 mg by mouth daily.    . tacrolimus (PROTOPIC) 0.1 % ointment as needed.     . triamcinolone lotion (KENALOG) 0.1 % Apply 1 application topically 2 (two) times daily as needed. With Silver Sulfadiazine 1%    . polyethylene glycol-electrolytes (NULYTELY) 420 g solution As directed 4000 mL 0   No current facility-administered medications for this visit.    Allergies as of 07/02/2019 - Review Complete 07/02/2019  Allergen Reaction Noted  . Cyclobenzaprine  08/27/2016    Family History  Problem Relation Age of Onset  . CAD Father   . CAD Mother   . Hypertension Mother   . Breast cancer Mother   . Hypertension Brother         X 2  . Colon polyps Brother        thinks he is on 75 year surveillance program   . Cancer Maternal Grandmother        breast cancer   . Colon cancer Neg Hx     Social History   Socioeconomic History  . Marital status: Married    Spouse name: Not on file  . Number of children: 2  . Years of education: Not on file  . Highest education level: High school graduate  Occupational History  . Not on file  Tobacco Use  . Smoking status: Never Smoker  . Smokeless tobacco: Never Used  Substance and Sexual Activity  . Alcohol use: No    Alcohol/week: 0.0 standard drinks  . Drug use: No  . Sexual activity: Not Currently  Other Topics Concern  . Not on file  Social History Narrative  . Not on file   Social Determinants of Health   Financial Resource Strain:   . Difficulty of Paying Living Expenses: Not on file  Food Insecurity:   . Worried About Charity fundraiser in the Last Year: Not  on file  . Ran Out of Food in the Last Year: Not on file  Transportation Needs:   . Lack of Transportation (Medical): Not on file  . Lack of Transportation (Non-Medical): Not on file  Physical Activity:   . Days of Exercise per Week: Not on file  . Minutes of Exercise per Session: Not on file  Stress:   . Feeling of Stress : Not on file  Social Connections:   . Frequency of Communication with Friends  and Family: Not on file  . Frequency of Social Gatherings with Friends and Family: Not on file  . Attends Religious Services: Not on file  . Active Member of Clubs or Organizations: Not on file  . Attends Banker Meetings: Not on file  . Marital Status: Not on file    Review of Systems: Gen: Denies fever, chills, pre-syncope, or syncope.  CV: Denies chest pain or palpitations Resp: Denies dyspnea at rest or cough GI: See HPI Derm: Admits to psoriasis.  Psych: Takes medications for depression/anxiety.  Heme: Denies bruising.   Physical Exam: BP (!) 142/54   Pulse (!) 55   Temp (!) 96.8 F (36 C) (Temporal)   Ht 5\' 2"  (1.575 m)   Wt 131 lb 3.2 oz (59.5 kg)   BMI 24.00 kg/m  General:   Appears stated age. No distress noted.  Head:  Normocephalic and atraumatic. Eyes:  Conjuctiva clear without scleral icterus. Heart:  S1, S2 present without murmurs appreciated. Lungs:  Clear to auscultation bilaterally. No wheezes, rales, or rhonchi. No distress.  Abdomen:  +BS, soft, non-tender and non-distended. No rebound or guarding. No HSM or masses noted. Msk:  Symmetrical without gross deformities. Normal posture. Extremities:  Without edema. Psych:  Alert and cooperative. Flat affect.

## 2019-07-02 ENCOUNTER — Other Ambulatory Visit: Payer: Self-pay

## 2019-07-02 ENCOUNTER — Ambulatory Visit (INDEPENDENT_AMBULATORY_CARE_PROVIDER_SITE_OTHER): Payer: Medicare Other | Admitting: Gastroenterology

## 2019-07-02 ENCOUNTER — Other Ambulatory Visit: Payer: Self-pay | Admitting: *Deleted

## 2019-07-02 ENCOUNTER — Encounter: Payer: Self-pay | Admitting: *Deleted

## 2019-07-02 ENCOUNTER — Encounter: Payer: Self-pay | Admitting: Gastroenterology

## 2019-07-02 VITALS — BP 142/54 | HR 55 | Temp 96.8°F | Ht 62.0 in | Wt 131.2 lb

## 2019-07-02 DIAGNOSIS — K625 Hemorrhage of anus and rectum: Secondary | ICD-10-CM

## 2019-07-02 MED ORDER — PEG 3350-KCL-NA BICARB-NACL 420 G PO SOLR: ORAL | 0 refills | Status: DC

## 2019-07-02 NOTE — Assessment & Plan Note (Addendum)
79 year old female with past medical history significant for dementia, CAD, Takotsubo cardiomyopathy with normal EF thereafter, HTN, HLD, borderline diabetes, anxiety, and depression presenting to reschedule her colonoscopy due to rectal bleeding.  Colonoscopy was originally scheduled in December 2020 after we saw patient on 03/14/2019. Rectal bleeding started in August/September 2020 with 8-10 episodes of toilet tissue hematochezia in a months time, confirmed by patient's brother.  Unclear whether blood was in the stool.  Hemoglobin 15.7 on 03/14/2019.  Since last visit, brother states he is not sure if she has had any recurrence of rectal bleeding as he does not typically help her in the bathroom.  Thinks patient may have had one episode of toilet tissue hematochezia. Patient denies recurrence of rectal bleeding but not sure if this is reliable with her dementia.  Had lost 7 pounds between February and September 2020 but weight has been stable since we saw her last.  Last colonoscopy 9 to 10 years ago but not sure where what the results were.   Differentials include hemorrhoids versus colon polyps versus malignancy.  Proceed with TCS with propofol with Dr. Jena Gauss in the near future.  Spot has been held for 08/30/2019. The risks, benefits, and alternatives have been discussed in detail with patient. They have stated understanding and desire to proceed.   Propofol due to polypharmacy. Brother states he will be able to help patient with colonoscopy prep. Follow-up after procedure.

## 2019-07-02 NOTE — Patient Instructions (Addendum)
We will proceed with getting you scheduled for your colonoscopy with Dr. Jena Gauss.  We will follow up with you in the office after your procedure.  Should you have questions or concerns arise regarding your colonoscopy prep or other concerns, do not hesitate to call.  Ermalinda Memos, PA-C [facility]

## 2019-07-03 ENCOUNTER — Encounter: Payer: Self-pay | Admitting: *Deleted

## 2019-07-04 ENCOUNTER — Telehealth: Payer: Self-pay | Admitting: Internal Medicine

## 2019-07-04 NOTE — Telephone Encounter (Signed)
Spoke with patient brother. He is afraid he will not be able to get his sister to drink the prep or fluid. Her dementia is worsening to the point where she doesn't know who he is or her son. He thinks we should cancel but he doesn't know what to do.

## 2019-07-04 NOTE — Telephone Encounter (Signed)
Pt's brother called to say that he has very strong concerns about not being able to help the patient drink her bowel prep. He said she "wasn't in her right mind" and has dementia. He stated that she doesn't even know him or her son. Mr Hollice Espy said he would think it would be best just to cancel the procedure for 08/30/2019 with RMR [phone]

## 2019-07-04 NOTE — Telephone Encounter (Signed)
Called pt brother, line rang numerous times

## 2019-07-04 NOTE — Telephone Encounter (Signed)
Unfortunately, due to CODID-19 pandemic and increasing number of cases/hospitalizations, we can not admit for colon prep. If her brother doesn't feel like he can help her with the prep, we will have to cancel the procedure for now. At her last OV, her brother stated he did not think she had any recurrent rectal bleeding. If he does cancel the procedure, I would advise he find a way to monitor her stools to ensure she isn't continuing to have rectal bleeding that is going unnoticed.

## 2019-07-04 NOTE — Telephone Encounter (Signed)
Called spoke with patient brother. Advised of below. Procedure will be cancelled. Aware to call if he does see she is having RB. Called endo and LMOVM to cancel.

## 2019-07-25 ENCOUNTER — Telehealth: Payer: Self-pay | Admitting: Cardiology

## 2019-07-25 NOTE — Telephone Encounter (Signed)
Virtual Visit Pre-Appointment Phone Call  "(Name), I am calling you today to discuss your upcoming appointment. We are currently trying to limit exposure to the virus that causes COVID-19 by seeing patients at home rather than in the office."  1. "What is the BEST phone number to call the day of the visit?" - include this in appointment notes  2. "Do you have or have access to (through a family member/friend) a smartphone with video capability that we can use for your visit?" a. If yes - list this number in appt notes as "cell" (if different from BEST phone #) and list the appointment type as a VIDEO visit in appointment notes b. If no - list the appointment type as a PHONE visit in appointment notes  Confirm consent - "In the setting of the current Covid19 crisis, you are scheduled for a (phone or video) visit with your provider on (date) at (time).  Just as we do with many in-office visits, in order for you to participate in this visit, we must obtain consent.  If you'd like, I can send this to your mychart (if signed up) or email for you to review.  Otherwise, I can obtain your verbal consent now.  All virtual visits are billed to your insurance company just like a normal visit would be.  By agreeing to a virtual visit, we'd like you to understand that the technology does not allow for your provider to perform an examination, and thus may limit your provider's ability to fully assess your condition. If your provider identifies any concerns that need to be evaluated in person, we will make arrangements to do so.  Finally, though the technology is pretty good, we cannot assure that it will always work on either your or our end, and in the setting of a video visit, we may have to convert it to a phone-only visit.  In either situation, we cannot ensure that we have a secure connection.  Are you willing to proceed?" STAFF: Did the patient verbally acknowledge consent to telehealth visit? Document  YES/NO here: yes 3. Advise patient to be prepared - "Two hours prior to your appointment, go ahead and check your blood pressure, pulse, oxygen saturation, and your weight (if you have the equipment to check those) and write them all down. When your visit starts, your provider will ask you for this information. If you have an Apple Watch or Kardia device, please plan to have heart rate information ready on the day of your appointment. Please have a pen and paper handy nearby the day of the visit as well."  4. Give patient instructions for MyChart download to smartphone OR Doximity/Doxy.me as below if video visit (depending on what platform provider is using)  5. Inform patient they will receive a phone call 15 minutes prior to their appointment time (may be from unknown caller ID) so they should be prepared to answer    TELEPHONE CALL NOTE  Jennifer [name] has been deemed a candidate for a follow-up tele-health visit to limit community exposure during the Covid-19 pandemic. I spoke with the patient via phone to ensure availability of phone/video source, confirm preferred email & phone number, and discuss instructions and expectations.  I reminded Jennifer [name] to be prepared with any vital sign and/or heart rhythm information that could potentially be obtained via home monitoring, at the time of her visit. I reminded Jennifer [name] to expect a phone call prior to her visit.  [name] 07/25/2019 3:37 PM   INSTRUCTIONS FOR DOWNLOADING THE MYCHART APP TO SMARTPHONE  - The patient must first make sure to have activated MyChart and know their login information - If Apple, go to [employer] and type in MyChart in the search bar and download the app. If Android, ask patient to go to [employer] and type in [place] in the search bar and download the app. The app is free but as with any other app downloads, their phone may require them to verify saved payment information or Apple/Android  password.  - The patient will need to then log into the app with their MyChart username and password, and select [facility] as their healthcare provider to link the account. When it is time for your visit, go to the MyChart app, find appointments, and click Begin Video Visit. Be sure to Select Allow for your device to access the Microphone and Camera for your visit. You will then be connected, and your provider will be with you shortly.  **If they have any issues connecting, or need assistance please contact MyChart service desk [phone])**  **If using a computer, in order to ensure the best quality for their visit they will need to use either of the following Internet Browsers: [employer] or Google Chrome**  IF USING DOXIMITY or DOXY.ME - The patient will receive a link just prior to their visit by text.     FULL LENGTH CONSENT FOR TELE-HEALTH VISIT   I hereby voluntarily request, consent and authorize [facility] and its employed or contracted physicians, physician assistants, nurse practitioners or other licensed health care professionals (the Practitioner), to provide me with telemedicine health care services (the "Services") as deemed necessary by the treating Practitioner. I acknowledge and consent to receive the Services by the Practitioner via telemedicine. I understand that the telemedicine visit will involve communicating with the Practitioner through live audiovisual communication technology and the disclosure of certain medical information by electronic transmission. I acknowledge that I have been given the opportunity to request an in-person assessment or other available alternative prior to the telemedicine visit and am voluntarily participating in the telemedicine visit.  I understand that I have the right to withhold or withdraw my consent to the use of telemedicine in the course of my care at any time, without affecting my right to future care or treatment,  and that the Practitioner or I may terminate the telemedicine visit at any time. I understand that I have the right to inspect all information obtained and/or recorded in the course of the telemedicine visit and may receive copies of available information for a reasonable fee.  I understand that some of the potential risks of receiving the Services via telemedicine include:  Marland Kitchen Delay or interruption in medical evaluation due to technological equipment failure or disruption; . Information transmitted may not be sufficient (e.g. poor resolution of images) to allow for appropriate medical decision making by the Practitioner; and/or  . In rare instances, security protocols could fail, causing a breach of personal health information.  Furthermore, I acknowledge that it is my responsibility to provide information about my medical history, conditions and care that is complete and accurate to the best of my ability. I acknowledge that Practitioner's advice, recommendations, and/or decision may be based on factors not within their control, such as incomplete or inaccurate data provided by me or distortions of diagnostic images or specimens that may result from electronic transmissions. I understand that the  practice of medicine is not an Chief Strategy Officer and that Practitioner makes no warranties or guarantees regarding treatment outcomes. I acknowledge that I will receive a copy of this consent concurrently upon execution via email to the email address I last provided but may also request a printed copy by calling the office of [place].    I understand that my insurance will be billed for this visit.   I have read or had this consent read to me. . I understand the contents of this consent, which adequately explains the benefits and risks of the Services being provided via telemedicine.  . I have been provided ample opportunity to ask questions regarding this consent and the Services and have had my questions  answered to my satisfaction. . I give my informed consent for the services to be provided through the use of telemedicine in my medical care  By participating in this telemedicine visit I agree to the above.

## 2019-07-31 ENCOUNTER — Telehealth: Payer: Self-pay | Admitting: Internal Medicine

## 2019-07-31 ENCOUNTER — Other Ambulatory Visit: Payer: Self-pay

## 2019-07-31 MED ORDER — PEG 3350-KCL-NA BICARB-NACL 420 G PO SOLR: 4000.0000 mL | ORAL | 0 refills | Status: DC

## 2019-07-31 NOTE — Telephone Encounter (Signed)
Noted  

## 2019-07-31 NOTE — Telephone Encounter (Signed)
Pt's brother, Babette Relic, called to say that he is the caregiver to his sister and recently cancelled her procedure due to difficulty in having her drink the prep. He wants to reschedule procedure and "promised" to not cancel it this time. Pt was seen in office on 07/02/2019. Please advise if she will need another OV or can we reschedule her procedure, [phone]

## 2019-07-31 NOTE — Telephone Encounter (Signed)
Called brother, TCS w/Prop w/RMR rescheduled to 10/01/19 at 2:00pm. He thinks he can get her to drink prep by mixing flavored drinks she likes with it. Reminded nothing red in color-verbalized understanding. Rx for prep resent to pharmacy. Orders entered.   FYI to [facility].

## 2019-07-31 NOTE — Telephone Encounter (Signed)
Pre-op and COVID test 09/27/19. Letter mailed with procedure instructions.

## 2019-08-01 ENCOUNTER — Encounter: Payer: Self-pay | Admitting: Cardiology

## 2019-08-01 ENCOUNTER — Telehealth (INDEPENDENT_AMBULATORY_CARE_PROVIDER_SITE_OTHER): Payer: Medicare Other | Admitting: Cardiology

## 2019-08-01 DIAGNOSIS — I10 Essential (primary) hypertension: Secondary | ICD-10-CM | POA: Diagnosis not present

## 2019-08-01 DIAGNOSIS — I5181 Takotsubo syndrome: Secondary | ICD-10-CM | POA: Diagnosis not present

## 2019-08-01 NOTE — Progress Notes (Signed)
Virtual Visit via Telephone Note   This visit type was conducted due to national recommendations for restrictions regarding the COVID-19 Pandemic (e.g. social distancing) in an effort to limit this patient's exposure and mitigate transmission in our community.  Due to her co-morbid illnesses, this patient is at least at moderate risk for complications without adequate follow up.  This format is felt to be most appropriate for this patient at this time.  The patient did not have access to video technology/had technical difficulties with video requiring transitioning to audio format only (telephone).  All issues noted in this document were discussed and addressed.  No physical exam could be performed with this format.  Please refer to the patient's chart for her  consent to telehealth for [facility].   Date:  08/01/2019   ID:  Jennifer [name], DOB July 14, 1940, MRN [id number]  Patient Location: Home Provider Location: Office  PCP:  Kirstie Peri, MD  Cardiologist:  Nona Dell, MD Electrophysiologist:  None   Evaluation Performed:  Follow-Up Visit  Chief Complaint:  Cardiac follow-up  History of Present Illness:    Jennifer [name] is a 79 y.o. female last assessed via telehealth encounter in August 2020.  I spoke with her brother who is her primary caregiver.  He stated that she was resting.  She does some limited ambulation with his assistance, walks in the driveway, has not been getting out in public much during the pandemic however.  They both plan to get the vaccine.  I reviewed her medications which are outlined below.  Cardiac regimen includes aspirin, Lipitor, Hyzaar, Toprol-XL.   Past Medical History:  Diagnosis Date  . Anxiety   . Borderline diabetes   . Coronary atherosclerosis of native coronary artery    Nonobstructive at catheterization June 2014  . DDD (degenerative disc disease), lumbar   . Depression   . Essential hypertension   . Hyperlipidemia   .  Nephrolithiasis   . Psoriatic arthritis (HCC)   . Takotsubo cardiomyopathy    LVEF 60-65% September 2014   Past Surgical History:  Procedure Laterality Date  . APPENDECTOMY    . BACK SURGERY  2014  . CHOLECYSTECTOMY    . LEFT HEART CATHETERIZATION WITH CORONARY ANGIOGRAM N/A 12/18/2012   Procedure: LEFT HEART CATHETERIZATION WITH CORONARY ANGIOGRAM;  Surgeon: Tonny Bollman, MD;  Location: [facility] CATH LAB;  Service: Cardiovascular;  Laterality: N/A;     Current Meds  Medication Sig  . aspirin 81 MG EC tablet Take 1 tablet (81 mg total) by mouth daily. Swallow whole.  Marland Kitchen atorvastatin (LIPITOR) 10 MG tablet TAKE 1 TABLET AT 6:00 P.M.  . Biotin 5000 MCG CAPS Take 10,000 mcg by mouth daily.   . busPIRone (BUSPAR) 10 MG tablet Take 1 tablet by mouth 2 (two) times daily.  . calcium carbonate (OSCAL) 1500 (600 Ca) MG TABS tablet Take 600 mg of elemental calcium by mouth 2 (two) times daily with a meal.  . clobetasol cream (TEMOVATE) 0.05 % Apply 1 application topically 2 (two) times daily as needed.   . clonazePAM (KLONOPIN) 0.5 MG tablet Take 1 tablet by mouth daily as needed.  . desonide (DESOWEN) 0.05 % ointment Apply 1 application topically as needed.   . fish oil-omega-3 fatty acids 1000 MG capsule Take 1 g by mouth daily.   . fluocinonide cream (LIDEX) 0.05 % Apply 1 application topically as needed.   . hydroxypropyl methylcellulose / hypromellose (ISOPTO TEARS / GONIOVISC) 2.5 % ophthalmic solution 1 drop  as needed.   . Ixekizumab (TALTZ) 80 MG/ML SOSY INJECT ONE SYRINGE SUBCUTANEOUSLY EVERY 4 WEEKS. REFRIGERATE. ALLOW PEN TO REACH ROOM TEMP PRIOR TO INJECTION.  Marland Kitchen levETIRAcetam (KEPPRA) 500 MG tablet Take 1 tablet (500 mg total) by mouth 2 (two) times daily.  Marland Kitchen losartan-hydrochlorothiazide (HYZAAR) 50-12.5 MG per tablet Take 1 tablet by mouth daily.  . memantine (NAMENDA XR) 28 MG CP24 24 hr capsule Take 1 capsule (28 mg total) by mouth daily.  . metoprolol succinate (TOPROL-XL) 25 MG 24 hr  tablet TAKE (1/2) TABLET BY MOUTH DAILY.  . Multiple Vitamins-Minerals (CENTRUM SILVER PO) Take 1 tablet by mouth daily.  Marland Kitchen PARoxetine (PAXIL) 40 MG tablet Take 40 mg by mouth daily.  . tacrolimus (PROTOPIC) 0.1 % ointment as needed.   . triamcinolone lotion (KENALOG) 0.1 % Apply 1 application topically 2 (two) times daily as needed. With Silver Sulfadiazine 1%     Allergies:   Cyclobenzaprine    ROS:   Patient did not verbalize any complaints.   Prior CV studies:   The following studies were reviewed today:  Echocardiogram9/03/2013: Study Conclusions  - Left ventricle: The cavity size was normal. Wall thickness was normal. Systolic function was normal. The estimated ejection fraction was in the range of 60% to 65%. Wall motion was normal; there were no regional wall motion abnormalities. Doppler parameters are consistent with abnormal left ventricular relaxation (grade 1 diastolic dysfunction). - Right atrium: Central venous pressure: 43mm Hg (est). - Tricuspid valve: Physiologic regurgitation. - Pulmonary arteries: PA peak pressure: 58mm Hg (S). - Pericardium, extracardiac: There was no pericardial effusion. Impressions:  - Comparison to previous study June 2014. There has been normalization of LV systolic function, LVEF now 60-65%. Grade 1 diastolic dysfunction. Normal PASP, no pericardial effusion.  Labs/Other Tests and Data Reviewed:    EKG:  An ECG dated 08/10/2018 was personally reviewed today and demonstrated:  Sinus bradycardia with LVH and repolarization changes, poor R wave progression.  Recent Labs: 03/14/2019: Hemoglobin 15.7    Wt Readings from Last 3 Encounters:  08/01/19 127 lb (57.6 kg)  07/02/19 131 lb 3.2 oz (59.5 kg)  04/13/19 130 lb 9.6 oz (59.2 kg)     Objective:    Vital Signs:  BP 110/60   Pulse (!) 51   Ht 5' (1.524 m)   Wt 127 lb (57.6 kg)   BMI 24.80 kg/m    I interacted with the patient's brother during the  entire telephone encounter.  ASSESSMENT & PLAN:    1.  History of stress-induced cardiomyopathy with normalization of LVEF.  She continues on medical therapy which includes Hyzaar and Toprol-XL.  No changes were made today.  We are following conservatively at this time in the absence of obvious clinical change.  Last echocardiogram was in 2014.  2.  Essential hypertension, blood pressure is well controlled today.  Continue Hyzaar and Toprol-XL.  Keep follow-up with Dr. Manuella Ghazi.   Time:   Today, I have spent 5 minutes with the patient with telehealth technology discussing the above problems.     Medication Adjustments/Labs and Tests Ordered: Current medicines are reviewed at length with the patient today.  Concerns regarding medicines are outlined above.   Tests Ordered: No orders of the defined types were placed in this encounter.   Medication Changes: No orders of the defined types were placed in this encounter.   Follow Up:  Either In Person or Virtual 6 months.  Signed, Rozann Lesches, MD  08/01/2019 1:04 PM  [facility]

## 2019-08-01 NOTE — Patient Instructions (Addendum)
Medication Instructions:   Your physician recommends that you continue on your current medications as directed. Please refer to the Current Medication list given to you today.  Labwork:  NONE  Testing/Procedures:  NONE  Follow-Up:  Your physician recommends that you schedule a follow-up appointment in: 6 months (virtual). You will receive a reminder letter in the mail in about 4 months reminding you to call and schedule your appointment. If you don't receive this letter, please contact our office.  Any Other Special Instructions Will Be Listed Below (If Applicable).  If you need a refill on your cardiac medications before your next appointment, please call your pharmacy. 

## 2019-08-10 DIAGNOSIS — I10 Essential (primary) hypertension: Secondary | ICD-10-CM | POA: Diagnosis not present

## 2019-08-10 DIAGNOSIS — E119 Type 2 diabetes mellitus without complications: Secondary | ICD-10-CM | POA: Diagnosis not present

## 2019-08-10 DIAGNOSIS — E7800 Pure hypercholesterolemia, unspecified: Secondary | ICD-10-CM | POA: Diagnosis not present

## 2019-08-22 ENCOUNTER — Inpatient Hospital Stay (HOSPITAL_COMMUNITY)
Admission: EM | Admit: 2019-08-22 | Discharge: 2019-08-25 | DRG: 689 | Disposition: A | Discharge status: Home Health | Payer: Medicare Other | Attending: Internal Medicine | Admitting: Internal Medicine

## 2019-08-22 ENCOUNTER — Encounter (HOSPITAL_COMMUNITY): Payer: Self-pay

## 2019-08-22 ENCOUNTER — Emergency Department (HOSPITAL_COMMUNITY): Payer: Medicare Other

## 2019-08-22 ENCOUNTER — Other Ambulatory Visit: Payer: Self-pay

## 2019-08-22 DIAGNOSIS — R4182 Altered mental status, unspecified: Secondary | ICD-10-CM | POA: Diagnosis not present

## 2019-08-22 DIAGNOSIS — Z79899 Other long term (current) drug therapy: Secondary | ICD-10-CM | POA: Diagnosis not present

## 2019-08-22 DIAGNOSIS — R270 Ataxia, unspecified: Secondary | ICD-10-CM | POA: Diagnosis not present

## 2019-08-22 DIAGNOSIS — I5181 Takotsubo syndrome: Secondary | ICD-10-CM | POA: Diagnosis present

## 2019-08-22 DIAGNOSIS — F419 Anxiety disorder, unspecified: Secondary | ICD-10-CM | POA: Diagnosis present

## 2019-08-22 DIAGNOSIS — Z20822 Contact with and (suspected) exposure to covid-19: Secondary | ICD-10-CM | POA: Diagnosis present

## 2019-08-22 DIAGNOSIS — G40909 Epilepsy, unspecified, not intractable, without status epilepticus: Secondary | ICD-10-CM | POA: Diagnosis present

## 2019-08-22 DIAGNOSIS — Z03818 Encounter for observation for suspected exposure to other biological agents ruled out: Secondary | ICD-10-CM | POA: Diagnosis not present

## 2019-08-22 DIAGNOSIS — G309 Alzheimer's disease, unspecified: Secondary | ICD-10-CM | POA: Diagnosis present

## 2019-08-22 DIAGNOSIS — F02818 Dementia in other diseases classified elsewhere, unspecified severity, with other behavioral disturbance: Secondary | ICD-10-CM

## 2019-08-22 DIAGNOSIS — R55 Syncope and collapse: Secondary | ICD-10-CM

## 2019-08-22 DIAGNOSIS — F329 Major depressive disorder, single episode, unspecified: Secondary | ICD-10-CM | POA: Diagnosis present

## 2019-08-22 DIAGNOSIS — I2510 Atherosclerotic heart disease of native coronary artery without angina pectoris: Secondary | ICD-10-CM | POA: Diagnosis present

## 2019-08-22 DIAGNOSIS — R0689 Other abnormalities of breathing: Secondary | ICD-10-CM | POA: Diagnosis not present

## 2019-08-22 DIAGNOSIS — F0281 Dementia in other diseases classified elsewhere with behavioral disturbance: Secondary | ICD-10-CM | POA: Diagnosis not present

## 2019-08-22 DIAGNOSIS — N390 Urinary tract infection, site not specified: Principal | ICD-10-CM | POA: Diagnosis present

## 2019-08-22 DIAGNOSIS — L4050 Arthropathic psoriasis, unspecified: Secondary | ICD-10-CM | POA: Diagnosis present

## 2019-08-22 DIAGNOSIS — E785 Hyperlipidemia, unspecified: Secondary | ICD-10-CM | POA: Diagnosis present

## 2019-08-22 DIAGNOSIS — G9341 Metabolic encephalopathy: Secondary | ICD-10-CM | POA: Diagnosis present

## 2019-08-22 DIAGNOSIS — I10 Essential (primary) hypertension: Secondary | ICD-10-CM | POA: Diagnosis not present

## 2019-08-22 DIAGNOSIS — R0602 Shortness of breath: Secondary | ICD-10-CM | POA: Diagnosis not present

## 2019-08-22 DIAGNOSIS — Z8669 Personal history of other diseases of the nervous system and sense organs: Secondary | ICD-10-CM

## 2019-08-22 DIAGNOSIS — R569 Unspecified convulsions: Secondary | ICD-10-CM | POA: Diagnosis not present

## 2019-08-22 LAB — URINALYSIS, ROUTINE W REFLEX MICROSCOPIC
Bilirubin Urine: NEGATIVE
Glucose, UA: NEGATIVE mg/dL
Hgb urine dipstick: NEGATIVE
Ketones, ur: NEGATIVE mg/dL
Nitrite: POSITIVE — AB
Protein, ur: NEGATIVE mg/dL
Specific Gravity, Urine: 1.014 (ref 1.005–1.030)
pH: 6 (ref 5.0–8.0)

## 2019-08-22 LAB — BASIC METABOLIC PANEL
Anion gap: 7 (ref 5–15)
BUN: 24 mg/dL — ABNORMAL HIGH (ref 8–23)
CO2: 32 mmol/L (ref 22–32)
Calcium: 9.4 mg/dL (ref 8.9–10.3)
Chloride: 101 mmol/L (ref 98–111)
Creatinine, Ser: 0.64 mg/dL (ref 0.44–1.00)
GFR calc Af Amer: >60 mL/min (ref >60)
GFR calc non Af Amer: >60 mL/min (ref >60)
Glucose, Bld: 89 mg/dL (ref 70–99)
Potassium: 3.7 mmol/L (ref 3.5–5.1)
Sodium: 140 mmol/L (ref 135–145)

## 2019-08-22 LAB — CBC
HCT: 46.7 % — ABNORMAL HIGH (ref 36.0–46.0)
Hemoglobin: 15 g/dL (ref 12.0–15.0)
MCH: 32.9 pg (ref 26.0–34.0)
MCHC: 32.1 g/dL (ref 30.0–36.0)
MCV: 102.4 fL — ABNORMAL HIGH (ref 80.0–100.0)
Platelets: 171 10*3/uL (ref 150–400)
RBC: 4.56 MIL/uL (ref 3.87–5.11)
RDW: 12.1 % (ref 11.5–15.5)
WBC: 7.3 10*3/uL (ref 4.0–10.5)
nRBC: 0 % (ref 0.0–0.2)

## 2019-08-22 LAB — TROPONIN I (HIGH SENSITIVITY)
Troponin I (High Sensitivity): 5 ng/L (ref <18)
Troponin I (High Sensitivity): 6 ng/L (ref <18)

## 2019-08-22 LAB — CBG MONITORING, ED: Glucose-Capillary: 86 mg/dL (ref 70–99)

## 2019-08-22 NOTE — ED Triage Notes (Signed)
Pt brought to ED via [facility] for syncopal episode. Pt was found by bed and nightstand. EMS arrived and was starting to come around but unable to answer some of her normal questions she normally can answer but pt with hx dementia. CBG 98, denies hitting head. Pt not on blood thinners.

## 2019-08-22 NOTE — ED Provider Notes (Signed)
[facility] EMERGENCY DEPARTMENT Provider Note   CSN: [id number] Arrival date & time: 08/22/19  1859     History Chief Complaint  Patient presents with  . Near Syncope    Jennifer [name] is a 79 y.o. female.  According to the husband the patient was on the floor and he was trying to help her up and then she passed out.  The son stated that she was not breathing and her heart was not beating so they did CPR on her.  When the paramedics arrived she was awake  The history is provided by a relative. No language interpreter was used.  Near Syncope This is a new problem. The current episode started less than 1 hour ago. The problem occurs rarely. The problem has been resolved. Pertinent negatives include no chest pain. Nothing aggravates the symptoms. Nothing relieves the symptoms. She has tried nothing for the symptoms.       Past Medical History:  Diagnosis Date  . Anxiety   . Borderline diabetes   . Coronary atherosclerosis of native coronary artery    Nonobstructive at catheterization June 2014  . DDD (degenerative disc disease), lumbar   . Depression   . Essential hypertension   . Hyperlipidemia   . Nephrolithiasis   . Psoriatic arthritis (HCC)   . Takotsubo cardiomyopathy    LVEF 60-65% September 2014    Patient Active Problem List   Diagnosis Date Noted  . Rectal bleeding 03/14/2019  . Altered awareness, transient 01/06/2015  . Memory loss 01/29/2014  . Coronary atherosclerosis of native coronary artery 03/15/2013  . HLD (hyperlipidemia) 01/11/2013  . Essential hypertension, benign 01/11/2013  . Takotsubo syndrome 12/19/2012  . Nephrolithiasis 12/16/2012  . Type II or unspecified type diabetes mellitus without mention of complication, not stated as uncontrolled 12/16/2012  . Other and unspecified hyperlipidemia 12/16/2012  . Psoriatic arthritis (HCC) 12/16/2012    Past Surgical History:  Procedure Laterality Date  . APPENDECTOMY    . BACK SURGERY  2014  .  CHOLECYSTECTOMY    . LEFT HEART CATHETERIZATION WITH CORONARY ANGIOGRAM N/A 12/18/2012   Procedure: LEFT HEART CATHETERIZATION WITH CORONARY ANGIOGRAM;  Surgeon: Tonny Bollman, MD;  Location: [facility] CATH LAB;  Service: Cardiovascular;  Laterality: N/A;     OB History   No obstetric history on file.     Family History  Problem Relation Age of Onset  . CAD Father   . CAD Mother   . Hypertension Mother   . Breast cancer Mother   . Hypertension Brother         X 2  . Colon polyps Brother        thinks he is on 10 year surveillance program   . Cancer Maternal Grandmother        breast cancer   . Colon cancer Neg Hx     Social History   Tobacco Use  . Smoking status: Never Smoker  . Smokeless tobacco: Never Used  Substance Use Topics  . Alcohol use: No    Alcohol/week: 0.0 standard drinks  . Drug use: No    Home Medications Prior to Admission medications   Medication Sig Start Date End Date Taking? Authorizing Provider  aspirin 81 MG EC tablet Take 1 tablet (81 mg total) by mouth daily. Swallow whole. Patient taking differently: Take 81 mg by mouth every evening. Swallow whole. 12/19/12  Yes Calvert Cantor, MD  atorvastatin (LIPITOR) 10 MG tablet TAKE 1 TABLET AT 6:00 P.M. Patient taking differently:  Take 10 mg by mouth every evening.  04/14/15  Yes Satira Sark, MD  Biotin 5000 MCG CAPS Take 10,000 mcg by mouth every evening.    Yes [provider]  busPIRone (BUSPAR) 10 MG tablet Take 10 mg by mouth 2 (two) times daily.  03/02/19  Yes [provider]  calcium carbonate (OSCAL) 1500 (600 Ca) MG TABS tablet Take 600 mg of elemental calcium by mouth 2 (two) times daily with a meal.   Yes [provider]  clobetasol cream (TEMOVATE) 6.21 % Apply 1 application topically 2 (two) times daily as needed (for eczema).    Yes [provider]  clonazePAM (KLONOPIN) 0.5 MG tablet Take 1 tablet by mouth every evening.  11/30/18  Yes [provider]  fish oil-omega-3 fatty acids 1000 MG capsule Take 1 g by mouth every evening.    Yes [provider]  hydroxypropyl methylcellulose / hypromellose (ISOPTO TEARS / GONIOVISC) 2.5 % ophthalmic solution Place 1 drop into the left eye daily as needed for dry eyes.    Yes [provider]  Ixekizumab (TALTZ) 80 MG/ML SOSY Inject 1 Syringe into the skin every 28 (twenty-eight) days.  05/16/18  Yes [provider]  levETIRAcetam (KEPPRA) 500 MG tablet Take 1 tablet (500 mg total) by mouth 2 (two) times daily. 04/12/17  Yes Jaffe, Adam R, DO  losartan-hydrochlorothiazide (HYZAAR) 50-12.5 MG per tablet Take 1 tablet by mouth every evening.    Yes [provider]  memantine (NAMENDA XR) 28 MG CP24 24 hr capsule Take 1 capsule (28 mg total) by mouth daily. Patient taking differently: Take 28 mg by mouth every evening.  04/12/17  Yes Jaffe, Adam R, DO  metoprolol succinate (TOPROL-XL) 25 MG 24 hr tablet TAKE (1/2) TABLET BY MOUTH DAILY. Patient taking differently: Take 12.5 mg by mouth every evening.  07/30/15  Yes Satira Sark, MD  Multiple Vitamins-Minerals (CENTRUM SILVER PO) Take 1 tablet by mouth every evening.    Yes [provider]  NONFORMULARY OR COMPOUNDED ITEM Apply 1 application topically 2 (two) times daily as needed (for inverse psoriasis). Triamcinolone Cream 1%: Silver Sulfadiazine   Yes [provider]  PARoxetine (PAXIL) 40 MG tablet Take 40 mg by mouth every evening.    Yes [provider]  triamcinolone lotion (KENALOG) 0.1 % Apply 1 application topically 2 (two) times daily as needed. With Silver Sulfadiazine 1%   Yes [provider]  polyethylene glycol-electrolytes (NULYTELY) 420 g solution As directed Patient not taking: Reported on 08/22/2019 07/02/19   Rourk, Cristopher Estimable, MD  polyethylene glycol-electrolytes (TRILYTE) 420 g solution Take 4,000 mLs by mouth as directed. Patient not taking: Reported on 08/01/2019  07/31/19   Daneil Dolin, MD    Allergies    Cyclobenzaprine  Review of Systems   Review of Systems  Unable to perform ROS: Dementia  Cardiovascular: Positive for near-syncope. Negative for chest pain.    Physical Exam Updated Vital Signs BP (!) 126/55   Pulse 61   Temp 97.9 F (36.6 C) (Oral)   Resp 14   Ht 5' (1.524 m)   Wt 57.6 kg   SpO2 90%   BMI 24.80 kg/m   Physical Exam Vitals and nursing note reviewed.  Constitutional:      Appearance: She is well-developed.  HENT:     Head: Normocephalic.     Nose: Nose normal.  Eyes:     General: No scleral icterus.    Conjunctiva/sclera:  Conjunctivae normal.  Neck:     Thyroid: No thyromegaly.  Cardiovascular:     Rate and Rhythm: Normal rate and regular rhythm.     Heart sounds: No murmur. No friction rub. No gallop.   Pulmonary:     Breath sounds: No stridor. No wheezing or rales.  Chest:     Chest wall: No tenderness.  Abdominal:     General: There is no distension.     Tenderness: There is no abdominal tenderness. There is no rebound.  Musculoskeletal:        General: Normal range of motion.     Cervical back: Neck supple.  Lymphadenopathy:     Cervical: No cervical adenopathy.  Skin:    Findings: No erythema or rash.  Neurological:     Mental Status: She is alert.     Motor: No abnormal muscle tone.     Coordination: Coordination normal.     Comments: Patient alert but only oriented to person     ED Results / Procedures / Treatments   Labs (all labs ordered are listed, but only abnormal results are displayed) Labs Reviewed  BASIC METABOLIC PANEL - Abnormal; Notable for the following components:      Result Value   BUN 24 (*)    All other components within normal limits  CBC - Abnormal; Notable for the following components:   HCT 46.7 (*)    MCV 102.4 (*)    All other components within normal limits  URINALYSIS, ROUTINE W REFLEX MICROSCOPIC - Abnormal; Notable for the following components:    Nitrite POSITIVE (*)    Leukocytes,Ua SMALL (*)    Bacteria, UA RARE (*)    All other components within normal limits  SARS CORONAVIRUS 2 (TAT 6-24 HRS)  CBG MONITORING, ED  TROPONIN I (HIGH SENSITIVITY)  TROPONIN I (HIGH SENSITIVITY)    EKG EKG Interpretation  Date/Time:  Wednesday August 22 2019 19:15:28 EST Ventricular Rate:  61 PR Interval:    QRS Duration: 113 QT Interval:  449 QTC Calculation: 453 R Axis:   -45 Text Interpretation: Sinus rhythm LVH with IVCD, LAD and secondary repol abnrm Confirmed by Bethann Berkshire [phone]) on 08/22/2019 8:05:36 PM Also confirmed by Bethann Berkshire [phone])  on 08/22/2019 10:36:21 PM   Radiology CT Head Wo Contrast  Result Date: 08/22/2019 CLINICAL DATA:  79 year old female with ataxia. EXAM: CT HEAD WITHOUT CONTRAST TECHNIQUE: Contiguous axial images were obtained from the base of the skull through the vertex without intravenous contrast. COMPARISON:  Head CT report dated 08/24/2016. The images are not available for direct comparison. FINDINGS: Brain: Mild age-related atrophy and chronic microvascular ischemic changes. Faint hypodense focus in the anterior horn of the left internal capsule (series 2, image 13), likely chronic. There is no acute intracranial hemorrhage. No mass effect or midline shift. No extra-axial fluid collection. Vascular: No hyperdense vessel or unexpected calcification. Skull: Normal. Negative for fracture or focal lesion. Sinuses/Orbits: No acute finding. Other: None IMPRESSION: 1. No acute intracranial pathology. 2. Mild age-related atrophy and chronic microvascular ischemic changes. Electronically Signed   By: Elgie Collard M.D.   On: 08/22/2019 20:48   DG Chest Port 1 View  Result Date: 08/22/2019 CLINICAL DATA:  Syncopal episode, shortness of breath, Takotsubo cardiomyopathy, hypertension, coronary artery disease EXAM: PORTABLE CHEST 1 VIEW COMPARISON:  Portable exam 2026 hours compared to 12/08/2004 FINDINGS: Normal heart  size, mediastinal contours, and pulmonary vascularity. Atherosclerotic calcification aorta. Lungs clear. No infiltrate, pleural effusion or pneumothorax. Bones  appear demineralized. IMPRESSION: No acute abnormalities. Electronically Signed   By: Ulyses Southward M.D.   On: 08/22/2019 20:40    Procedures Procedures (including critical care time)  Medications Ordered in ED Medications - No data to display  ED Course  I have reviewed the triage vital signs and the nursing notes.  Pertinent labs & imaging results that were available during my care of the patient were reviewed by me and considered in my medical decision making (see chart for details).    MDM Rules/Calculators/A&P                      Labs x-ray EKG are all unremarkable.  Patient has syncopal episode and CPR was started by family member she will be observed in the hospital Final Clinical Impression(s) / ED Diagnoses Final diagnoses:  Syncope and collapse    Rx / DC Orders ED Discharge Orders    None       Bethann Berkshire, MD 08/22/19 2313

## 2019-08-23 ENCOUNTER — Inpatient Hospital Stay (HOSPITAL_COMMUNITY): Payer: Medicare Other

## 2019-08-23 DIAGNOSIS — I10 Essential (primary) hypertension: Secondary | ICD-10-CM | POA: Diagnosis present

## 2019-08-23 DIAGNOSIS — G9341 Metabolic encephalopathy: Secondary | ICD-10-CM | POA: Diagnosis present

## 2019-08-23 DIAGNOSIS — R569 Unspecified convulsions: Secondary | ICD-10-CM

## 2019-08-23 DIAGNOSIS — G309 Alzheimer's disease, unspecified: Secondary | ICD-10-CM | POA: Diagnosis present

## 2019-08-23 DIAGNOSIS — R55 Syncope and collapse: Secondary | ICD-10-CM | POA: Diagnosis present

## 2019-08-23 DIAGNOSIS — E785 Hyperlipidemia, unspecified: Secondary | ICD-10-CM | POA: Diagnosis present

## 2019-08-23 DIAGNOSIS — F0281 Dementia in other diseases classified elsewhere with behavioral disturbance: Secondary | ICD-10-CM | POA: Diagnosis present

## 2019-08-23 DIAGNOSIS — R270 Ataxia, unspecified: Secondary | ICD-10-CM | POA: Diagnosis not present

## 2019-08-23 DIAGNOSIS — I2510 Atherosclerotic heart disease of native coronary artery without angina pectoris: Secondary | ICD-10-CM | POA: Diagnosis present

## 2019-08-23 DIAGNOSIS — L4050 Arthropathic psoriasis, unspecified: Secondary | ICD-10-CM | POA: Diagnosis present

## 2019-08-23 DIAGNOSIS — G40909 Epilepsy, unspecified, not intractable, without status epilepticus: Secondary | ICD-10-CM | POA: Diagnosis present

## 2019-08-23 DIAGNOSIS — Z20822 Contact with and (suspected) exposure to covid-19: Secondary | ICD-10-CM | POA: Diagnosis present

## 2019-08-23 DIAGNOSIS — Z79899 Other long term (current) drug therapy: Secondary | ICD-10-CM | POA: Diagnosis not present

## 2019-08-23 DIAGNOSIS — F329 Major depressive disorder, single episode, unspecified: Secondary | ICD-10-CM | POA: Diagnosis present

## 2019-08-23 DIAGNOSIS — R4182 Altered mental status, unspecified: Secondary | ICD-10-CM | POA: Diagnosis not present

## 2019-08-23 DIAGNOSIS — I5181 Takotsubo syndrome: Secondary | ICD-10-CM | POA: Diagnosis present

## 2019-08-23 DIAGNOSIS — F419 Anxiety disorder, unspecified: Secondary | ICD-10-CM | POA: Diagnosis present

## 2019-08-23 DIAGNOSIS — N390 Urinary tract infection, site not specified: Secondary | ICD-10-CM | POA: Diagnosis present

## 2019-08-23 LAB — COMPREHENSIVE METABOLIC PANEL
ALT: 28 U/L (ref 0–44)
AST: 23 U/L (ref 15–41)
Albumin: 3.2 g/dL — ABNORMAL LOW (ref 3.5–5.0)
Alkaline Phosphatase: 43 U/L (ref 38–126)
Anion gap: 8 (ref 5–15)
BUN: 24 mg/dL — ABNORMAL HIGH (ref 8–23)
CO2: 29 mmol/L (ref 22–32)
Calcium: 9.2 mg/dL (ref 8.9–10.3)
Chloride: 102 mmol/L (ref 98–111)
Creatinine, Ser: 0.68 mg/dL (ref 0.44–1.00)
GFR calc Af Amer: >60 mL/min (ref >60)
GFR calc non Af Amer: >60 mL/min (ref >60)
Glucose, Bld: 105 mg/dL — ABNORMAL HIGH (ref 70–99)
Potassium: 3.4 mmol/L — ABNORMAL LOW (ref 3.5–5.1)
Sodium: 139 mmol/L (ref 135–145)
Total Bilirubin: 1 mg/dL (ref 0.3–1.2)
Total Protein: 5.9 g/dL — ABNORMAL LOW (ref 6.5–8.1)

## 2019-08-23 LAB — CBC
HCT: 43.5 % (ref 36.0–46.0)
Hemoglobin: 14 g/dL (ref 12.0–15.0)
MCH: 32.8 pg (ref 26.0–34.0)
MCHC: 32.2 g/dL (ref 30.0–36.0)
MCV: 101.9 fL — ABNORMAL HIGH (ref 80.0–100.0)
Platelets: 160 10*3/uL (ref 150–400)
RBC: 4.27 MIL/uL (ref 3.87–5.11)
RDW: 12 % (ref 11.5–15.5)
WBC: 8.6 10*3/uL (ref 4.0–10.5)
nRBC: 0 % (ref 0.0–0.2)

## 2019-08-23 LAB — SARS CORONAVIRUS 2 (TAT 6-24 HRS): SARS Coronavirus 2: NEGATIVE

## 2019-08-23 LAB — ECHOCARDIOGRAM COMPLETE
Height: 60 in
Weight: 2031.76 oz

## 2019-08-23 LAB — TSH: TSH: 2.385 u[IU]/mL (ref 0.350–4.500)

## 2019-08-23 LAB — VITAMIN B12: Vitamin B-12: 496 pg/mL (ref 180–914)

## 2019-08-23 LAB — TROPONIN I (HIGH SENSITIVITY)
Troponin I (High Sensitivity): 8 ng/L (ref <18)
Troponin I (High Sensitivity): 8 ng/L (ref <18)

## 2019-08-23 MED ORDER — ENOXAPARIN SODIUM 40 MG/0.4ML ~~LOC~~ SOLN
40.0000 mg | SUBCUTANEOUS | Status: DC
  Administered 2019-08-23 – 2019-08-25 (×3): 40 mg via SUBCUTANEOUS
  Filled 2019-08-23 (×3): qty 0.4

## 2019-08-23 MED ORDER — ATORVASTATIN CALCIUM 10 MG PO TABS
10.0000 mg | ORAL_TABLET | Freq: Every evening | ORAL | Status: DC
  Administered 2019-08-23 – 2019-08-24 (×2): 10 mg via ORAL
  Filled 2019-08-23 (×2): qty 1

## 2019-08-23 MED ORDER — ASPIRIN EC 81 MG PO TBEC
81.0000 mg | DELAYED_RELEASE_TABLET | Freq: Every evening | ORAL | Status: DC
  Administered 2019-08-23 – 2019-08-24 (×2): 81 mg via ORAL
  Filled 2019-08-23 (×2): qty 1

## 2019-08-23 MED ORDER — METOPROLOL SUCCINATE ER 25 MG PO TB24
12.5000 mg | ORAL_TABLET | Freq: Every evening | ORAL | Status: DC
  Administered 2019-08-23 – 2019-08-24 (×2): 12.5 mg via ORAL
  Filled 2019-08-23 (×2): qty 1

## 2019-08-23 MED ORDER — CLONAZEPAM 0.5 MG PO TABS
0.5000 mg | ORAL_TABLET | Freq: Every evening | ORAL | Status: DC
  Administered 2019-08-23 – 2019-08-24 (×2): 0.5 mg via ORAL
  Filled 2019-08-23 (×2): qty 1

## 2019-08-23 MED ORDER — BUSPIRONE HCL 5 MG PO TABS
10.0000 mg | ORAL_TABLET | Freq: Two times a day (BID) | ORAL | Status: DC
  Administered 2019-08-23 – 2019-08-25 (×4): 10 mg via ORAL
  Filled 2019-08-23 (×5): qty 2

## 2019-08-23 MED ORDER — ADULT MULTIVITAMIN W/MINERALS CH
1.0000 | ORAL_TABLET | Freq: Every day | ORAL | Status: DC
  Administered 2019-08-23 – 2019-08-25 (×3): 1 via ORAL
  Filled 2019-08-23 (×3): qty 1

## 2019-08-23 MED ORDER — MEMANTINE HCL ER 7 MG PO CP24
28.0000 mg | ORAL_CAPSULE | Freq: Every evening | ORAL | Status: DC
  Administered 2019-08-23 – 2019-08-24 (×2): 28 mg via ORAL
  Filled 2019-08-23 (×2): qty 4
  Filled 2019-08-23: qty 1
  Filled 2019-08-23: qty 4

## 2019-08-23 MED ORDER — GADOBUTROL 1 MMOL/ML IV SOLN
7.0000 mL | Freq: Once | INTRAVENOUS | Status: AC | PRN
  Administered 2019-08-23: 7 mL via INTRAVENOUS

## 2019-08-23 MED ORDER — SODIUM CHLORIDE 0.9 % IV SOLN
1.0000 g | INTRAVENOUS | Status: DC
  Administered 2019-08-23 – 2019-08-25 (×3): 1 g via INTRAVENOUS
  Filled 2019-08-23 (×4): qty 10

## 2019-08-23 MED ORDER — OMEGA-3-ACID ETHYL ESTERS 1 G PO CAPS
1.0000 g | ORAL_CAPSULE | Freq: Every evening | ORAL | Status: DC
  Administered 2019-08-23 – 2019-08-24 (×2): 1 g via ORAL
  Filled 2019-08-23 (×2): qty 1

## 2019-08-23 MED ORDER — PAROXETINE HCL 20 MG PO TABS
40.0000 mg | ORAL_TABLET | Freq: Every evening | ORAL | Status: DC
  Administered 2019-08-23 – 2019-08-24 (×2): 40 mg via ORAL
  Filled 2019-08-23 (×2): qty 2

## 2019-08-23 MED ORDER — LEVETIRACETAM 500 MG PO TABS
500.0000 mg | ORAL_TABLET | Freq: Two times a day (BID) | ORAL | Status: DC
  Administered 2019-08-23 – 2019-08-25 (×4): 500 mg via ORAL
  Filled 2019-08-23 (×5): qty 1

## 2019-08-23 MED ORDER — OMEGA-3 FATTY ACIDS 1000 MG PO CAPS: 1.0000 g | ORAL_CAPSULE | Freq: Every evening | ORAL | Status: DC

## 2019-08-23 MED ORDER — HALOPERIDOL 2 MG PO TABS
2.0000 mg | ORAL_TABLET | Freq: Once | ORAL | Status: AC
  Administered 2019-08-23: 2 mg via ORAL
  Filled 2019-08-23: qty 1

## 2019-08-23 MED ORDER — ENSURE ENLIVE PO LIQD
237.0000 mL | Freq: Two times a day (BID) | ORAL | Status: DC
  Administered 2019-08-23 – 2019-08-25 (×4): 237 mL via ORAL

## 2019-08-23 MED ORDER — SODIUM CHLORIDE 0.9 % IV SOLN: INTRAVENOUS | Status: DC

## 2019-08-23 NOTE — H&P (Signed)
TRH H&P    Patient Demographics:    Jennifer [name], is a 79 y.o. female  MRN: [id number]  DOB - [birth year]  Admit Date - 08/22/2019  Referring MD/NP/PA: Dr. Estell Harpin  Outpatient Primary MD for the patient is Kirstie Peri, MD  Patient coming from: Home  Chief complaint-    HPI:    Jennifer [name]  is a 79 y.o. female, with medical history of stress-induced cardiomyopathy with normalization of EF, hypertension, hyperlipidemia, depression, seizure disorder was brought to the hospital after patient was on floor and passed out.  Patient son stated that she was not breathing and her heart was not beating so they did do CPR on her for few seconds.  After that patient became alert.  By the time EMS arrived patient was awake.  She was brought to the hospital.  Cardiac enzymes x 2 were negative, CT head was unremarkable.   Patient denies any chest pain at this time. Denies nausea vomiting or diarrhea. She was found to have abnormal UA.    Review of systems:    In addition to the HPI above,   All other systems reviewed and are negative.    Past History of the following :    Past Medical History:  Diagnosis Date  . Anxiety   . Borderline diabetes   . Coronary atherosclerosis of native coronary artery    Nonobstructive at catheterization June 2014  . DDD (degenerative disc disease), lumbar   . Depression   . Essential hypertension   . Hyperlipidemia   . Nephrolithiasis   . Psoriatic arthritis (HCC)   . Takotsubo cardiomyopathy    LVEF 60-65% September 2014      Past Surgical History:  Procedure Laterality Date  . APPENDECTOMY    . BACK SURGERY  2014  . CHOLECYSTECTOMY    . LEFT HEART CATHETERIZATION WITH CORONARY ANGIOGRAM N/A 12/18/2012   Procedure: LEFT HEART CATHETERIZATION WITH CORONARY ANGIOGRAM;  Surgeon: Tonny Bollman, MD;  Location: [facility] CATH [facility];  Service: Cardiovascular;  Laterality: N/A;       Social History:      Social History   Tobacco Use  . Smoking status: Never Smoker  . Smokeless tobacco: Never Used  Substance Use Topics  . Alcohol use: No    Alcohol/week: 0.0 standard drinks       Family History :     Family History  Problem Relation Age of Onset  . CAD Father   . CAD Mother   . Hypertension Mother   . Breast cancer Mother   . Hypertension Brother         X 2  . Colon polyps Brother        thinks he is on 10 year surveillance program   . Cancer Maternal Grandmother        breast cancer   . Colon cancer Neg Hx       Home Medications:   Prior to Admission medications   Medication Sig Start Date End Date Taking? Authorizing Provider  aspirin 81 MG EC tablet  Take 1 tablet (81 mg total) by mouth daily. Swallow whole. Patient taking differently: Take 81 mg by mouth every evening. Swallow whole. 12/19/12  Yes Calvert Cantor, MD  atorvastatin (LIPITOR) 10 MG tablet TAKE 1 TABLET AT 6:00 P.M. Patient taking differently: Take 10 mg by mouth every evening.  04/14/15  Yes Jonelle Sidle, MD  Biotin 5000 MCG CAPS Take 10,000 mcg by mouth every evening.    Yes [provider]  busPIRone (BUSPAR) 10 MG tablet Take 10 mg by mouth 2 (two) times daily.  03/02/19  Yes [provider]  calcium carbonate (OSCAL) 1500 (600 Ca) MG TABS tablet Take 600 mg of elemental calcium by mouth 2 (two) times daily with a meal.   Yes [provider]  clobetasol cream (TEMOVATE) 0.05 % Apply 1 application topically 2 (two) times daily as needed (for eczema).    Yes [provider]  clonazePAM (KLONOPIN) 0.5 MG tablet Take 1 tablet by mouth every evening.  11/30/18  Yes [provider]  fish oil-omega-3 fatty acids 1000 MG capsule Take 1 g by mouth every evening.    Yes [provider]  hydroxypropyl methylcellulose / hypromellose (ISOPTO TEARS / GONIOVISC) 2.5 % ophthalmic solution Place 1 drop into the left eye daily as  needed for dry eyes.    Yes [provider]  Ixekizumab (TALTZ) 80 MG/ML SOSY Inject 1 Syringe into the skin every 28 (twenty-eight) days.  05/16/18  Yes [provider]  levETIRAcetam (KEPPRA) 500 MG tablet Take 1 tablet (500 mg total) by mouth 2 (two) times daily. 04/12/17  Yes Jaffe, Adam R, DO  losartan-hydrochlorothiazide (HYZAAR) 50-12.5 MG per tablet Take 1 tablet by mouth every evening.    Yes [provider]  memantine (NAMENDA XR) 28 MG CP24 24 hr capsule Take 1 capsule (28 mg total) by mouth daily. Patient taking differently: Take 28 mg by mouth every evening.  04/12/17  Yes Jaffe, Adam R, DO  metoprolol succinate (TOPROL-XL) 25 MG 24 hr tablet TAKE (1/2) TABLET BY MOUTH DAILY. Patient taking differently: Take 12.5 mg by mouth every evening.  07/30/15  Yes Jonelle Sidle, MD  Multiple Vitamins-Minerals (CENTRUM SILVER PO) Take 1 tablet by mouth every evening.    Yes [provider]  NONFORMULARY OR COMPOUNDED ITEM Apply 1 application topically 2 (two) times daily as needed (for inverse psoriasis). Triamcinolone Cream 1%: Silver Sulfadiazine   Yes [provider]  PARoxetine (PAXIL) 40 MG tablet Take 40 mg by mouth every evening.    Yes [provider]  triamcinolone lotion (KENALOG) 0.1 % Apply 1 application topically 2 (two) times daily as needed. With Silver Sulfadiazine 1%   Yes [provider]  polyethylene glycol-electrolytes (NULYTELY) 420 g solution As directed Patient not taking: Reported on 08/22/2019 07/02/19   Rourk, Gerrit Friends, MD  polyethylene glycol-electrolytes (TRILYTE) 420 g solution Take 4,000 mLs by mouth as directed. Patient not taking: Reported on 08/01/2019 07/31/19   Corbin Ade, MD     Allergies:     Allergies  Allergen Reactions  . Cyclobenzaprine     TOO STRONG  CAN'T USE AND WORK     Physical Exam:   Vitals  Blood pressure (!) 126/55, pulse 61, temperature 97.9 F (36.6 C), temperature  source Oral, resp. rate 14, height 5' (1.524 m), weight 57.6 kg, SpO2 90 %.  1.  General: Appears in no acute distress  2. Psychiatric: Alert, oriented x3  3. Neurologic: Cranial nerve  II through XII grossly intact, no focal deficit noted  4. HEENMT:  Atraumatic normocephalic, extraocular muscles are intact  5. Respiratory : Clear to auscultation bilaterally, no wheezing or crackles auscultated  6. Cardiovascular : S1-S2, regular, no murmur auscultated  7. Gastrointestinal:  Abdomen is soft, nontender, no organomegaly     Data Review:    CBC Recent Labs  Lab 08/22/19 2000  WBC 7.3  HGB 15.0  HCT 46.7*  PLT 171  MCV 102.4*  MCH 32.9  MCHC 32.1  RDW 12.1   ------------------------------------------------------------------------------------------------------------------  Results for orders placed or performed during the hospital encounter of 08/22/19 (from the past 48 hour(s))  CBG monitoring, ED     Status: None   Collection Time: 08/22/19  7:28 PM  Result Value Ref Range   Glucose-Capillary 86 70 - 99 mg/dL    Comment: Glucose reference range applies only to samples taken after fasting for at least 8 hours.  Basic metabolic panel     Status: Abnormal   Collection Time: 08/22/19  8:00 PM  Result Value Ref Range   Sodium 140 135 - 145 mmol/L   Potassium 3.7 3.5 - 5.1 mmol/L   Chloride 101 98 - 111 mmol/L   CO2 32 22 - 32 mmol/L   Glucose, Bld 89 70 - 99 mg/dL    Comment: Glucose reference range applies only to samples taken after fasting for at least 8 hours.   BUN 24 (H) 8 - 23 mg/dL   Creatinine, Ser 0.17 0.44 - 1.00 mg/dL   Calcium 9.4 8.9 - 49.4 mg/dL   GFR calc non Af Amer >60 >60 mL/min   GFR calc Af Amer >60 >60 mL/min   Anion gap 7 5 - 15    Comment: Performed at [facility], [address], [place], [place] [address]  CBC     Status: Abnormal   Collection Time: 08/22/19  8:00 PM  Result Value Ref Range   WBC 7.3 4.0 - 10.5 K/uL   RBC 4.56 3.87  - 5.11 MIL/uL   Hemoglobin 15.0 12.0 - 15.0 g/dL   HCT 91.6 (H) 38.4 - 66.5 %   MCV 102.4 (H) 80.0 - 100.0 fL   MCH 32.9 26.0 - 34.0 pg   MCHC 32.1 30.0 - 36.0 g/dL   RDW 99.3 57.0 - 17.7 %   Platelets 171 150 - 400 K/uL   nRBC 0.0 0.0 - 0.2 %    Comment: Performed at [facility], [address] [place], [place] [address]  Troponin I (High Sensitivity)     Status: None   Collection Time: 08/22/19  8:00 PM  Result Value Ref Range   Troponin I (High Sensitivity) 5 <18 ng/L    Comment: (NOTE) Elevated high sensitivity troponin I (hsTnI) values and significant  changes across serial measurements may suggest ACS but many other  chronic and acute conditions are known to elevate hsTnI results.  Refer to the "Links" section for chest pain algorithms and additional  guidance. Performed at [facility], [address], [place], [place] [address]   Urinalysis, Routine w reflex microscopic     Status: Abnormal   Collection Time: 08/22/19  9:39 PM  Result Value Ref Range   Color, Urine YELLOW YELLOW   APPearance CLEAR CLEAR   Specific Gravity, Urine 1.014 1.005 - 1.030   pH 6.0 5.0 - 8.0   Glucose, UA NEGATIVE NEGATIVE mg/dL   Hgb urine dipstick NEGATIVE NEGATIVE   Bilirubin Urine NEGATIVE NEGATIVE  Ketones, ur NEGATIVE NEGATIVE mg/dL   Protein, ur NEGATIVE NEGATIVE mg/dL   Nitrite POSITIVE (A) NEGATIVE   Leukocytes,Ua SMALL (A) NEGATIVE   RBC / HPF 0-5 0 - 5 RBC/hpf   WBC, UA 11-20 0 - 5 WBC/hpf   Bacteria, UA RARE (A) NONE SEEN   Squamous Epithelial / LPF 0-5 0 - 5   Mucus PRESENT     Comment: Performed at [facility], [address], [place], [place] [address]  Troponin I (High Sensitivity)     Status: None   Collection Time: 08/22/19  9:56 PM  Result Value Ref Range   Troponin I (High Sensitivity) 6 <18 ng/L    Comment: (NOTE) Elevated high sensitivity troponin I (hsTnI) values and significant  changes across serial measurements may suggest ACS but many other  chronic and  acute conditions are known to elevate hsTnI results.  Refer to the "Links" section for chest pain algorithms and additional  guidance. Performed at [facility], [address], [place], [place] [address]     Chemistries  Recent Labs  Lab 08/22/19 2000  NA 140  K 3.7  CL 101  CO2 32  GLUCOSE 89  BUN 24*  CREATININE 0.64  CALCIUM 9.4   ------------------------------------------------------------------------------------------------------------------  ------------------------------------------------------------------------------------------------------------------ CBG: Recent Labs  Lab 08/22/19 1928  GLUCAP 86    --------------------------------------------------------------------------------------------------------------- Urine analysis:    Component Value Date/Time   COLORURINE YELLOW 08/22/2019 2139   APPEARANCEUR CLEAR 08/22/2019 2139   LABSPEC 1.014 08/22/2019 2139   PHURINE 6.0 08/22/2019 2139   GLUCOSEU NEGATIVE 08/22/2019 2139   HGBUR NEGATIVE 08/22/2019 2139   BILIRUBINUR NEGATIVE 08/22/2019 2139   KETONESUR NEGATIVE 08/22/2019 2139   PROTEINUR NEGATIVE 08/22/2019 2139   NITRITE POSITIVE (A) 08/22/2019 2139   LEUKOCYTESUR SMALL (A) 08/22/2019 2139      Imaging Results:    CT Head Wo Contrast  Result Date: 08/22/2019 CLINICAL DATA:  79 year old female with ataxia. EXAM: CT HEAD WITHOUT CONTRAST TECHNIQUE: Contiguous axial images were obtained from the base of the skull through the vertex without intravenous contrast. COMPARISON:  Head CT report dated 08/24/2016. The images are not available for direct comparison. FINDINGS: Brain: Mild age-related atrophy and chronic microvascular ischemic changes. Faint hypodense focus in the anterior horn of the left internal capsule (series 2, image 13), likely chronic. There is no acute intracranial hemorrhage. No mass effect or midline shift. No extra-axial fluid collection. Vascular: No hyperdense vessel or unexpected  calcification. Skull: Normal. Negative for fracture or focal lesion. Sinuses/Orbits: No acute finding. Other: None IMPRESSION: 1. No acute intracranial pathology. 2. Mild age-related atrophy and chronic microvascular ischemic changes. Electronically Signed   By: Anner Crete M.D.   On: 08/22/2019 20:48   DG Chest Port 1 View  Result Date: 08/22/2019 CLINICAL DATA:  Syncopal episode, shortness of breath, Takotsubo cardiomyopathy, hypertension, coronary artery disease EXAM: PORTABLE CHEST 1 VIEW COMPARISON:  Portable exam 2026 hours compared to 12/08/2004 FINDINGS: Normal heart size, mediastinal contours, and pulmonary vascularity. Atherosclerotic calcification aorta. Lungs clear. No infiltrate, pleural effusion or pneumothorax. Bones appear demineralized. IMPRESSION: No acute abnormalities. Electronically Signed   By: Lavonia Dana M.D.   On: 08/22/2019 20:40    My personal review of EKG: Rhythm NSR, no ST-T changes   Assessment & Plan:    Active Problems:   Syncope   1. Unresponsive episode-syncope versus seizure, will obtain orthostatic vital signs, check echocardiogram in a.m.  Monitor on telemetry.  We will also obtain EEG in a.m.  Cardiac enzymes x2 are unremarkable.  EKG is unremarkable. 2. UTI-patient has abnormal UA, will obtain urine culture.  Start ceftriaxone 1 g IV daily.  Follow urine culture results. 3. Seizure disorder-continue Keppra 500 mg p.o. twice daily 4. Hypertension-hold Hyzaar for possible syncope.  Check orthostatic vital signs.  Continue Toprol-XL 12.5 mg daily. 5. Dementia-no behavior disturbance, continue Namenda, BuSpar, Klonopin.    DVT Prophylaxis-   Lovenox  AM Labs Ordered, also please review Full Orders  Family Communication: Admission, patients condition and plan of care including tests being ordered have been discussed with the patient and who indicate understanding and agree with the plan and Code Status.  Code Status: Full code  Admission status:  Inpatient :The appropriate admission status for this patient is INPATIENT. Inpatient status is judged to be reasonable and necessary in order to provide the required intensity of service to ensure the patient's safety. The patient's presenting symptoms, physical exam findings, and initial radiographic and laboratory data in the context of their chronic comorbidities is felt to place them at high risk for further clinical deterioration. Furthermore, it is not anticipated that the patient will be medically stable for discharge from the hospital within 2 midnights of admission. The following factors support the admission status of inpatient.     The patient's presenting symptoms include .  Syncope versus seizure  The initial radiographic and laboratory data are worrisome because of abnormal UA The chronic co-morbidities include history of seizure, dementia       * I certify that at the point of admission it is my clinical judgment that the patient will require inpatient hospital care spanning beyond 2 midnights from the point of admission due to high intensity of service, high risk for further deterioration and high frequency of surveillance required.*  Time spent in minutes : 60 minutes   Chandlor Noecker S Wana Mount M.D

## 2019-08-23 NOTE — Progress Notes (Addendum)
Pt resting comfortably at this time. Upon assessment, pt remains confused and disoriented x3. She denied any discomforts, stating she feels "just wonderful". IV removed by patient during day shift. MD notified of loss of IV access and stated it wasn't necessary to reestablish access due to pts confusion. Telemonitoring has resumed at this time. Will continue to monitor pt.

## 2019-08-23 NOTE — Progress Notes (Signed)
Spoke to RN about EEG. Nurse said pt not having a good day and tomorrow would be a better time.

## 2019-08-23 NOTE — Progress Notes (Signed)
Initial Nutrition Assessment  DOCUMENTATION CODES:   Not applicable  INTERVENTION:  Ensure Enlive po BID, each supplement provides 350 kcal and 20 grams of protein (strawberry)  MVI with minerals daily    NUTRITION DIAGNOSIS:   Inadequate oral intake related to lethargy/confusion as evidenced by per patient/family report, energy intake < or equal to 75% for > or equal to 1 month.  GOAL:   Patient will meet greater than or equal to 90% of their needs    MONITOR:   PO intake, Weight trends, Labs, I & O's, Supplement acceptance  REASON FOR ASSESSMENT:   Malnutrition Screening Tool    ASSESSMENT:   79 year old female with past medical history of stress-induced cardiomyopathy with normalization of EF, HTN, HLD, depression, seizure disorder presented via EMS after syncopal episode at home. Son of pt reports pt not breathing and heart had stopped, became alert after he performed a few seconds of CPR.  Per notes: -cardiac enzymes neg x 2 -CT head without acute abnormalities -2D Echo results pending -acutely delusional, required 2 mg Haldol this morning  Patient sleeping soundly this afternoon with covers pulled above her head, brother of pt reclined in bedside chair. Brother of patient reports that patient has been resting well for the last hour after receiving some medication for anxiety. He reports that patient has dementia that has progressed during the pandemic. He stated that patient use to enjoy window shopping and going to [employer], but has had to stay home over the past year d/t sorosis medications that compromise her immune system.   Brother endorses decreased appetite, states that patient sleeps all day and has rarely eats a full meal, reports that patient sometimes hallucinates, sees children in the room and thinks she needs to share her food with them. He usually is able to get her out of bed around noon to eat a little something, reports that she typically falls asleep  sitting in the chair during dinner. Patient has varied preferences, some days she likes something and then does not like it the next time. He recalls [employer] chicken pot pies, meat with vegetables, pinto beans, and stated that currently she really enjoys roast beef sandwiches from [employer]. He gives her snacks of sugar free strawberry wafers, sugar free cookies, and fruit cocktail in light syrup.   RD suggested eating meals outdoors when able,  having finger foods that are easily assessable to patient to snack on (hard boiled eggs, sting cheese, pre-made sandwiches) and recommended daily nutritional supplement to aid with calorie/protein needs.   UBW 137 lbs ~ 2 months ago per brother Current wt 126.72 lbs Per weight history, pt has lost 4 lbs over the past 2 months which is insignificant for time frame.   Medications reviewed and include: Keppra, Lovaza IVF NaCl Rocephin Labs: CBG 86, K 3.4 (L), BUN 24 (H)   NUTRITION - FOCUSED PHYSICAL EXAM: Unable to complete at this time   Diet Order:   Diet Order            Diet Heart Room service appropriate? Yes; Fluid consistency: Thin  Diet effective now              EDUCATION NEEDS:   No education needs have been identified at this time  Skin:  Skin Assessment: Reviewed RN Assessment  Last BM:  3/4  Height:   Ht Readings from Last 1 Encounters:  08/22/19 5' (1.524 m)    Weight:   Wt Readings from Last 1  Encounters:  08/22/19 57.6 kg    Ideal Body Weight:  47.7 kg  BMI:  Body mass index is 24.8 kg/m.  Estimated Nutritional Needs:   Kcal:  1500-1700  Protein:  75-85  Fluid:  >/= 1.4 L/day   Lars Masson, RD, LDN Clinical Nutrition After Hours/Weekend Pager # in Amion

## 2019-08-23 NOTE — Progress Notes (Signed)
Pt is siting in chair eating from meal tray. Brother is present in room. She is calm and does not show any signs of aggression at this time. She was given 2mg  of Haldol as ordered. Brother has confirmed that he is going to stay with her all day.
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
## 2019-08-23 NOTE — Progress Notes (Signed)
*  PRELIMINARY RESULTS* Echocardiogram 2D Echocardiogram has been performed.  Jennifer [name] 08/23/2019, 11:15 AM

## 2019-08-23 NOTE — Progress Notes (Signed)
Patient seen and examined.  Admitted after midnight secondary after brief loss of consciousness and passing out.  Patient has an underlying history of seizure, anxiety/depression, dementia and also previous stress-induced cardiomyopathy with current normalization of her EF.  Per chart review and son reports after passing out was no breathing and her heart was not beating so she received CPR for a few seconds by him, but the time EMS arrived to the scene the patient was awake and able to follow commands.  Work-up in the ED demonstrated cardiac enzymes neg X 2 and CT head without acute normalities. Stable vital signs.  Please refer to H&P written by Dr. Sharl Ma for further info/details on presentation.   Plan: -Check B12, TSH and MRI -Check EEG and telemetry monitoring -Will check orthostatic vital signs and the whole some of her antihypertensive agents initially -if negative work up, will benefit of outpatient event monitoring. -follow urine cx and for now continue IV fluids and rocephin.  Vassie Loll MD [phone]

## 2019-08-23 NOTE — Progress Notes (Signed)
Pt has been transported to mobile MRI truck for scan. She showed no visible distress and brother was present in room.

## 2019-08-23 NOTE — Progress Notes (Signed)
Pt is sitting in chair, fully clothed, watching television. She remains confused and oriented x 1. Her brother has left for the night and Charge Nurse has been made aware that there is not anyone in the room sitting with patient. RN contacted MD to request Tele be discontinued due to patient not keeping electrodes on for monitoring. MD discontinued.

## 2019-08-24 ENCOUNTER — Inpatient Hospital Stay (HOSPITAL_COMMUNITY)
Admit: 2019-08-24 | Discharge: 2019-08-24 | Disposition: A | Discharge status: Home / Self Care | Payer: Medicare Other | Attending: Family Medicine | Admitting: Family Medicine

## 2019-08-24 DIAGNOSIS — R55 Syncope and collapse: Secondary | ICD-10-CM

## 2019-08-24 LAB — RPR: RPR Ser Ql: NONREACTIVE

## 2019-08-24 MED ORDER — ACETAMINOPHEN 325 MG PO TABS
650.0000 mg | ORAL_TABLET | Freq: Four times a day (QID) | ORAL | Status: DC | PRN
  Administered 2019-08-24: 650 mg via ORAL
  Filled 2019-08-24: qty 2

## 2019-08-24 NOTE — Progress Notes (Signed)
EEG complete - results pending 

## 2019-08-24 NOTE — Procedures (Signed)
Patient Name: Jennifer [name]  MRN: [id number]  Epilepsy Attending: Charlsie Quest  Referring Physician/Provider: Dr Mauro Kaufmann Date: 08/24/2019  Duration: 26.16 mins  Patient history: 79yo F with transient alteration of awareness. EEG to evaluate for seizure.   Level of alertness: awake, asleep  AEDs during EEG study: Keppra, Clonazepam  Technical aspects: This EEG study was done with scalp electrodes positioned according to the 10-20 International system of electrode placement. Electrical activity was acquired at a sampling rate of 500Hz  and reviewed with a high frequency filter of 70Hz  and a low frequency filter of 1Hz . EEG data were recorded continuously and digitally stored.   DESCRIPTION: No clear posterior dominant rhythm was seen. EEG showed continuous generalized 5-7hz  theta slowing. Sleep was characterized by sleep spindles (12-14hz ), maximal frontocentral.   Hyperventilation and photic stimulation were not performed.  ABNORMALITY - Continuous slow, generalized   IMPRESSION: This study is suggestive of mild to moderate diffuse encephalopathy, non specific to etiology. No seizures or epileptiform discharges were seen throughout the recording.  Kyen Taite 

## 2019-08-24 NOTE — Care Management Important Message (Signed)
Important Message  Patient Details  Name: Jennifer [name] MRN: [id number] Date of Birth: 07-29-1940   Medicare Important Message Given:  Yes     Corey Harold 08/24/2019, 1:31 PM

## 2019-08-24 NOTE — Progress Notes (Signed)
PROGRESS NOTE    LAVAUGHN BISIG  YDX:[id number] DOB: July 04, 1940 DOA: 08/22/2019 PCP: Kirstie Peri, MD    Brief Narrative:  Per H&P written by Dr. Ruthe Mannan on 08/23/2019 79 y.o. female, with medical history of stress-induced cardiomyopathy with normalization of EF, hypertension, hyperlipidemia, depression, seizure disorder was brought to the hospital after patient was on floor and passed out.  Patient son stated that she was not breathing and her heart was not beating so they did do CPR on her for few seconds.  After that patient became alert.  By the time EMS arrived patient was awake.  She was brought to the hospital.  Cardiac enzymes x 2 were negative, CT head was unremarkable.   Patient denies any chest pain at this time. Denies nausea vomiting or diarrhea. She was found to have abnormal UA.  Assessment & Plan: 1-syncope/acute metabolic encephalopathy on top of underlying dementia -Continue treatment with antibiotics for UTI and follow culture results -Continue minimizing over sedative medications -Continue current dose of Keppra at there was no signs of epileptic waveforms on her EEG. -Continue supportive care and constant reorientation -Physical therapy evaluation pending.  2-UTI -As mentioned above continue treatment with antibiotics.  3-history of seizure disorder -No active seizure appreciated on EEG -Continue the use of Keppra 500 mg twice a day.  4-hypertension -Vital signs has remained stable while holding some of her antihypertensive agents. -Continue Toprol; continue to follow vital signs.  5-Alzheimer dementia with behavioral disturbances -Constant reorientation -continue Namenda, BuSpar, Paxil and Klonopin. -Continue outpatient follow-up with neurology service as previously scheduled.  6-hyperlipidemia -Continue Lipitor.  DVT prophylaxis: Lovenox Code Status: Full code Family Communication: Brother/caregiver at bedside. Disposition Plan: Remains inpatient,  follow urine culture, continue treatment with IV antibiotics for now; request physical therapy evaluation and follow clinical response.  Patient is calmer today able to follow commands even still oriented only to person.  Per family at bedside now quite back to her baseline but better than yesterday.  Hopefully discharge home in the next 24 to 48 hours.  Consultants:   None  Procedures:   See below for x-ray reports  EEG: No epileptic waveforms appreciated.  Abnormal findings suggesting encephalopathy.  Antimicrobials:  Anti-infectives (From admission, onward)   Start     Dose/Rate Route Frequency Ordered Stop   08/23/19 0600  cefTRIAXone (ROCEPHIN) 1 g in sodium chloride 0.9 % 100 mL IVPB     1 g 200 mL/hr over 30 Minutes Intravenous Every 24 hours 08/23/19 0532        Subjective: Oriented x1, no fever, in no acute distress.  Still somnolent and per brother at bedside not quite back to her baseline.  Objective: Vitals:   08/23/19 1419 08/23/19 2134 08/23/19 2144 08/24/19 0637  BP: (!) 157/62  140/70 129/60  Pulse: 73  66 62  Resp: 18  16 14   Temp: 97.9 F (36.6 C)  98.6 F (37 C) 97.8 F (36.6 C)  TempSrc: Oral  Oral Oral  SpO2: 93% 93% 93% 98%  Weight:      Height:        Intake/Output Summary (Last 24 hours) at 08/24/2019 1655 Last data filed at 08/24/2019 1300 Gross per 24 hour  Intake 567.49 ml  Output --  Net 567.49 ml   Filed Weights   08/22/19 1912  Weight: 57.6 kg    Examination: General exam: Oriented x1, able to follow simple commands; very sleepy this morning.  No nausea, no vomiting, calm and today.  Yesterday throughout the evening and night patient was experiencing significant episodes of behavioral disturbances with agitation and complete disorientation.  No fever. Respiratory system: Clear to auscultation. Respiratory effort normal.  No using accessory muscles. Cardiovascular system:RRR. No murmurs, rubs, gallops. Gastrointestinal system: Abdomen  is nondistended, soft and nontender. No organomegaly or masses felt. Normal bowel sounds heard. Central nervous system: No focal neurological deficits. Extremities: No C/C/E, +pedal pulses Skin: No rashes, lesions or ulcers Psychiatry: Judgement and insight appear impaired secondary to dementia and acute encephalopathy.  Mood & affect appropriate currently..     Data Reviewed: I have personally reviewed following labs and imaging studies  CBC: Recent Labs  Lab 08/22/19 2000 08/23/19 0544  WBC 7.3 8.6  HGB 15.0 14.0  HCT 46.7* 43.5  MCV 102.4* 101.9*  PLT 171 160   Basic Metabolic Panel: Recent Labs  Lab 08/22/19 2000 08/23/19 0544  NA 140 139  K 3.7 3.4*  CL 101 102  CO2 32 29  GLUCOSE 89 105*  BUN 24* 24*  CREATININE 0.64 0.68  CALCIUM 9.4 9.2   GFR: Estimated Creatinine Clearance: 45.3 mL/min (by C-G formula based on SCr of 0.68 mg/dL).   Liver Function Tests: Recent Labs  Lab 08/23/19 0544  AST 23  ALT 28  ALKPHOS 43  BILITOT 1.0  PROT 5.9*  ALBUMIN 3.2*   CBG: Recent Labs  Lab 08/22/19 1928  GLUCAP 86   Thyroid Function Tests: Recent Labs    08/23/19 1030  TSH 2.385   Anemia Panel: Recent Labs    08/23/19 1030  VITAMINB12 496   Urine analysis:    Component Value Date/Time   COLORURINE YELLOW 08/22/2019 2139   APPEARANCEUR CLEAR 08/22/2019 2139   LABSPEC 1.014 08/22/2019 2139   PHURINE 6.0 08/22/2019 2139   GLUCOSEU NEGATIVE 08/22/2019 2139   HGBUR NEGATIVE 08/22/2019 2139   BILIRUBINUR NEGATIVE 08/22/2019 2139   KETONESUR NEGATIVE 08/22/2019 2139   PROTEINUR NEGATIVE 08/22/2019 2139   NITRITE POSITIVE (A) 08/22/2019 2139   LEUKOCYTESUR SMALL (A) 08/22/2019 2139    Recent Results (from the past 240 hour(s))  SARS CORONAVIRUS 2 (TAT 6-24 HRS) Nasopharyngeal Nasopharyngeal Swab     Status: None   Collection Time: 08/22/19 10:39 PM   Specimen: Nasopharyngeal Swab  Result Value Ref Range Status   SARS Coronavirus 2 NEGATIVE  NEGATIVE Final    Comment: (NOTE) SARS-CoV-2 target nucleic acids are NOT DETECTED. The SARS-CoV-2 RNA is generally detectable in upper and lower respiratory specimens during the acute phase of infection. Negative results do not preclude SARS-CoV-2 infection, do not rule out co-infections with other pathogens, and should not be used as the sole basis for treatment or other patient management decisions. Negative results must be combined with clinical observations, patient history, and epidemiological information. The expected result is Negative. Fact Sheet for Patients: HairSlick.no Fact Sheet for Healthcare Providers: quierodirigir.com This test is not yet approved or cleared by the [place] FDA and  has been authorized for detection and/or diagnosis of SARS-CoV-2 by FDA under an Emergency Use Authorization (EUA). This EUA will remain  in effect (meaning this test can be used) for the duration of the COVID-19 declaration under Section 56 4(b)(1) of the Act, 21 U.S.C. section 360bbb-3(b)(1), unless the authorization is terminated or revoked sooner. Performed at [facility], [address] [place], [place] [address]       Radiology Studies: CT Head Wo Contrast  Result Date: 08/22/2019 CLINICAL DATA:  79 year old female with ataxia.  EXAM: CT HEAD WITHOUT CONTRAST TECHNIQUE: Contiguous axial images were obtained from the base of the skull through the vertex without intravenous contrast. COMPARISON:  Head CT report dated 08/24/2016. The images are not available for direct comparison. FINDINGS: Brain: Mild age-related atrophy and chronic microvascular ischemic changes. Faint hypodense focus in the anterior horn of the left internal capsule (series 2, image 13), likely chronic. There is no acute intracranial hemorrhage. No mass effect or midline shift. No extra-axial fluid collection. Vascular: No hyperdense vessel or unexpected  calcification. Skull: Normal. Negative for fracture or focal lesion. Sinuses/Orbits: No acute finding. Other: None IMPRESSION: 1. No acute intracranial pathology. 2. Mild age-related atrophy and chronic microvascular ischemic changes. Electronically Signed   By: Elgie Collard M.D.   On: 08/22/2019 20:48   MR BRAIN W WO CONTRAST  Result Date: 08/23/2019 CLINICAL DATA:  79 year old female with ataxia, altered mental status. History of seizures. EXAM: MRI HEAD WITHOUT AND WITH CONTRAST TECHNIQUE: Multiplanar, multiecho pulse sequences of the brain and surrounding structures were obtained without and with intravenous contrast. CONTRAST:  68mL GADAVIST GADOBUTROL 1 MMOL/ML IV SOLN COMPARISON:  Head CT 08/22/2019. Report of [facility] brain MRI 08/24/2016 and earlier (no images available). FINDINGS: Brain: Partially empty sella. No restricted diffusion to suggest acute infarction. No midline shift, mass effect, evidence of mass lesion, ventriculomegaly, extra-axial collection or acute intracranial hemorrhage. Cervicomedullary junction within normal limits. Minimal to mild for age scattered nonspecific white matter T2 and FLAIR hyperintensity. No cortical encephalomalacia or chronic cerebral blood products identified. Thin slice coronal imaging of the temporal lobes is within normal limits. No significant hippocampal asymmetry or signal abnormality identified. Although there is cerebral volume loss which may disproportionally affect the anterior temporal lobes. No abnormal enhancement identified. No dural thickening. Vascular: Major intracranial vascular flow voids are preserved. The left vertebral artery appears dominant. The major dural venous sinuses are enhancing and appear to be patent. Skull and upper cervical spine: Negative visible cervical spine. Visualized bone marrow signal is within normal limits. Sinuses/Orbits: Postoperative changes to the left globe, otherwise negative orbits. Paranasal  sinuses and mastoids are stable and well pneumatized. Other: Visible internal auditory structures appear normal. Scalp and face soft tissues appear negative. IMPRESSION: 1. No acute intracranial abnormality 2. MRI signal in the brain is essentially normal for age. But there may be disproportionate atrophy of the anterior temporal lobes. Electronically Signed   By: Odessa Fleming M.D.   On: 08/23/2019 09:00   DG Chest Port 1 View  Result Date: 08/22/2019 CLINICAL DATA:  Syncopal episode, shortness of breath, Takotsubo cardiomyopathy, hypertension, coronary artery disease EXAM: PORTABLE CHEST 1 VIEW COMPARISON:  Portable exam 2026 hours compared to 12/08/2004 FINDINGS: Normal heart size, mediastinal contours, and pulmonary vascularity. Atherosclerotic calcification aorta. Lungs clear. No infiltrate, pleural effusion or pneumothorax. Bones appear demineralized. IMPRESSION: No acute abnormalities. Electronically Signed   By: Ulyses Southward M.D.   On: 08/22/2019 20:40   EEG adult  Result Date: 08/24/2019 Charlsie Quest, MD     08/24/2019  4:38 PM Patient Name: CANDEE HOON MRN: [id number] Epilepsy Attending: Charlsie Quest Referring Physician/Provider: Dr Mauro Kaufmann Date: 08/24/2019 Duration: 26.16 mins Patient history: 79yo F with transient alteration of awareness. EEG to evaluate for seizure. Level of alertness: awake, asleep AEDs during EEG study: Keppra, Clonazepam Technical aspects: This EEG study was done with scalp electrodes positioned according to the 10-20 International system of electrode placement. Electrical activity was acquired at a sampling rate of  500Hz  and reviewed with a high frequency filter of 70Hz  and a low frequency filter of 1Hz . EEG data were recorded continuously and digitally stored. DESCRIPTION: No clear posterior dominant rhythm was seen. EEG showed continuous generalized 5-7hz  theta slowing. Sleep was characterized by sleep spindles (12-14hz ), maximal frontocentral.  Hyperventilation and  photic stimulation were not performed. ABNORMALITY - Continuous slow, generalized IMPRESSION: This study is suggestive of mild to moderate diffuse encephalopathy, non specific to etiology. No seizures or epileptiform discharges were seen throughout the recording. Charlsie Questriyanka O Yadav   ECHOCARDIOGRAM COMPLETE  Result Date: 08/23/2019    ECHOCARDIOGRAM REPORT   Patient Name:   Lowella GripBRENDA E Seth Date of Exam: 08/23/2019 Medical Rec #:  [id number]        Height:       60.0 in Accession #:    [id number]       Weight:       127.0 lb Date of Birth:  Dec 11, 1940        BSA:          1.539 m Patient Age:    79 years         BP:           122/60 mmHg Patient Gender: F                HR:           65 bpm. [facility] Procedure: 2D Echo Indications:    Syncope 780.2 / R55  History:        Patient has prior history of Echocardiogram examinations, most                 recent 02/28/2013. Signs/Symptoms:Syncope; Risk                 Factors:Dyslipidemia, Non-Smoker, Hypertension and Diabetes.                 Takotsubo syndrome.  Sonographer:    Jeryl ColumbiaJohanna Elliott RDCS (AE) Referring Phys: [id number] GAGAN S LAMA IMPRESSIONS  1. Left ventricular ejection fraction, by estimation, is 60 to 65%. The left ventricle has normal function. The left ventricle has no regional wall motion abnormalities. Left ventricular diastolic parameters are consistent with Grade I diastolic dysfunction (impaired relaxation).  2. Right ventricular systolic function is normal. The right ventricular size is normal.  3. The mitral valve is normal in structure and function. No evidence of mitral valve regurgitation. No evidence of mitral stenosis.  4. The aortic valve is tricuspid. Aortic valve regurgitation is not visualized. No aortic stenosis is present.  5. The inferior vena cava IVC is small, suggesting low RA pressure. FINDINGS  Left Ventricle: Left ventricular ejection fraction, by estimation, is 60 to 65%. The left ventricle has normal function. The  left ventricle has no regional wall motion abnormalities. The left ventricular internal cavity size was normal in size. There is  no left ventricular hypertrophy. Left ventricular diastolic parameters are consistent with Grade I diastolic dysfunction (impaired relaxation). Right Ventricle: The right ventricular size is normal. No increase in right ventricular wall thickness. Right ventricular systolic function is normal. Left Atrium: Left atrial size was normal in size. Right Atrium: Right atrial size was normal in size. Pericardium: There is no evidence of pericardial effusion. Mitral Valve: The mitral valve is normal in structure and function. No evidence of mitral valve regurgitation. No evidence of mitral valve stenosis. Tricuspid Valve: The tricuspid valve is normal in structure. Tricuspid valve regurgitation is  not demonstrated. No evidence of tricuspid stenosis. Aortic Valve: The aortic valve is tricuspid. . There is mild thickening and mild calcification of the aortic valve. Aortic valve regurgitation is not visualized. No aortic stenosis is present. Mild aortic valve annular calcification. There is mild thickening of the aortic valve. There is mild calcification of the aortic valve. Aortic valve mean gradient measures 2.5 mmHg. Aortic valve peak gradient measures 5.7 mmHg. Aortic valve area, by VTI measures 2.79 cm. Pulmonic Valve: The pulmonic valve was not well visualized. Pulmonic valve regurgitation is not visualized. No evidence of pulmonic stenosis. Aorta: The aortic root is normal in size and structure. Venous: The inferior vena cava IVC is small, suggesting low RA pressure. IAS/Shunts: No atrial level shunt detected by color flow Doppler.  LEFT VENTRICLE PLAX 2D LVIDd:         4.35 cm  Diastology LVIDs:         2.69 cm  LV e' lateral:   5.00 cm/s LV PW:         1.03 cm  LV E/e' lateral: 11.1 LV IVS:        0.96 cm  LV e' medial:    4.35 cm/s LVOT diam:     2.00 cm  LV E/e' medial:  12.7 LV SV:          62 LV SV Index:   41 LVOT Area:     3.14 cm  RIGHT VENTRICLE RV S prime:     10.70 cm/s TAPSE (M-mode): 2.1 cm LEFT ATRIUM             Index       RIGHT ATRIUM           Index LA diam:        3.20 cm 2.08 cm/m  RA Area:     14.50 cm LA Vol (A2C):   26.7 ml 17.35 ml/m RA Volume:   34.00 ml  22.09 ml/m LA Vol (A4C):   28.0 ml 18.19 ml/m LA Biplane Vol: 27.5 ml 17.87 ml/m  AORTIC VALVE AV Area (Vmax):    2.68 cm AV Area (Vmean):   2.64 cm AV Area (VTI):     2.79 cm AV Vmax:           119.45 cm/s AV Vmean:          73.669 cm/s AV VTI:            0.223 m AV Peak Grad:      5.7 mmHg AV Mean Grad:      2.5 mmHg LVOT Vmax:         101.86 cm/s LVOT Vmean:        61.930 cm/s LVOT VTI:          0.198 m LVOT/AV VTI ratio: 0.89  AORTA Ao Root diam: 3.00 cm MITRAL VALVE MV Area (PHT): 2.69 cm    SHUNTS MV Decel Time: 282 msec    Systemic VTI:  0.20 m MV E velocity: 55.30 cm/s  Systemic Diam: 2.00 cm MV A velocity: 94.60 cm/s MV E/A ratio:  0.58 Dina Rich MD Electronically signed by Dina Rich MD Signature Date/Time: 08/23/2019/11:26:20 AM    Final     Scheduled Meds: . aspirin EC  81 mg Oral QPM  . atorvastatin  10 mg Oral QPM  . busPIRone  10 mg Oral BID  . clonazePAM  0.5 mg Oral QPM  . enoxaparin (LOVENOX) injection  40 mg Subcutaneous Q24H  . feeding  supplement (ENSURE ENLIVE)  237 mL Oral BID BM  . levETIRAcetam  500 mg Oral BID  . memantine  28 mg Oral QPM  . metoprolol succinate  12.5 mg Oral QPM  . multivitamin with minerals  1 tablet Oral Daily  . omega-3 acid ethyl esters  1 g Oral QPM  . PARoxetine  40 mg Oral QPM   Continuous Infusions: . sodium chloride 50 mL/hr at 08/24/19 0657  . cefTRIAXone (ROCEPHIN)  IV 1 g (08/24/19 0657)     LOS: 1 day    Time spent: 30 minutes.   Barton Dubois, MD [facility] Pager [phone]   08/24/2019, 4:55 PM

## 2019-08-25 DIAGNOSIS — Z8669 Personal history of other diseases of the nervous system and sense organs: Secondary | ICD-10-CM

## 2019-08-25 DIAGNOSIS — F02818 Dementia in other diseases classified elsewhere, unspecified severity, with other behavioral disturbance: Secondary | ICD-10-CM

## 2019-08-25 DIAGNOSIS — F0281 Dementia in other diseases classified elsewhere with behavioral disturbance: Secondary | ICD-10-CM

## 2019-08-25 MED ORDER — CEFDINIR 300 MG PO CAPS: 300.0000 mg | ORAL_CAPSULE | Freq: Two times a day (BID) | ORAL | 0 refills | Status: AC

## 2019-08-25 NOTE — Evaluation (Signed)
Physical Therapy Evaluation Patient Details Name: Jennifer [name] MRN: [id number] DOB: [birth year] Today's Date: 08/25/2019   History of Present Illness  Sherlene Rickel  is a 79 y.o. female, with medical history of stress-induced cardiomyopathy with normalization of EF, hypertension, hyperlipidemia, depression, seizure disorder was brought to the hospital after patient was on floor and passed out.  Patient son stated that she was not breathing and her heart was not beating so they did do CPR on her for few seconds.  After that patient became alert.  By the time EMS arrived patient was awake.  She was brought to the hospital.  Cardiac enzymes x 2 were negative, CT head was unremarkable.    Clinical Impression  Patient limited for functional mobility as stated below secondary to BLE weakness, fatigue and poor standing balance. Patient requires min assist for bed mobility and transfers which her brother states is near baseline as she typically requires assist with ADLs. Patient unsteady upon standing which improves with use of RW. She ambulates without loss of balance but intermittently drifts with RW into walls/nearby objects and requires verbal cueing for navigation of RW. Patient O2 monitored throughout session on RA she was 93-94% while seated, which decreased to 88% with standing/ ambulating. Patient intrusted on pursed lip breathing and required demonstration to be able to complete. She required some verbal cueing to complete which helped increase O2 saturation after several breaths. Patient will benefit from continued physical therapy in recommended venue below to increase strength, balance, endurance for safe ADLs and gait.     Follow Up Recommendations Home health PT;Supervision/Assistance - 24 hour;Supervision for mobility/OOB    Equipment Recommendations  Rolling walker with 5" wheels    Recommendations for Other Services       Precautions / Restrictions Precautions Precautions:  Fall Restrictions Weight Bearing Restrictions: No      Mobility  Bed Mobility Overal bed mobility: Needs Assistance Bed Mobility: Supine to Sit;Sit to Supine     Supine to sit: Min assist Sit to supine: Min assist   General bed mobility comments: to transition to seated  Transfers Overall transfer level: Needs assistance Equipment used: Rolling walker (2 wheeled) Transfers: Sit to/from [facility] Sit to Stand: Min assist Stand pivot transfers: Min assist       General transfer comment: to transfer to standing  Ambulation/Gait Ambulation/Gait assistance: Min assist Gait Distance (Feet): 40 Feet Assistive device: Rolling walker (2 wheeled) Gait Pattern/deviations: Decreased step length - left;Decreased stance time - right;Shuffle;Drifts right/left Gait velocity: decreased   General Gait Details: slow labored cadence with drifting into wall/nearby objects with RW  Stairs            Wheelchair Mobility    Modified Rankin (Stroke Patients Only)       Balance Overall balance assessment: Needs assistance Sitting-balance support: Feet supported;No upper extremity supported Sitting balance-Leahy Scale: Good Sitting balance - Comments: seated EOB   Standing balance support: Bilateral upper extremity supported;During functional activity Standing balance-Leahy Scale: Fair Standing balance comment: using RW                             Pertinent Vitals/Pain Pain Assessment: No/denies pain    Home Living Family/patient expects to be discharged to:: Private residence Living Arrangements: Children;Other relatives(brother primary caregiver) Available Help at Discharge: Family;Available 24 hours/day Type of Home: House Home Access: Stairs to enter Entrance Stairs-Rails: Right Entrance Stairs-Number of Steps: 4 Home Layout:  One level Home Equipment: Cane - single point;Shower seat      Prior Function Level of Independence: Needs  assistance   Gait / Transfers Assistance Needed: household ambulator without AD  ADL's / Homemaking Assistance Needed: family assists with ADL        Hand Dominance        Extremity/Trunk Assessment   Upper Extremity Assessment Upper Extremity Assessment: Generalized weakness    Lower Extremity Assessment Lower Extremity Assessment: Generalized weakness    Cervical / Trunk Assessment Cervical / Trunk Assessment: Normal  Communication   Communication: No difficulties  Cognition Arousal/Alertness: Awake/alert Behavior During Therapy: WFL for tasks assessed/performed;Flat affect Overall Cognitive Status: History of cognitive impairments - at baseline                                 General Comments: Patient has hx dementia      General Comments      Exercises     Assessment/Plan    PT Assessment All further PT needs can be met in the next venue of care  PT Problem List Decreased strength;Decreased cognition;Decreased activity tolerance;Decreased safety awareness;Decreased knowledge of use of DME;Decreased balance;Decreased mobility       PT Treatment Interventions      PT Goals (Current goals can be found in the Care Plan section)  Acute Rehab PT Goals Patient Stated Goal: return home with brother to assist PT Goal Formulation: With patient/family Time For Goal Achievement: 08/25/19 Potential to Achieve Goals: Good    Frequency     Barriers to discharge        Co-evaluation               AM-PAC PT "6 Clicks" Mobility  Outcome Measure Help needed turning from your back to your side while in a flat bed without using bedrails?: None Help needed moving from lying on your back to sitting on the side of a flat bed without using bedrails?: A Little Help needed moving to and from a bed to a chair (including a wheelchair)?: A Little Help needed standing up from a chair using your arms (e.g., wheelchair or bedside chair)?: A Little Help  needed to walk in hospital room?: A Little Help needed climbing 3-5 steps with a railing? : A Lot 6 Click Score: 18    End of Session Equipment Utilized During Treatment: Gait belt Activity Tolerance: Patient tolerated treatment well;Patient limited by fatigue Patient left: in bed;with family/visitor present;with call bell/phone within reach Nurse Communication: Mobility status PT Visit Diagnosis: Unsteadiness on feet (R26.81);Other abnormalities of gait and mobility (R26.89);Muscle weakness (generalized) (M62.81);History of falling (Z91.81)    Time: 3833-3832 PT Time Calculation (min) (ACUTE ONLY): 24 min   Charges:   PT Evaluation $PT Eval Moderate Complexity: 1 Mod PT Treatments $Therapeutic Activity: 8-22 mins        12:16 PM, 08/25/19 Mearl Latin PT, DPT Physical Therapist at [place] 08/25/2019, 12:12 PM

## 2019-08-25 NOTE — Discharge Summary (Signed)
Physician Discharge Summary  Jennifer [name] UGQ:[id number] DOB: Jan 16, 1941 DOA: 08/22/2019  PCP: Kirstie Peri, MD  Admit date: 08/22/2019 Discharge date: 08/25/2019  Time spent: 35 minutes  Recommendations for Outpatient Follow-up:  1. Repeat basic metabolic panel to follow electrolytes and renal function 2. Reassess blood pressure and adjust antihypertensive regimen as needed.   Discharge Diagnoses:  Active Problems:   Essential hypertension   Syncope and collapse   Seizure (HCC)   Alzheimer's dementia with behavioral disturbance (HCC) Acute metabolic encephalopathy. Physical deconditioning.  Discharge Condition: Stable and improved. Patient discharged home with instruction to follow-up with PCP in 10 days.  Code status: full code.  Diet recommendation: Heart healthy diet.  Filed Weights   08/22/19 1912  Weight: 57.6 kg    History of present illness:  Per H&P written by Dr. Sharl Ma on 08/23/2019 79 y.o.female,with medical history of stress-induced cardiomyopathy with normalization of EF, hypertension, hyperlipidemia, depression, seizure disorder was brought to the hospital after patient was on floor and passed out. Patient son stated that she was not breathing and her heart was not beating so they did do CPR on her for few seconds. After that patient became alert. By the time EMS arrived patient was awake. She was brought to the hospital. Cardiac enzymes x 2 were negative, CT head was unremarkable.  Patient denies any chest pain at this time. Denies nausea vomiting or diarrhea. She was found to have abnormal UA.  Hospital Course:   1-syncope/acute metabolic encephalopathy on top of underlying dementia -Continue treatment with antibiotics for UTI and follow culture results -Continue minimizing over sedative medications -Continue current dose of Keppra as there was no signs of epileptic waveforms on her EEG. -Continue supportive care and constant reorientation -Physical  therapy evaluation appreciated; recommendations given for home health PT with the use of a rolling walker.  2-UTI -Patient will be treated with cefdinir 300 mg twice a day for four more days to complete antibiotic therapy. -Advised to maintain adequate hydration. -Final urine culture results pending at discharge.  3-history of seizure disorder -No active seizure appreciated on EEG -Continue the use of Keppra 500 mg twice a day.  4-hypertension -Vital signs has remained stable while holding some of her antihypertensive agents. -Orthostatic vital signs negative. -Continue Toprol; continue to follow vital signs.  5-Alzheimer dementia with behavioral disturbances -Constant reorientation -continue Namenda, BuSpar, Paxil and Klonopin. -Continue outpatient follow-up with neurology service as previously scheduled.  6-hyperlipidemia -Continue Lipitor.  Procedures:  See below for x-ray reports.  EEG: No epileptic waveforms appreciated. Global abnormality in the setting of encephalopathy appreciated.  Consultations:  None  Discharge Exam: Vitals:   08/25/19 0900 08/25/19 1300  BP: (!) 109/44 116/76  Pulse: 61 63  Resp: 17 16  Temp: 97.8 F (36.6 C) 98.2 F (36.8 C)  SpO2: 100% 93%   General exam: Oriented x1, able to follow simple commands; somnolent but arousable. No fever, no nausea, no vomiting, no chest pain. No abnormalities appreciated on telemetry. EEG without acute epileptiform waves appreciated. Family expressed patient behavior is close to her baseline. Respiratory system: Clear to auscultation. Respiratory effort normal.  No using accessory muscles. Cardiovascular system:RRR. No murmurs, rubs, gallops. Gastrointestinal system: Abdomen is nondistended, soft and nontender. No organomegaly or masses felt. Normal bowel sounds heard. Central nervous system: No focal neurological deficits. Extremities: No C/C/E, +pedal pulses Skin: No rashes, lesions or  ulcers Psychiatry: Judgement and insight appear impaired secondary to dementia and acute encephalopathy.  Mood & affect appropriate  currently.Marland Kitchen   Discharge Instructions   Discharge Instructions    Diet - low sodium heart healthy   Complete by: As directed    Discharge instructions   Complete by: As directed    Maintain adequate hydration Take medications as prescribed Follow-up with PCP in 10 days Outpatient follow-up with neurology as previously instructed. Complete antibiotic therapy for UTI as prescribed.   Increase activity slowly   Complete by: As directed      Allergies as of 08/25/2019      Reactions   Cyclobenzaprine    TOO STRONG  CAN'T USE AND WORK      Medication List    STOP taking these medications   losartan-hydrochlorothiazide 50-12.5 MG tablet Commonly known as: HYZAAR     TAKE these medications   aspirin 81 MG EC tablet Take 1 tablet (81 mg total) by mouth daily. Swallow whole. What changed: when to take this   atorvastatin 10 MG tablet Commonly known as: LIPITOR TAKE 1 TABLET AT 6:00 P.M. What changed: See the new instructions.   Biotin 5000 MCG Caps Take 10,000 mcg by mouth every evening.   busPIRone 10 MG tablet Commonly known as: BUSPAR Take 10 mg by mouth 2 (two) times daily.   calcium carbonate 1500 (600 Ca) MG Tabs tablet Commonly known as: OSCAL Take 600 mg of elemental calcium by mouth 2 (two) times daily with a meal.   cefdinir 300 MG capsule Commonly known as: OMNICEF Take 1 capsule (300 mg total) by mouth 2 (two) times daily for 4 days.   CENTRUM SILVER PO Take 1 tablet by mouth every evening.   clobetasol cream 0.05 % Commonly known as: TEMOVATE Apply 1 application topically 2 (two) times daily as needed (for eczema).   clonazePAM 0.5 MG tablet Commonly known as: KLONOPIN Take 1 tablet by mouth every evening.   fish oil-omega-3 fatty acids 1000 MG capsule Take 1 g by mouth every evening.   hydroxypropyl  methylcellulose / hypromellose 2.5 % ophthalmic solution Commonly known as: ISOPTO TEARS / GONIOVISC Place 1 drop into the left eye daily as needed for dry eyes.   levETIRAcetam 500 MG tablet Commonly known as: KEPPRA Take 1 tablet (500 mg total) by mouth 2 (two) times daily.   memantine 28 MG Cp24 24 hr capsule Commonly known as: NAMENDA XR Take 1 capsule (28 mg total) by mouth daily. What changed: when to take this   metoprolol succinate 25 MG 24 hr tablet Commonly known as: TOPROL-XL TAKE (1/2) TABLET BY MOUTH DAILY. What changed: See the new instructions.   NONFORMULARY OR COMPOUNDED ITEM Apply 1 application topically 2 (two) times daily as needed (for inverse psoriasis). Triamcinolone Cream 1%: Silver Sulfadiazine   PARoxetine 40 MG tablet Commonly known as: PAXIL Take 40 mg by mouth every evening.   polyethylene glycol-electrolytes 420 g solution Commonly known as: NuLYTELY As directed   polyethylene glycol-electrolytes 420 g solution Commonly known as: TriLyte Take 4,000 mLs by mouth as directed.   Taltz 80 MG/ML Sosy Generic drug: Ixekizumab Inject 1 Syringe into the skin every 28 (twenty-eight) days.   triamcinolone lotion 0.1 % Commonly known as: KENALOG Apply 1 application topically 2 (two) times daily as needed. With Silver Sulfadiazine 1%            Durable Medical Equipment  (From admission, onward)         Start     Ordered   08/25/19 1500  For home use only DME Walker rolling  Once    Question Answer Comment  Walker: With 5 Inch Wheels   Patient needs a walker to treat with the following condition Physical deconditioning      08/25/19 1459         Allergies  Allergen Reactions  . Cyclobenzaprine     TOO STRONG  CAN'T USE AND WORK   Follow-up Information    Kirstie PeriShah, Ashish, MD. Schedule an appointment as soon as possible for a visit in 10 day(s).   Specialty: Internal Medicine Contact information: [address] [place]  [id number] [phone]        Jonelle SidleMcDowell, Samuel G, MD .   Specialty: Cardiology Contact information: [address] [place] [address] [phone]           The results of significant diagnostics from this hospitalization (including imaging, microbiology, ancillary and laboratory) are listed below for reference.    Significant Diagnostic Studies: CT Head Wo Contrast  Result Date: 08/22/2019 CLINICAL DATA:  79 year old female with ataxia. EXAM: CT HEAD WITHOUT CONTRAST TECHNIQUE: Contiguous axial images were obtained from the base of the skull through the vertex without intravenous contrast. COMPARISON:  Head CT report dated 08/24/2016. The images are not available for direct comparison. FINDINGS: Brain: Mild age-related atrophy and chronic microvascular ischemic changes. Faint hypodense focus in the anterior horn of the left internal capsule (series 2, image 13), likely chronic. There is no acute intracranial hemorrhage. No mass effect or midline shift. No extra-axial fluid collection. Vascular: No hyperdense vessel or unexpected calcification. Skull: Normal. Negative for fracture or focal lesion. Sinuses/Orbits: No acute finding. Other: None IMPRESSION: 1. No acute intracranial pathology. 2. Mild age-related atrophy and chronic microvascular ischemic changes. Electronically Signed   By: Elgie CollardArash  Radparvar M.D.   On: 08/22/2019 20:48   MR BRAIN W WO CONTRAST  Result Date: 08/23/2019 CLINICAL DATA:  79 year old female with ataxia, altered mental status. History of seizures. EXAM: MRI HEAD WITHOUT AND WITH CONTRAST TECHNIQUE: Multiplanar, multiecho pulse sequences of the brain and surrounding structures were obtained without and with intravenous contrast. CONTRAST:  7mL GADAVIST GADOBUTROL 1 MMOL/ML IV SOLN COMPARISON:  Head CT 08/22/2019. Report of [facility] brain MRI 08/24/2016 and earlier (no images available). FINDINGS: Brain: Partially empty sella. No restricted diffusion  to suggest acute infarction. No midline shift, mass effect, evidence of mass lesion, ventriculomegaly, extra-axial collection or acute intracranial hemorrhage. Cervicomedullary junction within normal limits. Minimal to mild for age scattered nonspecific white matter T2 and FLAIR hyperintensity. No cortical encephalomalacia or chronic cerebral blood products identified. Thin slice coronal imaging of the temporal lobes is within normal limits. No significant hippocampal asymmetry or signal abnormality identified. Although there is cerebral volume loss which may disproportionally affect the anterior temporal lobes. No abnormal enhancement identified. No dural thickening. Vascular: Major intracranial vascular flow voids are preserved. The left vertebral artery appears dominant. The major dural venous sinuses are enhancing and appear to be patent. Skull and upper cervical spine: Negative visible cervical spine. Visualized bone marrow signal is within normal limits. Sinuses/Orbits: Postoperative changes to the left globe, otherwise negative orbits. Paranasal sinuses and mastoids are stable and well pneumatized. Other: Visible internal auditory structures appear normal. Scalp and face soft tissues appear negative. IMPRESSION: 1. No acute intracranial abnormality 2. MRI signal in the brain is essentially normal for age. But there may be disproportionate atrophy of the anterior temporal lobes. Electronically Signed   By: Odessa FlemingH  Hall M.D.   On: 08/23/2019 09:00  DG Chest Port 1 View  Result Date: 08/22/2019 CLINICAL DATA:  Syncopal episode, shortness of breath, Takotsubo cardiomyopathy, hypertension, coronary artery disease EXAM: PORTABLE CHEST 1 VIEW COMPARISON:  Portable exam 2026 hours compared to 12/08/2004 FINDINGS: Normal heart size, mediastinal contours, and pulmonary vascularity. Atherosclerotic calcification aorta. Lungs clear. No infiltrate, pleural effusion or pneumothorax. Bones appear demineralized. IMPRESSION:  No acute abnormalities. Electronically Signed   By: Ulyses Southward M.D.   On: 08/22/2019 20:40   EEG adult  Result Date: 08/24/2019 Charlsie Quest, MD     08/24/2019  4:38 PM Patient Name: Jennifer [name] MRN: [id number] Epilepsy Attending: Charlsie Quest Referring Physician/Provider: Dr Mauro Kaufmann Date: 08/24/2019 Duration: 26.16 mins Patient history: 79yo F with transient alteration of awareness. EEG to evaluate for seizure. Level of alertness: awake, asleep AEDs during EEG study: Keppra, Clonazepam Technical aspects: This EEG study was done with scalp electrodes positioned according to the 10-20 International system of electrode placement. Electrical activity was acquired at a sampling rate of 500Hz  and reviewed with a high frequency filter of 70Hz  and a low frequency filter of 1Hz . EEG data were recorded continuously and digitally stored. DESCRIPTION: No clear posterior dominant rhythm was seen. EEG showed continuous generalized 5-7hz  theta slowing. Sleep was characterized by sleep spindles (12-14hz ), maximal frontocentral.  Hyperventilation and photic stimulation were not performed. ABNORMALITY - Continuous slow, generalized IMPRESSION: This study is suggestive of mild to moderate diffuse encephalopathy, non specific to etiology. No seizures or epileptiform discharges were seen throughout the recording.   ECHOCARDIOGRAM COMPLETE  Result Date: 08/23/2019    ECHOCARDIOGRAM REPORT   Patient Name:   Jennifer [name] Date of Exam: 08/23/2019 Medical Rec #:  10/23/2019        Height:       60.0 in Accession #:    [place] Grip       Weight:       127.0 lb Date of Birth:  30-May-1941        BSA:          1.539 m Patient Age:    79 years         BP:           122/60 mmHg Patient Gender: F                HR:           65 bpm. Exam Location:  [id number] Procedure: 2D Echo Indications:    Syncope 780.2 / R55  History:        Patient has prior history of Echocardiogram examinations, most                  recent 02/28/2013. Signs/Symptoms:Syncope; Risk                 Factors:Dyslipidemia, Non-Smoker, Hypertension and Diabetes.                 Takotsubo syndrome.  Sonographer:    07/14/1940 RDCS (AE) Referring Phys: Jeani Hawking LAMA IMPRESSIONS  1. Left ventricular ejection fraction, by estimation, is 60 to 65%. The left ventricle has normal function. The left ventricle has no regional wall motion abnormalities. Left ventricular diastolic parameters are consistent with Grade I diastolic dysfunction (impaired relaxation).  2. Right ventricular systolic function is normal. The right ventricular size is normal.  3. The mitral valve is normal in structure and function. No evidence of mitral valve regurgitation. No evidence of mitral  stenosis.  4. The aortic valve is tricuspid. Aortic valve regurgitation is not visualized. No aortic stenosis is present.  5. The inferior vena cava IVC is small, suggesting low RA pressure. FINDINGS  Left Ventricle: Left ventricular ejection fraction, by estimation, is 60 to 65%. The left ventricle has normal function. The left ventricle has no regional wall motion abnormalities. The left ventricular internal cavity size was normal in size. There is  no left ventricular hypertrophy. Left ventricular diastolic parameters are consistent with Grade I diastolic dysfunction (impaired relaxation). Right Ventricle: The right ventricular size is normal. No increase in right ventricular wall thickness. Right ventricular systolic function is normal. Left Atrium: Left atrial size was normal in size. Right Atrium: Right atrial size was normal in size. Pericardium: There is no evidence of pericardial effusion. Mitral Valve: The mitral valve is normal in structure and function. No evidence of mitral valve regurgitation. No evidence of mitral valve stenosis. Tricuspid Valve: The tricuspid valve is normal in structure. Tricuspid valve regurgitation is not demonstrated. No evidence of tricuspid  stenosis. Aortic Valve: The aortic valve is tricuspid. . There is mild thickening and mild calcification of the aortic valve. Aortic valve regurgitation is not visualized. No aortic stenosis is present. Mild aortic valve annular calcification. There is mild thickening of the aortic valve. There is mild calcification of the aortic valve. Aortic valve mean gradient measures 2.5 mmHg. Aortic valve peak gradient measures 5.7 mmHg. Aortic valve area, by VTI measures 2.79 cm. Pulmonic Valve: The pulmonic valve was not well visualized. Pulmonic valve regurgitation is not visualized. No evidence of pulmonic stenosis. Aorta: The aortic root is normal in size and structure. Venous: The inferior vena cava IVC is small, suggesting low RA pressure. IAS/Shunts: No atrial level shunt detected by color flow Doppler.  LEFT VENTRICLE PLAX 2D LVIDd:         4.35 cm  Diastology LVIDs:         2.69 cm  LV e' lateral:   5.00 cm/s LV PW:         1.03 cm  LV E/e' lateral: 11.1 LV IVS:        0.96 cm  LV e' medial:    4.35 cm/s LVOT diam:     2.00 cm  LV E/e' medial:  12.7 LV SV:         62 LV SV Index:   41 LVOT Area:     3.14 cm  RIGHT VENTRICLE RV S prime:     10.70 cm/s TAPSE (M-mode): 2.1 cm LEFT ATRIUM             Index       RIGHT ATRIUM           Index LA diam:        3.20 cm 2.08 cm/m  RA Area:     14.50 cm LA Vol (A2C):   26.7 ml 17.35 ml/m RA Volume:   34.00 ml  22.09 ml/m LA Vol (A4C):   28.0 ml 18.19 ml/m LA Biplane Vol: 27.5 ml 17.87 ml/m  AORTIC VALVE AV Area (Vmax):    2.68 cm AV Area (Vmean):   2.64 cm AV Area (VTI):     2.79 cm AV Vmax:           119.45 cm/s AV Vmean:          73.669 cm/s AV VTI:            0.223 m AV Peak Grad:  5.7 mmHg AV Mean Grad:      2.5 mmHg LVOT Vmax:         101.86 cm/s LVOT Vmean:        61.930 cm/s LVOT VTI:          0.198 m LVOT/AV VTI ratio: 0.89  AORTA Ao Root diam: 3.00 cm MITRAL VALVE MV Area (PHT): 2.69 cm    SHUNTS MV Decel Time: 282 msec    Systemic VTI:  0.20 m MV E  velocity: 55.30 cm/s  Systemic Diam: 2.00 cm MV A velocity: 94.60 cm/s MV E/A ratio:  0.58 Dina Rich MD Electronically signed by Dina Rich MD Signature Date/Time: 08/23/2019/11:26:20 AM    Final     Microbiology: Recent Results (from the past 240 hour(s))  SARS CORONAVIRUS 2 (TAT 6-24 HRS) Nasopharyngeal Nasopharyngeal Swab     Status: None   Collection Time: 08/22/19 10:39 PM   Specimen: Nasopharyngeal Swab  Result Value Ref Range Status   SARS Coronavirus 2 NEGATIVE NEGATIVE Final    Comment: (NOTE) SARS-CoV-2 target nucleic acids are NOT DETECTED. The SARS-CoV-2 RNA is generally detectable in upper and lower respiratory specimens during the acute phase of infection. Negative results do not preclude SARS-CoV-2 infection, do not rule out co-infections with other pathogens, and should not be used as the sole basis for treatment or other patient management decisions. Negative results must be combined with clinical observations, patient history, and epidemiological information. The expected result is Negative. Fact Sheet for Patients: HairSlick.no Fact Sheet for Healthcare Providers: quierodirigir.com This test is not yet approved or cleared by the [place] FDA and  has been authorized for detection and/or diagnosis of SARS-CoV-2 by FDA under an Emergency Use Authorization (EUA). This EUA will remain  in effect (meaning this test can be used) for the duration of the COVID-19 declaration under Section 56 4(b)(1) of the Act, 21 U.S.C. section 360bbb-3(b)(1), unless the authorization is terminated or revoked sooner. Performed at [facility], [address] [place], [place] [address]      Labs: Basic Metabolic Panel: Recent Labs  Lab 08/22/19 2000 08/23/19 0544  NA 140 139  K 3.7 3.4*  CL 101 102  CO2 32 29  GLUCOSE 89 105*  BUN 24* 24*  CREATININE 0.64 0.68  CALCIUM 9.4 9.2   Liver Function  Tests: Recent Labs  Lab 08/23/19 0544  AST 23  ALT 28  ALKPHOS 43  BILITOT 1.0  PROT 5.9*  ALBUMIN 3.2*   CBC: Recent Labs  Lab 08/22/19 2000 08/23/19 0544  WBC 7.3 8.6  HGB 15.0 14.0  HCT 46.7* 43.5  MCV 102.4* 101.9*  PLT 171 160   CBG: Recent Labs  Lab 08/22/19 1928  GLUCAP 86    Signed:  Vassie Loll MD.  [facility] 08/25/2019, 3:10 PM

## 2019-08-25 NOTE — Clinical Social Work Note (Signed)
Cassie with [facility] accepts referral for HHPT. RW ordered through [facility] and will be mailed to patient's home.    Rivers Hamrick, Juleen China, LCSW

## 2019-08-27 DIAGNOSIS — Z23 Encounter for immunization: Secondary | ICD-10-CM | POA: Diagnosis not present

## 2019-08-28 ENCOUNTER — Other Ambulatory Visit (HOSPITAL_COMMUNITY): Payer: Medicare Other

## 2019-08-28 ENCOUNTER — Telehealth: Payer: Self-pay | Admitting: Neurology

## 2019-08-28 DIAGNOSIS — Z299 Encounter for prophylactic measures, unspecified: Secondary | ICD-10-CM | POA: Diagnosis not present

## 2019-08-28 DIAGNOSIS — M549 Dorsalgia, unspecified: Secondary | ICD-10-CM | POA: Diagnosis not present

## 2019-08-28 DIAGNOSIS — R569 Unspecified convulsions: Secondary | ICD-10-CM | POA: Diagnosis not present

## 2019-08-28 DIAGNOSIS — R809 Proteinuria, unspecified: Secondary | ICD-10-CM | POA: Diagnosis not present

## 2019-08-28 DIAGNOSIS — E1129 Type 2 diabetes mellitus with other diabetic kidney complication: Secondary | ICD-10-CM | POA: Diagnosis not present

## 2019-08-28 DIAGNOSIS — E1165 Type 2 diabetes mellitus with hyperglycemia: Secondary | ICD-10-CM | POA: Diagnosis not present

## 2019-08-28 DIAGNOSIS — Z789 Other specified health status: Secondary | ICD-10-CM | POA: Diagnosis not present

## 2019-08-28 NOTE — Telephone Encounter (Signed)
Patient's brother called in to let Dr. Everlena Cooper know that Jennifer [name] was admitted to [facility] on 3/3-3/6. He said that she was taken by EMS and that her heart stopped. He said that she had an EEG that showed something had happened a couple days earlier. He was told that she needed to follow up with her PCP and her Neurologist. She has seen her PCP. Is there a sooner spot she can be seen before July? Please Advise. Thank you

## 2019-08-28 NOTE — Telephone Encounter (Signed)
Please advise on Wednesday.

## 2019-08-29 DIAGNOSIS — R5381 Other malaise: Secondary | ICD-10-CM | POA: Diagnosis not present

## 2019-08-29 DIAGNOSIS — R262 Difficulty in walking, not elsewhere classified: Secondary | ICD-10-CM | POA: Diagnosis not present

## 2019-08-29 DIAGNOSIS — F0281 Dementia in other diseases classified elsewhere with behavioral disturbance: Secondary | ICD-10-CM | POA: Diagnosis not present

## 2019-08-29 DIAGNOSIS — N390 Urinary tract infection, site not specified: Secondary | ICD-10-CM | POA: Diagnosis not present

## 2019-08-29 DIAGNOSIS — G9341 Metabolic encephalopathy: Secondary | ICD-10-CM | POA: Diagnosis not present

## 2019-08-29 DIAGNOSIS — R55 Syncope and collapse: Secondary | ICD-10-CM | POA: Diagnosis not present

## 2019-08-29 DIAGNOSIS — I10 Essential (primary) hypertension: Secondary | ICD-10-CM | POA: Diagnosis not present

## 2019-08-29 DIAGNOSIS — G309 Alzheimer's disease, unspecified: Secondary | ICD-10-CM | POA: Diagnosis not present

## 2019-08-29 NOTE — Telephone Encounter (Signed)
Yes, they can make a sooner appointment.

## 2019-08-29 NOTE — Telephone Encounter (Signed)
Pt is sch °

## 2019-08-29 NOTE — Telephone Encounter (Signed)
Please schedule. thanks

## 2019-08-30 ENCOUNTER — Encounter (HOSPITAL_COMMUNITY): Payer: Self-pay

## 2019-08-30 ENCOUNTER — Ambulatory Visit (HOSPITAL_COMMUNITY): Admit: 2019-08-30 | Payer: Medicare Other | Admitting: Internal Medicine

## 2019-08-30 SURGERY — COLONOSCOPY WITH PROPOFOL
Anesthesia: Monitor Anesthesia Care

## 2019-08-30 NOTE — Progress Notes (Addendum)
NEUROLOGY FOLLOW UP OFFICE NOTE  Jennifer [name] [id number]  HISTORY OF PRESENT ILLNESS: Jennifer [name] is a 79 year old right-handed woman with hypertension,Takotsubo cardiomyopathyand hyperlipidemia who follows up for Alzheimer's dementia and recurrent episodes of unresponsiveness. She is accompanied by her brother who supplements history.  UPDATE: Current medications: Namenda XR 28 mg daily for Alzheimer's disease. Keppra 500 mg twice daily for possible complex partial seizures.  For anxiety, she takes BuSpar 10mg  twice daily and clonazepam as needed (rarely).    Upset and didn't want to stand up.  Got her son, when came back unresponsive and not breathing son CPR.  Couple of minutes.    She was admitted to [facility] on 08/22/2019 after found on the floor passed out.  She was arguing with her brother.  She got on the floor and crawled to the corner.  Her brother left to get her son.  When they returned after a couple of minutes, she was unresponsive on the floor. Initially, her son noted that she was not breathing and did not have a pulse, so he initiated CPR for a minute and she regained a pulse.  She was awake when EMS arrived.  She did not bite her tongue or have incontinence.  CT head was unremarkable.  Cardiac enzymes were negative.  Echocardiogram showed EF 60-65% with grade 1 diastolic dysfunction.   EEG showed diffuse slowing but no evidence of seizure.  She was found to have a UTI and started on antibiotics.  HISTORY: She began having noticeable memory problems in late 2014 after some medical issues, such as kidney stones, MI and back surgery. She began having problems remembering how to get to familiar places, such as the dentist or friend's house. She also reports difficulty remembering names of people she has known for years. She denies difficulty recognizing faces. She denies hallucinations, poor sleep or depression. She has not exhibited episodes of agitation  or acute confusion. Her brother lives with her. Her son lives next door. Her brother now handles her bills and finances. She no longer drives. She performs all of her ADLs and keeps the house tidy. She exhibits no change in behavior or personality.  In May 2016, she had an episode of unresponsiveness lasting several minutes. She was with her son when suddenly her head dropped with eyes still open but zoned out. She was unresponsive and did not talk. She did not exhibit any twitching or incontinence. She had an MRI of the brain performed on 11/13/14 which showed age appropriate atrophy and mild chronic small vessel disease. Carotid doppler performed on 11/25/14 showed less than 50% bilateral ICA stenosis. She has had two other similar spells only lasting about 30 seconds. EEG performed on 01/15/15, which was normal. She had another spell in on 08/24/16. She presented to the [facility] ED for further evaluation. MRI of brain revealed mild chronic small vessel disease but no acute stroke or mass lesion. . Aricept was discontinued because it may lower seizure threshold.  Both parents died in their 51s and had no history of dementia.  PAST MEDICAL HISTORY: Past Medical History:  Diagnosis Date  . Anxiety   . Borderline diabetes   . Coronary atherosclerosis of native coronary artery    Nonobstructive at catheterization June 2014  . DDD (degenerative disc disease), lumbar   . Depression   . Essential hypertension   . Hyperlipidemia   . Nephrolithiasis   . Psoriatic arthritis (HCC)   . Takotsubo cardiomyopathy  LVEF 60-65% September 2014    MEDICATIONS: Current Outpatient Medications on File Prior to Visit  Medication Sig Dispense Refill  . aspirin 81 MG EC tablet Take 1 tablet (81 mg total) by mouth daily. Swallow whole. (Patient taking differently: Take 81 mg by mouth every evening. Swallow whole.) 30 tablet 12  . atorvastatin (LIPITOR) 10 MG tablet TAKE 1 TABLET AT 6:00 P.M.  (Patient taking differently: Take 10 mg by mouth every evening. ) 30 tablet 3  . Biotin 5000 MCG CAPS Take 10,000 mcg by mouth every evening.     . busPIRone (BUSPAR) 10 MG tablet Take 10 mg by mouth 2 (two) times daily.     . calcium carbonate (OSCAL) 1500 (600 Ca) MG TABS tablet Take 600 mg of elemental calcium by mouth 2 (two) times daily with a meal.    . clobetasol cream (TEMOVATE) 0.05 % Apply 1 application topically 2 (two) times daily as needed (for eczema).     . clonazePAM (KLONOPIN) 0.5 MG tablet Take 1 tablet by mouth every evening.     . fish oil-omega-3 fatty acids 1000 MG capsule Take 1 g by mouth every evening.     . hydroxypropyl methylcellulose / hypromellose (ISOPTO TEARS / GONIOVISC) 2.5 % ophthalmic solution Place 1 drop into the left eye daily as needed for dry eyes.     . Ixekizumab (TALTZ) 80 MG/ML SOSY Inject 1 Syringe into the skin every 28 (twenty-eight) days.     Marland Kitchen levETIRAcetam (KEPPRA) 500 MG tablet Take 1 tablet (500 mg total) by mouth 2 (two) times daily. 60 tablet 5  . memantine (NAMENDA XR) 28 MG CP24 24 hr capsule Take 1 capsule (28 mg total) by mouth daily. (Patient taking differently: Take 28 mg by mouth every evening. ) 30 capsule 5  . metoprolol succinate (TOPROL-XL) 25 MG 24 hr tablet TAKE (1/2) TABLET BY MOUTH DAILY. (Patient taking differently: Take 12.5 mg by mouth every evening. ) 15 tablet 6  . Multiple Vitamins-Minerals (CENTRUM SILVER PO) Take 1 tablet by mouth every evening.     . NONFORMULARY OR COMPOUNDED ITEM Apply 1 application topically 2 (two) times daily as needed (for inverse psoriasis). Triamcinolone Cream 1%: Silver Sulfadiazine    . PARoxetine (PAXIL) 40 MG tablet Take 40 mg by mouth every evening.     . polyethylene glycol-electrolytes (NULYTELY) 420 g solution As directed (Patient not taking: Reported on 08/22/2019) 4000 mL 0  . polyethylene glycol-electrolytes (TRILYTE) 420 g solution Take 4,000 mLs by mouth as directed. (Patient not taking:  Reported on 08/01/2019) 4000 mL 0  . triamcinolone lotion (KENALOG) 0.1 % Apply 1 application topically 2 (two) times daily as needed. With Silver Sulfadiazine 1%     No current facility-administered medications on file prior to visit.    ALLERGIES: Allergies  Allergen Reactions  . Cyclobenzaprine     TOO STRONG  CAN'T USE AND WORK    FAMILY HISTORY: Family History  Problem Relation Age of Onset  . CAD Father   . CAD Mother   . Hypertension Mother   . Breast cancer Mother   . Hypertension Brother         X 2  . Colon polyps Brother        thinks he is on 10 year surveillance program   . Cancer Maternal Grandmother        breast cancer   . Colon cancer Neg Hx    SOCIAL HISTORY: Social History   Socioeconomic History  .  Marital status: Married    Spouse name: Not on file  . Number of children: 2  . Years of education: Not on file  . Highest education level: High school graduate  Occupational History  . Not on file  Tobacco Use  . Smoking status: Never Smoker  . Smokeless tobacco: Never Used  Substance and Sexual Activity  . Alcohol use: No    Alcohol/week: 0.0 standard drinks  . Drug use: No  . Sexual activity: Not Currently  Other Topics Concern  . Not on file  Social History Narrative  . Not on file   Social Determinants of Health   Financial Resource Strain:   . Difficulty of Paying Living Expenses:   Food Insecurity:   . Worried About Charity fundraiser in the Last Year:   . Arboriculturist in the Last Year:   Transportation Needs:   . Film/video editor (Medical):   Marland Kitchen Lack of Transportation (Non-Medical):   Physical Activity:   . Days of Exercise per Week:   . Minutes of Exercise per Session:   Stress:   . Feeling of Stress :   Social Connections:   . Frequency of Communication with Friends and Family:   . Frequency of Social Gatherings with Friends and Family:   . Attends Religious Services:   . Active Member of Clubs or Organizations:     . Attends Archivist Meetings:   Marland Kitchen Marital Status:   Intimate Partner Violence:   . Fear of Current or Ex-Partner:   . Emotionally Abused:   Marland Kitchen Physically Abused:   . Sexually Abused:     PHYSICAL EXAM: Blood pressure 120/80, pulse 78, resp. rate 18, height 5\' 2"  (1.575 m), weight 131 lb (59.4 kg), SpO2 95 %. General: No acute distress.  Patient appears well-groomed.   Head:  Normocephalic/atraumatic Eyes:  Fundi examined but not visualized Neck: supple, no paraspinal tenderness, full range of motion Heart:  Regular rate and rhythm Lungs:  Clear to auscultation bilaterally Back: No paraspinal tenderness Neurological Exam: alert and oriented to person, place, and time. Attention span and concentration intact, recent and remote memory intact, fund of knowledge intact.  Speech fluent and not dysarthric, language intact.  CN II-XII intact. Bulk and tone normal, muscle strength 5/5 throughout.  Sensation to light touch, temperature and vibration intact.  Deep tendon reflexes 2+ throughout, toes downgoing.  Finger to nose and heel to shin testing intact.  Gait normal, Romberg negative.  IMPRESSION: 1.  Syncope. Unclear etiology.  Unclear if seizure or possible primary cardiac event such as arrhythmia 2.  Metabolic encephalopathy secondary to UTI 2.  History of recurrent episodes of decreased awareness.  Exact etiology unknown but possibly seizure disorder 3.  Alzheimer's dementia  PLAN: 1.  Will increase Keppra to 750mg  twice daily 2. Continue Namenda 3.  Recommend following up with cardiology to determine if extended cardiac event monitor is warranted (or possibly implantable loop recorder as these events seem to occur every 2 to 3 years). 4.  Follow up as already scheduled in July.  Metta Clines, DO  CC: Monico Blitz, MD

## 2019-08-31 ENCOUNTER — Encounter: Payer: Self-pay | Admitting: Neurology

## 2019-08-31 ENCOUNTER — Ambulatory Visit (INDEPENDENT_AMBULATORY_CARE_PROVIDER_SITE_OTHER): Payer: Medicare Other | Admitting: Neurology

## 2019-08-31 ENCOUNTER — Other Ambulatory Visit: Payer: Self-pay

## 2019-08-31 VITALS — BP 120/80 | HR 78 | Resp 18 | Ht 62.0 in | Wt 131.0 lb

## 2019-08-31 DIAGNOSIS — R6889 Other general symptoms and signs: Secondary | ICD-10-CM

## 2019-08-31 DIAGNOSIS — N390 Urinary tract infection, site not specified: Secondary | ICD-10-CM

## 2019-08-31 DIAGNOSIS — G9341 Metabolic encephalopathy: Secondary | ICD-10-CM | POA: Diagnosis not present

## 2019-08-31 DIAGNOSIS — G301 Alzheimer's disease with late onset: Secondary | ICD-10-CM

## 2019-08-31 DIAGNOSIS — F0280 Dementia in other diseases classified elsewhere without behavioral disturbance: Secondary | ICD-10-CM | POA: Diagnosis not present

## 2019-08-31 MED ORDER — LEVETIRACETAM 750 MG PO TABS: 750.0000 mg | ORAL_TABLET | Freq: Two times a day (BID) | ORAL | 5 refills | Status: DC

## 2019-08-31 NOTE — Patient Instructions (Signed)
I have increased levetiracetam to 750mg  twice daily Continue memantine Follow up with cardiology Follow up as already scheduled in July

## 2019-09-03 DIAGNOSIS — F0281 Dementia in other diseases classified elsewhere with behavioral disturbance: Secondary | ICD-10-CM | POA: Diagnosis not present

## 2019-09-03 DIAGNOSIS — R55 Syncope and collapse: Secondary | ICD-10-CM | POA: Diagnosis not present

## 2019-09-03 DIAGNOSIS — R5381 Other malaise: Secondary | ICD-10-CM | POA: Diagnosis not present

## 2019-09-03 DIAGNOSIS — I10 Essential (primary) hypertension: Secondary | ICD-10-CM | POA: Diagnosis not present

## 2019-09-03 DIAGNOSIS — G9341 Metabolic encephalopathy: Secondary | ICD-10-CM | POA: Diagnosis not present

## 2019-09-03 DIAGNOSIS — G309 Alzheimer's disease, unspecified: Secondary | ICD-10-CM | POA: Diagnosis not present

## 2019-09-05 DIAGNOSIS — R5381 Other malaise: Secondary | ICD-10-CM | POA: Diagnosis not present

## 2019-09-05 DIAGNOSIS — I10 Essential (primary) hypertension: Secondary | ICD-10-CM | POA: Diagnosis not present

## 2019-09-05 DIAGNOSIS — G9341 Metabolic encephalopathy: Secondary | ICD-10-CM | POA: Diagnosis not present

## 2019-09-05 DIAGNOSIS — F0281 Dementia in other diseases classified elsewhere with behavioral disturbance: Secondary | ICD-10-CM | POA: Diagnosis not present

## 2019-09-05 DIAGNOSIS — G309 Alzheimer's disease, unspecified: Secondary | ICD-10-CM | POA: Diagnosis not present

## 2019-09-05 DIAGNOSIS — R55 Syncope and collapse: Secondary | ICD-10-CM | POA: Diagnosis not present

## 2019-09-06 ENCOUNTER — Telehealth: Payer: Self-pay | Admitting: Cardiology

## 2019-09-06 ENCOUNTER — Encounter: Payer: Self-pay | Admitting: Cardiology

## 2019-09-06 ENCOUNTER — Telehealth (INDEPENDENT_AMBULATORY_CARE_PROVIDER_SITE_OTHER): Payer: Medicare Other | Admitting: Cardiology

## 2019-09-06 VITALS — BP 150/66 | HR 56 | Ht 60.0 in | Wt 130.0 lb

## 2019-09-06 DIAGNOSIS — R55 Syncope and collapse: Secondary | ICD-10-CM | POA: Diagnosis not present

## 2019-09-06 DIAGNOSIS — I10 Essential (primary) hypertension: Secondary | ICD-10-CM | POA: Diagnosis not present

## 2019-09-06 DIAGNOSIS — Z8679 Personal history of other diseases of the circulatory system: Secondary | ICD-10-CM

## 2019-09-06 NOTE — Patient Instructions (Addendum)
Medication Instructions:    Your physician recommends that you continue on your current medications as directed. Please refer to the Current Medication list given to you today.  Labwork:  NONE  Testing/Procedures: Your physician has recommended that you wear an event monitor. Event monitors are medical devices that record the heart's electrical activity. Doctors most often [place] these monitors to diagnose arrhythmias. Arrhythmias are problems with the speed or rhythm of the heartbeat. The monitor is a small, portable device. You can wear one while you do your normal daily activities. This is usually used to diagnose what is causing palpitations/syncope (passing out).  Follow-Up:  Your physician recommends that you schedule a follow-up appointment in: 3 months (office or virtual).  Any Other Special Instructions Will Be Listed Below (If Applicable).  Please monitor blood pressures at home. If the systolic number (top number) starts to creep up, let us know.  If you need a refill on your cardiac medications before your next appointment, please call your pharmacy.

## 2019-09-06 NOTE — Progress Notes (Signed)
Virtual Visit via Telephone Note   This visit type was conducted due to national recommendations for restrictions regarding the COVID-19 Pandemic (e.g. social distancing) in an effort to limit this patient's exposure and mitigate transmission in our community.  Due to her co-morbid illnesses, this patient is at least at moderate risk for complications without adequate follow up.  This format is felt to be most appropriate for this patient at this time.  The patient did not have access to video technology/had technical difficulties with video requiring transitioning to audio format only (telephone).  All issues noted in this document were discussed and addressed.  No physical exam could be performed with this format.  Please refer to the patient's chart for her  consent to telehealth for [facility].   The patient was identified using 2 identifiers.  Date:  09/06/2019   ID:  Jennifer [name], DOB Jul 08, 1940, MRN [id number]  Patient Location: Home Provider Location: Office  PCP:  Kirstie Peri, MD  Cardiologist:  Nona Dell, MD Electrophysiologist:  None   Evaluation Performed:  Follow-Up Visit  Chief Complaint:  Cardiac follow-up  History of Present Illness:    Jennifer [name] is a 79 y.o. female last assessed via telehealth encounter in February.  I reviewed interval records.  She was recently hospitalized after an episode of apparent syncope.  No acute cardiac cause was clearly documented, high-sensitivity troponin I levels were negative and there were no arrhythmias telemetry.  Echocardiogram showed normal LVEF, brain MRI did not reveal any acute findings.  She was treated for metabolic encephalopathy on top of underlying dementia, also UTI. She was kept on Keppra based on follow-up EEG.  She had a follow-up with her neurologist Dr. Everlena Cooper on March 12 was referred back to [place] to discuss possibility of an implantable loop recorder.  I spoke with the patient's brother Fredrik Cove on the  phone today.  He is very involved in her care and we went over her recent assessment and current status.  He does not report any recurring episodes for Jennifer [name] since hospital discharge.  They are keeping a track of blood pressure since she was taken off Hyzaar, systolics have not been consistently over 150.  I did talk with Fredrik Cove about Dr. Moises Blood suggestion of a possible ILR.  In light of her overall situation, particular with progressive dementia, I would probably start with a 14-day Zio patch just to see if there is any suspicious bradycardia or pauses that might make [place] want to pursue further investigation.  He voiced comfort with this approach as well.   Past Medical History:  Diagnosis Date  . Anxiety   . Borderline diabetes   . Coronary atherosclerosis of native coronary artery    Nonobstructive at catheterization June 2014  . DDD (degenerative disc disease), lumbar   . Depression   . Essential hypertension   . Hyperlipidemia   . Nephrolithiasis   . Psoriatic arthritis (HCC)   . Takotsubo cardiomyopathy    LVEF 60-65% September 2014   Past Surgical History:  Procedure Laterality Date  . APPENDECTOMY    . BACK SURGERY  2014  . CHOLECYSTECTOMY    . LEFT HEART CATHETERIZATION WITH CORONARY ANGIOGRAM N/A 12/18/2012   Procedure: LEFT HEART CATHETERIZATION WITH CORONARY ANGIOGRAM;  Surgeon: Tonny Bollman, MD;  Location: [facility] CATH LAB;  Service: Cardiovascular;  Laterality: N/A;     Current Meds  Medication Sig  . aspirin 81 MG EC tablet Take 1 tablet (81 mg total)  by mouth daily. Swallow whole. (Patient taking differently: Take 81 mg by mouth as needed. Swallow whole.)  . atorvastatin (LIPITOR) 10 MG tablet TAKE 1 TABLET AT 6:00 P.M. (Patient taking differently: Take 10 mg by mouth every evening. )  . Biotin 5000 MCG CAPS Take 10,000 mcg by mouth every evening.   . busPIRone (BUSPAR) 10 MG tablet Take 10 mg by mouth 2 (two) times daily.   . calcium carbonate (OSCAL) 1500 (600  Ca) MG TABS tablet Take 600 mg of elemental calcium by mouth 2 (two) times daily with a meal.  . fish oil-omega-3 fatty acids 1000 MG capsule Take 1 g by mouth every evening.   . levETIRAcetam (KEPPRA) 750 MG tablet Take 1 tablet (750 mg total) by mouth 2 (two) times daily.  . memantine (NAMENDA XR) 28 MG CP24 24 hr capsule Take 1 capsule (28 mg total) by mouth daily. (Patient taking differently: Take 28 mg by mouth every evening. )  . metoprolol succinate (TOPROL-XL) 25 MG 24 hr tablet TAKE (1/2) TABLET BY MOUTH DAILY. (Patient taking differently: Take 12.5 mg by mouth every evening. )  . Multiple Vitamins-Minerals (CENTRUM SILVER PO) Take 1 tablet by mouth every evening.   Marland Kitchen PARoxetine (PAXIL) 40 MG tablet Take 40 mg by mouth every evening.      Allergies:   Cyclobenzaprine   ROS:   Not able to obtain directly from patient.   Prior CV studies:   The following studies were reviewed today:  Echocardiogram 08/23/2019: 1. Left ventricular ejection fraction, by estimation, is 60 to 65%. The  left ventricle has normal function. The left ventricle has no regional  wall motion abnormalities. Left ventricular diastolic parameters are  consistent with Grade I diastolic  dysfunction (impaired relaxation).  2. Right ventricular systolic function is normal. The right ventricular  size is normal.  3. The mitral valve is normal in structure and function. No evidence of  mitral valve regurgitation. No evidence of mitral stenosis.  4. The aortic valve is tricuspid. Aortic valve regurgitation is not  visualized. No aortic stenosis is present.  5. The inferior vena cava IVC is small, suggesting low RA pressure.  Labs/Other Tests and Data Reviewed:    EKG:  An ECG dated 08/22/2019 was personally reviewed today and demonstrated:  Sinus rhythm with LVH, left anterior fascicular block.  Recent Labs: 08/23/2019: ALT 28; BUN 24; Creatinine, Ser 0.68; Hemoglobin 14.0; Platelets 160; Potassium 3.4; Sodium  139; TSH 2.385   Recent Lipid Panel Lab Results  Component Value Date/Time   CHOL 152 12/16/2012 05:20 AM   TRIG 34 12/16/2012 05:20 AM   HDL 57 12/16/2012 05:20 AM   CHOLHDL 2.7 12/16/2012 05:20 AM   LDLCALC 88 12/16/2012 05:20 AM    Wt Readings from Last 3 Encounters:  09/06/19 130 lb (59 kg)  08/31/19 131 lb (59.4 kg)  08/22/19 126 lb 15.8 oz (57.6 kg)     Objective:    Vital Signs:  BP (!) 150/66   Pulse (!) 56   Ht 5' (1.524 m)   Wt 130 lb (59 kg)   BMI 25.39 kg/m    I spoke exclusively with the patient's brother Fredrik Cove during the encounter.  ASSESSMENT & PLAN:    1.  Episode of apparent syncope as described above.  At time of hospital evaluation there were no acute cardiac events documented.  I reviewed her testing.  After discussion with patient's brother Fredrik Cove, plan is to place a 14-day Zio patch AT.  I would not necessarily pursue an ILR at this time unless we see any suspicious bradycardia or pauses on her Zio patch.  2.  Essential hypertension, patient was taken off Hyzaar at hospital assessment.  Blood pressure being tracked at home.  If systolics trend up and are consistently over 150, we might resume low-dose losartan alone.  3.  History of stress-induced cardiomyopathy, follow-up echocardiogram done recently showed normal LVEF at 60 to 65%.  Time:   Today, I have spent 7 minutes with the patient with telehealth technology discussing the above problems.     Medication Adjustments/Labs and Tests Ordered: Current medicines are reviewed at length with the patient today.  Concerns regarding medicines are outlined above.   Tests Ordered: Orders Placed This Encounter  Procedures  . LONG TERM MONITOR (3-14 DAYS)    Medication Changes: No orders of the defined types were placed in this encounter.   Follow Up:  Either In Person or Virtual 3 months.  Signed, Rozann Lesches, MD  09/06/2019 11:20 AM    [place]

## 2019-09-06 NOTE — Telephone Encounter (Signed)
Pre-cert Verification for the following procedure    14 Day ZIO AT dx: history of syncope

## 2019-09-07 DIAGNOSIS — R5381 Other malaise: Secondary | ICD-10-CM | POA: Diagnosis not present

## 2019-09-07 DIAGNOSIS — G9341 Metabolic encephalopathy: Secondary | ICD-10-CM | POA: Diagnosis not present

## 2019-09-07 DIAGNOSIS — R55 Syncope and collapse: Secondary | ICD-10-CM | POA: Diagnosis not present

## 2019-09-07 DIAGNOSIS — I10 Essential (primary) hypertension: Secondary | ICD-10-CM | POA: Diagnosis not present

## 2019-09-07 DIAGNOSIS — F0281 Dementia in other diseases classified elsewhere with behavioral disturbance: Secondary | ICD-10-CM | POA: Diagnosis not present

## 2019-09-07 DIAGNOSIS — G309 Alzheimer's disease, unspecified: Secondary | ICD-10-CM | POA: Diagnosis not present

## 2019-09-10 DIAGNOSIS — F0281 Dementia in other diseases classified elsewhere with behavioral disturbance: Secondary | ICD-10-CM | POA: Diagnosis not present

## 2019-09-10 DIAGNOSIS — I10 Essential (primary) hypertension: Secondary | ICD-10-CM | POA: Diagnosis not present

## 2019-09-10 DIAGNOSIS — G309 Alzheimer's disease, unspecified: Secondary | ICD-10-CM | POA: Diagnosis not present

## 2019-09-10 DIAGNOSIS — R55 Syncope and collapse: Secondary | ICD-10-CM | POA: Diagnosis not present

## 2019-09-10 DIAGNOSIS — G9341 Metabolic encephalopathy: Secondary | ICD-10-CM | POA: Diagnosis not present

## 2019-09-10 DIAGNOSIS — R5381 Other malaise: Secondary | ICD-10-CM | POA: Diagnosis not present

## 2019-09-11 ENCOUNTER — Ambulatory Visit (INDEPENDENT_AMBULATORY_CARE_PROVIDER_SITE_OTHER): Payer: Medicare Other

## 2019-09-11 DIAGNOSIS — R55 Syncope and collapse: Secondary | ICD-10-CM

## 2019-09-11 DIAGNOSIS — Z789 Other specified health status: Secondary | ICD-10-CM | POA: Diagnosis not present

## 2019-09-11 DIAGNOSIS — Z299 Encounter for prophylactic measures, unspecified: Secondary | ICD-10-CM | POA: Diagnosis not present

## 2019-09-11 DIAGNOSIS — E1165 Type 2 diabetes mellitus with hyperglycemia: Secondary | ICD-10-CM | POA: Diagnosis not present

## 2019-09-11 DIAGNOSIS — I10 Essential (primary) hypertension: Secondary | ICD-10-CM | POA: Diagnosis not present

## 2019-09-11 DIAGNOSIS — I739 Peripheral vascular disease, unspecified: Secondary | ICD-10-CM | POA: Diagnosis not present

## 2019-09-12 DIAGNOSIS — G9341 Metabolic encephalopathy: Secondary | ICD-10-CM | POA: Diagnosis not present

## 2019-09-12 DIAGNOSIS — R5381 Other malaise: Secondary | ICD-10-CM | POA: Diagnosis not present

## 2019-09-12 DIAGNOSIS — G309 Alzheimer's disease, unspecified: Secondary | ICD-10-CM | POA: Diagnosis not present

## 2019-09-12 DIAGNOSIS — F0281 Dementia in other diseases classified elsewhere with behavioral disturbance: Secondary | ICD-10-CM | POA: Diagnosis not present

## 2019-09-12 DIAGNOSIS — I10 Essential (primary) hypertension: Secondary | ICD-10-CM | POA: Diagnosis not present

## 2019-09-12 DIAGNOSIS — R55 Syncope and collapse: Secondary | ICD-10-CM | POA: Diagnosis not present

## 2019-09-14 DIAGNOSIS — Z23 Encounter for immunization: Secondary | ICD-10-CM | POA: Diagnosis not present

## 2019-09-17 DIAGNOSIS — G309 Alzheimer's disease, unspecified: Secondary | ICD-10-CM | POA: Diagnosis not present

## 2019-09-17 DIAGNOSIS — R5381 Other malaise: Secondary | ICD-10-CM | POA: Diagnosis not present

## 2019-09-17 DIAGNOSIS — R55 Syncope and collapse: Secondary | ICD-10-CM | POA: Diagnosis not present

## 2019-09-17 DIAGNOSIS — I10 Essential (primary) hypertension: Secondary | ICD-10-CM | POA: Diagnosis not present

## 2019-09-17 DIAGNOSIS — F0281 Dementia in other diseases classified elsewhere with behavioral disturbance: Secondary | ICD-10-CM | POA: Diagnosis not present

## 2019-09-17 DIAGNOSIS — G9341 Metabolic encephalopathy: Secondary | ICD-10-CM | POA: Diagnosis not present

## 2019-09-21 ENCOUNTER — Emergency Department (HOSPITAL_COMMUNITY)
Admission: EM | Admit: 2019-09-21 | Discharge: 2019-09-22 | Disposition: A | Discharge status: Home / Self Care | Payer: Medicare Other | Source: Home / Self Care | Attending: Emergency Medicine | Admitting: Emergency Medicine

## 2019-09-21 ENCOUNTER — Other Ambulatory Visit: Payer: Self-pay

## 2019-09-21 ENCOUNTER — Emergency Department (HOSPITAL_COMMUNITY): Payer: Medicare Other

## 2019-09-21 ENCOUNTER — Encounter (HOSPITAL_COMMUNITY): Payer: Self-pay

## 2019-09-21 DIAGNOSIS — R4182 Altered mental status, unspecified: Secondary | ICD-10-CM | POA: Insufficient documentation

## 2019-09-21 DIAGNOSIS — R569 Unspecified convulsions: Secondary | ICD-10-CM | POA: Insufficient documentation

## 2019-09-21 DIAGNOSIS — R05 Cough: Secondary | ICD-10-CM | POA: Diagnosis not present

## 2019-09-21 DIAGNOSIS — I444 Left anterior fascicular block: Secondary | ICD-10-CM | POA: Diagnosis not present

## 2019-09-21 DIAGNOSIS — I119 Hypertensive heart disease without heart failure: Secondary | ICD-10-CM | POA: Insufficient documentation

## 2019-09-21 DIAGNOSIS — Z7984 Long term (current) use of oral hypoglycemic drugs: Secondary | ICD-10-CM | POA: Diagnosis not present

## 2019-09-21 DIAGNOSIS — R4189 Other symptoms and signs involving cognitive functions and awareness: Secondary | ICD-10-CM

## 2019-09-21 DIAGNOSIS — Z7982 Long term (current) use of aspirin: Secondary | ICD-10-CM | POA: Diagnosis not present

## 2019-09-21 DIAGNOSIS — F0281 Dementia in other diseases classified elsewhere with behavioral disturbance: Secondary | ICD-10-CM | POA: Diagnosis not present

## 2019-09-21 DIAGNOSIS — Z03818 Encounter for observation for suspected exposure to other biological agents ruled out: Secondary | ICD-10-CM | POA: Diagnosis not present

## 2019-09-21 DIAGNOSIS — E119 Type 2 diabetes mellitus without complications: Secondary | ICD-10-CM | POA: Diagnosis not present

## 2019-09-21 DIAGNOSIS — R531 Weakness: Secondary | ICD-10-CM | POA: Diagnosis not present

## 2019-09-21 DIAGNOSIS — G309 Alzheimer's disease, unspecified: Secondary | ICD-10-CM | POA: Insufficient documentation

## 2019-09-21 DIAGNOSIS — E785 Hyperlipidemia, unspecified: Secondary | ICD-10-CM | POA: Diagnosis not present

## 2019-09-21 DIAGNOSIS — I2510 Atherosclerotic heart disease of native coronary artery without angina pectoris: Secondary | ICD-10-CM | POA: Diagnosis not present

## 2019-09-21 DIAGNOSIS — R464 Slowness and poor responsiveness: Secondary | ICD-10-CM | POA: Diagnosis not present

## 2019-09-21 DIAGNOSIS — I10 Essential (primary) hypertension: Secondary | ICD-10-CM | POA: Diagnosis not present

## 2019-09-21 DIAGNOSIS — R404 Transient alteration of awareness: Secondary | ICD-10-CM | POA: Diagnosis not present

## 2019-09-21 DIAGNOSIS — G4710 Hypersomnia, unspecified: Secondary | ICD-10-CM | POA: Diagnosis not present

## 2019-09-21 DIAGNOSIS — Z20822 Contact with and (suspected) exposure to covid-19: Secondary | ICD-10-CM | POA: Diagnosis not present

## 2019-09-21 DIAGNOSIS — G40909 Epilepsy, unspecified, not intractable, without status epilepticus: Secondary | ICD-10-CM | POA: Diagnosis not present

## 2019-09-21 DIAGNOSIS — Z79899 Other long term (current) drug therapy: Secondary | ICD-10-CM | POA: Diagnosis not present

## 2019-09-21 DIAGNOSIS — R001 Bradycardia, unspecified: Secondary | ICD-10-CM | POA: Diagnosis not present

## 2019-09-21 DIAGNOSIS — Z888 Allergy status to other drugs, medicaments and biological substances status: Secondary | ICD-10-CM | POA: Diagnosis not present

## 2019-09-21 LAB — CBC WITH DIFFERENTIAL/PLATELET
Abs Immature Granulocytes: 0 10*3/uL (ref 0.00–0.07)
Basophils Absolute: 0.1 10*3/uL (ref 0.0–0.1)
Basophils Relative: 1 %
Eosinophils Absolute: 0.3 10*3/uL (ref 0.0–0.5)
Eosinophils Relative: 5 %
HCT: 43 % (ref 36.0–46.0)
Hemoglobin: 14 g/dL (ref 12.0–15.0)
Immature Granulocytes: 0 %
Lymphocytes Relative: 56 %
Lymphs Abs: 3.7 10*3/uL (ref 0.7–4.0)
MCH: 33.2 pg (ref 26.0–34.0)
MCHC: 32.6 g/dL (ref 30.0–36.0)
MCV: 101.9 fL — ABNORMAL HIGH (ref 80.0–100.0)
Monocytes Absolute: 0.7 10*3/uL (ref 0.1–1.0)
Monocytes Relative: 11 %
Neutro Abs: 1.8 10*3/uL (ref 1.7–7.7)
Neutrophils Relative %: 27 %
Platelets: 168 10*3/uL (ref 150–400)
RBC: 4.22 MIL/uL (ref 3.87–5.11)
RDW: 12.5 % (ref 11.5–15.5)
WBC: 6.5 10*3/uL (ref 4.0–10.5)
nRBC: 0 % (ref 0.0–0.2)

## 2019-09-21 LAB — COMPREHENSIVE METABOLIC PANEL
ALT: 16 U/L (ref 0–44)
AST: 19 U/L (ref 15–41)
Albumin: 3.4 g/dL — ABNORMAL LOW (ref 3.5–5.0)
Alkaline Phosphatase: 49 U/L (ref 38–126)
Anion gap: 7 (ref 5–15)
BUN: 18 mg/dL (ref 8–23)
CO2: 30 mmol/L (ref 22–32)
Calcium: 8.8 mg/dL — ABNORMAL LOW (ref 8.9–10.3)
Chloride: 101 mmol/L (ref 98–111)
Creatinine, Ser: 0.69 mg/dL (ref 0.44–1.00)
GFR calc Af Amer: >60 mL/min (ref >60)
GFR calc non Af Amer: >60 mL/min (ref >60)
Glucose, Bld: 125 mg/dL — ABNORMAL HIGH (ref 70–99)
Potassium: 3.5 mmol/L (ref 3.5–5.1)
Sodium: 138 mmol/L (ref 135–145)
Total Bilirubin: 0.4 mg/dL (ref 0.3–1.2)
Total Protein: 6.4 g/dL — ABNORMAL LOW (ref 6.5–8.1)

## 2019-09-21 NOTE — ED Provider Notes (Signed)
[facility] EMERGENCY DEPARTMENT Provider Note   CSN: [id number] Arrival date & time: 09/21/19  2246     History Chief Complaint  Patient presents with  . Altered Mental Status    Jennifer [name] is a 79 y.o. female.  Pt's family called EMS because pt was less responsive.  Pt has a history of dementia.  Pt is reported to have been on the sofa and family was unable to wake her up.  Pt had a similar episode last month and is suppose to have a cardiac monitor placed.   The history is provided by the patient. No language interpreter was used.  Altered Mental Status Severity:  Moderate Episode history:  Single Timing:  Constant Chronicity:  New      Past Medical History:  Diagnosis Date  . Anxiety   . Borderline diabetes   . Coronary atherosclerosis of native coronary artery    Nonobstructive at catheterization June 2014  . DDD (degenerative disc disease), lumbar   . Depression   . Essential hypertension   . Hyperlipidemia   . Nephrolithiasis   . Psoriatic arthritis (HCC)   . Takotsubo cardiomyopathy    LVEF 60-65% September 2014    Patient Active Problem List   Diagnosis Date Noted  . Seizure (HCC)   . Alzheimer's dementia with behavioral disturbance (HCC)   . Syncope and collapse 08/23/2019  . Rectal bleeding 03/14/2019  . Altered awareness, transient 01/06/2015  . Memory loss 01/29/2014  . Coronary atherosclerosis of native coronary artery 03/15/2013  . HLD (hyperlipidemia) 01/11/2013  . Essential hypertension 01/11/2013  . Takotsubo syndrome 12/19/2012  . Nephrolithiasis 12/16/2012  . Type II or unspecified type diabetes mellitus without mention of complication, not stated as uncontrolled 12/16/2012  . Other and unspecified hyperlipidemia 12/16/2012  . Psoriatic arthritis (HCC) 12/16/2012    Past Surgical History:  Procedure Laterality Date  . APPENDECTOMY    . BACK SURGERY  2014  . CHOLECYSTECTOMY    . LEFT HEART CATHETERIZATION WITH CORONARY  ANGIOGRAM N/A 12/18/2012   Procedure: LEFT HEART CATHETERIZATION WITH CORONARY ANGIOGRAM;  Surgeon: Tonny Bollman, MD;  Location: [facility] CATH LAB;  Service: Cardiovascular;  Laterality: N/A;     OB History   No obstetric history on file.     Family History  Problem Relation Age of Onset  . CAD Father   . CAD Mother   . Hypertension Mother   . Breast cancer Mother   . Hypertension Brother         X 2  . Colon polyps Brother        thinks he is on 10 year surveillance program   . Cancer Maternal Grandmother        breast cancer   . Colon cancer Neg Hx     Social History   Tobacco Use  . Smoking status: Never Smoker  . Smokeless tobacco: Never Used  Substance Use Topics  . Alcohol use: No    Alcohol/week: 0.0 standard drinks  . Drug use: No    Home Medications Prior to Admission medications   Medication Sig Start Date End Date Taking? Authorizing Provider  aspirin 81 MG EC tablet Take 1 tablet (81 mg total) by mouth daily. Swallow whole. 12/19/12   Calvert Cantor, MD  atorvastatin (LIPITOR) 10 MG tablet TAKE 1 TABLET AT 6:00 P.M. Patient taking differently: Take 10 mg by mouth every evening.  04/14/15   Jonelle Sidle, MD  Biotin 5000 MCG CAPS Take 5,000 mcg  by mouth every evening.     [provider]  busPIRone (BUSPAR) 10 MG tablet Take 10 mg by mouth 2 (two) times daily.  03/02/19   [provider]  calcium carbonate (OSCAL) 1500 (600 Ca) MG TABS tablet Take 600 mg of elemental calcium by mouth 2 (two) times daily with a meal.    [provider]  clobetasol cream (TEMOVATE) 0.05 % Apply 1 application topically 2 (two) times daily as needed (for eczema).     [provider]  clonazePAM (KLONOPIN) 0.5 MG tablet Take 0.25-0.5 mg by mouth daily as needed for anxiety.  11/30/18   [provider]  fish oil-omega-3 fatty acids 1000 MG capsule Take 1 g by mouth every evening.     [provider]  hydroxypropyl methylcellulose /  hypromellose (ISOPTO TEARS / GONIOVISC) 2.5 % ophthalmic solution Place 1 drop into the left eye daily as needed for dry eyes.     [provider]  Ixekizumab (TALTZ) 80 MG/ML SOSY Inject 1 Syringe into the skin every 28 (twenty-eight) days.  05/16/18   [provider]  levETIRAcetam (KEPPRA) 750 MG tablet Take 1 tablet (750 mg total) by mouth 2 (two) times daily. 08/31/19   Drema Dallas, DO  memantine (NAMENDA XR) 28 MG CP24 24 hr capsule Take 1 capsule (28 mg total) by mouth daily. Patient taking differently: Take 28 mg by mouth every evening.  04/12/17   Everlena Cooper, Adam R, DO  metoprolol succinate (TOPROL-XL) 25 MG 24 hr tablet TAKE (1/2) TABLET BY MOUTH DAILY. Patient taking differently: Take 12.5 mg by mouth every evening.  07/30/15   Jonelle Sidle, MD  Multiple Vitamins-Minerals (CENTRUM SILVER PO) Take 1 tablet by mouth every evening.     [provider]  NONFORMULARY OR COMPOUNDED ITEM Apply 1 application topically 2 (two) times daily as needed (for inverse psoriasis). Triamcinolone Cream 1%: Silver Sulfadiazine    [provider]  PARoxetine (PAXIL) 40 MG tablet Take 40 mg by mouth every evening.     [provider]    Allergies    Cyclobenzaprine  Review of Systems   Review of Systems  Unable to perform ROS: Dementia    Physical Exam Updated Vital Signs BP (!) 195/74 (BP Location: Left Arm)   Pulse (!) 56   Temp 98.4 F (36.9 C) (Oral)   Resp 15   Ht 5' (1.524 m)   Wt 59 kg   SpO2 94%   BMI 25.39 kg/m   Physical Exam Vitals and nursing note reviewed.  Constitutional:      Appearance: She is well-developed.     Comments: Pt answers questions, Pt falls asleep while talking  HENT:     Head: Normocephalic.     Mouth/Throat:     Mouth: Mucous membranes are moist.  Eyes:     Extraocular Movements: Extraocular movements intact.     Pupils: Pupils are equal, round, and reactive to light.  Cardiovascular:     Rate and Rhythm:  Normal rate.  Pulmonary:     Effort: Pulmonary effort is normal.  Abdominal:     General: Abdomen is flat. There is no distension.  Musculoskeletal:        General: Normal range of motion.     Cervical back: Normal range of motion.     Comments: Lidocaine patch bilat low back.  No skin breakdown   Skin:    General: Skin is warm.  Neurological:  General: No focal deficit present.     Mental Status: She is alert.  Psychiatric:        Mood and Affect: Mood normal.     Comments: sleepy     ED Results / Procedures / Treatments   Labs (all labs ordered are listed, but only abnormal results are displayed) Labs Reviewed  CBC WITH DIFFERENTIAL/PLATELET  COMPREHENSIVE METABOLIC PANEL  URINALYSIS, ROUTINE W REFLEX MICROSCOPIC    EKG None  Radiology No results found.  Procedures Procedures (including critical care time)  Medications Ordered in ED Medications - No data to display  ED Course  I have reviewed the triage vital signs and the nursing notes.  Pertinent labs & imaging results that were available during my care of the patient were reviewed by me and considered in my medical decision making (see chart for details).    MDM Rules/Calculators/A&P                     MDM: Pt ct is normal  Chest xray is normal.  UA and troponin pending.  Pt's care turned over to Dr. Tomi Bamberger with labs pending  Final Clinical Impression(s) / ED Diagnoses Final diagnoses:  Altered mental status, unspecified altered mental status type    Rx / DC Orders ED Discharge Orders    None       Sidney Ace 09/22/19 0115    Rolland Porter, MD 09/22/19 [phone]

## 2019-09-21 NOTE — ED Triage Notes (Signed)
Pt's family called ems because the patient fell asleep on the couch and they couldn't wake her.  Pt answers questions with some prodding, states "they treat me like a dog and don't let me sleep".  Pt states she hurts all over but denies new pain or injury.

## 2019-09-22 ENCOUNTER — Other Ambulatory Visit: Payer: Self-pay

## 2019-09-22 ENCOUNTER — Observation Stay (HOSPITAL_COMMUNITY)
Admission: EM | Admit: 2019-09-22 | Discharge: 2019-09-23 | Disposition: A | Discharge status: Home / Self Care | Payer: Medicare Other | Attending: Internal Medicine | Admitting: Internal Medicine

## 2019-09-22 ENCOUNTER — Encounter (HOSPITAL_COMMUNITY): Payer: Self-pay

## 2019-09-22 DIAGNOSIS — Z7984 Long term (current) use of oral hypoglycemic drugs: Secondary | ICD-10-CM | POA: Insufficient documentation

## 2019-09-22 DIAGNOSIS — Z79899 Other long term (current) drug therapy: Secondary | ICD-10-CM | POA: Insufficient documentation

## 2019-09-22 DIAGNOSIS — I4891 Unspecified atrial fibrillation: Secondary | ICD-10-CM | POA: Diagnosis not present

## 2019-09-22 DIAGNOSIS — E119 Type 2 diabetes mellitus without complications: Secondary | ICD-10-CM | POA: Insufficient documentation

## 2019-09-22 DIAGNOSIS — Z888 Allergy status to other drugs, medicaments and biological substances status: Secondary | ICD-10-CM | POA: Insufficient documentation

## 2019-09-22 DIAGNOSIS — G40909 Epilepsy, unspecified, not intractable, without status epilepticus: Secondary | ICD-10-CM | POA: Insufficient documentation

## 2019-09-22 DIAGNOSIS — F03918 Unspecified dementia, unspecified severity, with other behavioral disturbance: Secondary | ICD-10-CM

## 2019-09-22 DIAGNOSIS — G309 Alzheimer's disease, unspecified: Secondary | ICD-10-CM | POA: Insufficient documentation

## 2019-09-22 DIAGNOSIS — R0902 Hypoxemia: Secondary | ICD-10-CM | POA: Diagnosis not present

## 2019-09-22 DIAGNOSIS — I2510 Atherosclerotic heart disease of native coronary artery without angina pectoris: Secondary | ICD-10-CM | POA: Insufficient documentation

## 2019-09-22 DIAGNOSIS — Z7982 Long term (current) use of aspirin: Secondary | ICD-10-CM | POA: Insufficient documentation

## 2019-09-22 DIAGNOSIS — F0281 Dementia in other diseases classified elsewhere with behavioral disturbance: Secondary | ICD-10-CM | POA: Insufficient documentation

## 2019-09-22 DIAGNOSIS — Z03818 Encounter for observation for suspected exposure to other biological agents ruled out: Secondary | ICD-10-CM | POA: Diagnosis not present

## 2019-09-22 DIAGNOSIS — G4710 Hypersomnia, unspecified: Secondary | ICD-10-CM | POA: Insufficient documentation

## 2019-09-22 DIAGNOSIS — R0689 Other abnormalities of breathing: Secondary | ICD-10-CM | POA: Diagnosis not present

## 2019-09-22 DIAGNOSIS — F0391 Unspecified dementia with behavioral disturbance: Secondary | ICD-10-CM

## 2019-09-22 DIAGNOSIS — R404 Transient alteration of awareness: Secondary | ICD-10-CM | POA: Diagnosis not present

## 2019-09-22 DIAGNOSIS — G9340 Encephalopathy, unspecified: Secondary | ICD-10-CM

## 2019-09-22 DIAGNOSIS — R4020 Unspecified coma: Secondary | ICD-10-CM | POA: Diagnosis not present

## 2019-09-22 DIAGNOSIS — I10 Essential (primary) hypertension: Secondary | ICD-10-CM | POA: Insufficient documentation

## 2019-09-22 DIAGNOSIS — R4182 Altered mental status, unspecified: Secondary | ICD-10-CM | POA: Diagnosis not present

## 2019-09-22 DIAGNOSIS — E785 Hyperlipidemia, unspecified: Secondary | ICD-10-CM | POA: Insufficient documentation

## 2019-09-22 DIAGNOSIS — Z20822 Contact with and (suspected) exposure to covid-19: Secondary | ICD-10-CM | POA: Insufficient documentation

## 2019-09-22 DIAGNOSIS — I444 Left anterior fascicular block: Secondary | ICD-10-CM | POA: Diagnosis not present

## 2019-09-22 HISTORY — DX: Dementia in other diseases classified elsewhere, unspecified severity, without behavioral disturbance, psychotic disturbance, mood disturbance, and anxiety: F02.80

## 2019-09-22 LAB — RAPID URINE DRUG SCREEN, HOSP PERFORMED
Amphetamines: NOT DETECTED
Barbiturates: NOT DETECTED
Benzodiazepines: NOT DETECTED
Cocaine: NOT DETECTED
Opiates: NOT DETECTED
Tetrahydrocannabinol: NOT DETECTED

## 2019-09-22 LAB — URINALYSIS, ROUTINE W REFLEX MICROSCOPIC
Bilirubin Urine: NEGATIVE
Bilirubin Urine: NEGATIVE
Glucose, UA: NEGATIVE mg/dL
Glucose, UA: NEGATIVE mg/dL
Hgb urine dipstick: NEGATIVE
Hgb urine dipstick: NEGATIVE
Ketones, ur: NEGATIVE mg/dL
Ketones, ur: NEGATIVE mg/dL
Leukocytes,Ua: NEGATIVE
Leukocytes,Ua: NEGATIVE
Nitrite: NEGATIVE
Nitrite: NEGATIVE
Protein, ur: NEGATIVE mg/dL
Protein, ur: NEGATIVE mg/dL
Specific Gravity, Urine: 1.017 (ref 1.005–1.030)
Specific Gravity, Urine: 1.012 (ref 1.005–1.030)
pH: 6 (ref 5.0–8.0)
pH: 7 (ref 5.0–8.0)

## 2019-09-22 LAB — COMPREHENSIVE METABOLIC PANEL
ALT: 16 U/L (ref 0–44)
AST: 21 U/L (ref 15–41)
Albumin: 3.3 g/dL — ABNORMAL LOW (ref 3.5–5.0)
Alkaline Phosphatase: 44 U/L (ref 38–126)
Anion gap: 5 (ref 5–15)
BUN: 15 mg/dL (ref 8–23)
CO2: 32 mmol/L (ref 22–32)
Calcium: 8.9 mg/dL (ref 8.9–10.3)
Chloride: 103 mmol/L (ref 98–111)
Creatinine, Ser: 0.59 mg/dL (ref 0.44–1.00)
GFR calc Af Amer: >60 mL/min (ref >60)
GFR calc non Af Amer: >60 mL/min (ref >60)
Glucose, Bld: 111 mg/dL — ABNORMAL HIGH (ref 70–99)
Potassium: 4.2 mmol/L (ref 3.5–5.1)
Sodium: 140 mmol/L (ref 135–145)
Total Bilirubin: 0.7 mg/dL (ref 0.3–1.2)
Total Protein: 6.1 g/dL — ABNORMAL LOW (ref 6.5–8.1)

## 2019-09-22 LAB — CBC
HCT: 42.3 % (ref 36.0–46.0)
Hemoglobin: 14 g/dL (ref 12.0–15.0)
MCH: 33.3 pg (ref 26.0–34.0)
MCHC: 33.1 g/dL (ref 30.0–36.0)
MCV: 100.7 fL — ABNORMAL HIGH (ref 80.0–100.0)
Platelets: 160 10*3/uL (ref 150–400)
RBC: 4.2 MIL/uL (ref 3.87–5.11)
RDW: 12.4 % (ref 11.5–15.5)
WBC: 6.3 10*3/uL (ref 4.0–10.5)
nRBC: 0 % (ref 0.0–0.2)

## 2019-09-22 LAB — TROPONIN I (HIGH SENSITIVITY)
Troponin I (High Sensitivity): 11 ng/L (ref <18)
Troponin I (High Sensitivity): 12 ng/L (ref <18)

## 2019-09-22 LAB — ETHANOL: Alcohol, Ethyl (B): <10 mg/dL (ref <10)

## 2019-09-22 MED ORDER — HALOPERIDOL LACTATE 5 MG/ML IJ SOLN: 5.0000 mg | Freq: Three times a day (TID) | INTRAMUSCULAR | Status: DC | PRN

## 2019-09-22 MED ORDER — ATORVASTATIN CALCIUM 10 MG PO TABS
10.0000 mg | ORAL_TABLET | Freq: Every evening | ORAL | Status: DC
  Administered 2019-09-22: 21:00:00 10 mg via ORAL
  Filled 2019-09-22 (×2): qty 1

## 2019-09-22 MED ORDER — METOPROLOL SUCCINATE ER 25 MG PO TB24
12.5000 mg | ORAL_TABLET | Freq: Every day | ORAL | Status: DC
  Administered 2019-09-22 – 2019-09-23 (×2): 12.5 mg via ORAL
  Filled 2019-09-22 (×2): qty 1

## 2019-09-22 MED ORDER — ONDANSETRON HCL 4 MG/2ML IJ SOLN: 4.0000 mg | Freq: Four times a day (QID) | INTRAMUSCULAR | Status: DC | PRN

## 2019-09-22 MED ORDER — POTASSIUM CHLORIDE 20 MEQ PO PACK
40.0000 meq | PACK | Freq: Once | ORAL | Status: AC
  Administered 2019-09-22: 21:00:00 40 meq via ORAL
  Filled 2019-09-22: qty 2

## 2019-09-22 MED ORDER — ACETAMINOPHEN 650 MG RE SUPP: 650.0000 mg | Freq: Four times a day (QID) | RECTAL | Status: DC | PRN

## 2019-09-22 MED ORDER — ENOXAPARIN SODIUM 40 MG/0.4ML ~~LOC~~ SOLN
40.0000 mg | SUBCUTANEOUS | Status: DC
  Administered 2019-09-22: 21:00:00 40 mg via SUBCUTANEOUS
  Filled 2019-09-22: qty 0.4

## 2019-09-22 MED ORDER — MEMANTINE HCL ER 28 MG PO CP24
28.0000 mg | ORAL_CAPSULE | Freq: Every evening | ORAL | Status: DC
  Administered 2019-09-22: 28 mg via ORAL
  Filled 2019-09-22 (×2): qty 1

## 2019-09-22 MED ORDER — ACETAMINOPHEN 325 MG PO TABS: 650.0000 mg | ORAL_TABLET | Freq: Four times a day (QID) | ORAL | Status: DC | PRN

## 2019-09-22 MED ORDER — HYDRALAZINE HCL 20 MG/ML IJ SOLN: 10.0000 mg | INTRAMUSCULAR | Status: DC | PRN

## 2019-09-22 MED ORDER — LOSARTAN POTASSIUM 50 MG PO TABS: 25.0000 mg | ORAL_TABLET | Freq: Every day | ORAL | Status: DC

## 2019-09-22 MED ORDER — FLUMAZENIL 0.5 MG/5ML IV SOLN
0.2000 mg | Freq: Once | INTRAVENOUS | Status: AC
  Administered 2019-09-22: 0.2 mg via INTRAVENOUS
  Filled 2019-09-22: qty 5

## 2019-09-22 MED ORDER — LEVETIRACETAM 1000 MG PO TABS: 1000.0000 mg | ORAL_TABLET | Freq: Two times a day (BID) | ORAL | 0 refills | Status: DC

## 2019-09-22 MED ORDER — POLYETHYLENE GLYCOL 3350 17 G PO PACK: 17.0000 g | PACK | Freq: Every day | ORAL | Status: DC | PRN

## 2019-09-22 MED ORDER — BUSPIRONE HCL 5 MG PO TABS
10.0000 mg | ORAL_TABLET | Freq: Two times a day (BID) | ORAL | Status: DC
  Administered 2019-09-22 – 2019-09-23 (×2): 10 mg via ORAL
  Filled 2019-09-22 (×2): qty 2

## 2019-09-22 MED ORDER — POTASSIUM CHLORIDE IN NACL 20-0.9 MEQ/L-% IV SOLN: INTRAVENOUS | Status: AC

## 2019-09-22 MED ORDER — ONDANSETRON HCL 4 MG PO TABS: 4.0000 mg | ORAL_TABLET | Freq: Four times a day (QID) | ORAL | Status: DC | PRN

## 2019-09-22 MED ORDER — LEVETIRACETAM 500 MG PO TABS
1000.0000 mg | ORAL_TABLET | Freq: Two times a day (BID) | ORAL | Status: DC
  Administered 2019-09-22 – 2019-09-23 (×2): 1000 mg via ORAL
  Filled 2019-09-22 (×2): qty 2

## 2019-09-22 NOTE — ED Notes (Signed)
Pur-wick in place °

## 2019-09-22 NOTE — Discharge Instructions (Addendum)
I discussed your episode tonight with both a cardiologist and a neurologist and actually the hospitalist who had admitted you in March.  The cardiologist is go to let Dr. Diona Browner know that you had to come to the ED after the monitor was removed.  He may want to place another monitor.  The neurologist recommends that we change your Keppra/levetiracetam to 1000 mg twice a day and to follow-up with Dr. Everlena Cooper soon in the office.  Your test tonight were otherwise normal.

## 2019-09-22 NOTE — ED Notes (Signed)
After romazicon, pt is more alert

## 2019-09-22 NOTE — ED Notes (Signed)
Seen last night and released  In after complaint of unresponsive   More alert after Dr Judie Petit spoke with pt during a physicial assessment   He asked if she might have taken too much meds  Pt replied "maybe"

## 2019-09-22 NOTE — ED Notes (Signed)
Dr Rubin Payor in to discuss findings

## 2019-09-22 NOTE — ED Provider Notes (Signed)
  Physical Exam  BP (!) 151/86   Pulse (!) 53   Temp 97.6 F (36.4 C) (Oral)   Resp 18   Wt 66.2 kg   SpO2 99%   BMI 28.49 kg/m   Physical Exam  ED Course/Procedures     Procedures  MDM  Received patient in signout.  Mental status change.  Had left the hospital earlier this morning and went back home and found unresponsive again.  Dr. Hyacinth Meeker saw patient and gave flumazenil with increase in mental status.  Now mental status improving again.  Urine drug screen however does not have benzos.  With recurrent episodes will discuss with hospitalist again.       Benjiman Core, MD 09/22/19 [phone]

## 2019-09-22 NOTE — ED Notes (Signed)
Dr E in to assess 

## 2019-09-22 NOTE — ED Notes (Signed)
Call to floor to give report   RN will call back

## 2019-09-22 NOTE — ED Triage Notes (Signed)
Pt was seen in ED last night and sent home.  Brother got her up until 1230, pt ate lunch, then sat in recliner and brother found her in recliner and pt was unresponsive. Pt right pupil pinpoint and given narcan 2 mg. BP at scene 139/86, 58. 89% on RA increase 100% on 2 l. CBG 136. EDP at bedside. Responds to pain. Pt will wake up to speak when tongue used to check gag reflex then goes back to sleep

## 2019-09-22 NOTE — ED Provider Notes (Signed)
[facility] EMERGENCY DEPARTMENT Provider Note   CSN: [id number] Arrival date & time: 09/22/19  1426     History Chief Complaint  Patient presents with  . Altered Mental Status    Jennifer [name] is a 79 y.o. female.  HPI   Patient is a 79 year old female, she has a known history of essential hypertension, she has talk at Su Bos cardiomyopathy, she also has a history of coronary disease and borderline diabetes.  She was actually seen approximately 1 month ago and admitted to the hospital after she had a possible syncopal episode.  She is known to have Alzheimer's dementia and thus a level 5 caveat applies.  The patient presents to the hospital today with altered mental status.  According to the patient's brother who informed the ambulance personnel she was seen at the hospital last night for altered mental status.  She in the course of the work-up had no obvious etiology that was found, her Keppra dose was increased to 1000 mg twice a day, evidently the patient has not picked up that medication.  Per the paramedics she was awakened this morning, had something to eat and her brother found her unresponsive in the recliner.  The paramedics were called to the scene to find her in this obtunded state, she is not waking up to voice or painful stimuli, her vital signs are otherwise normal including her respiratory rate and oxygen status.  There is no other available information at this time.  Past Medical History:  Diagnosis Date  . Anxiety   . Borderline diabetes   . Coronary atherosclerosis of native coronary artery    Nonobstructive at catheterization June 2014  . DDD (degenerative disc disease), lumbar   . Depression   . Essential hypertension   . Hyperlipidemia   . Nephrolithiasis   . Psoriatic arthritis (HCC)   . Takotsubo cardiomyopathy    LVEF 60-65% September 2014    Patient Active Problem List   Diagnosis Date Noted  . Seizure (HCC)   . Alzheimer's dementia with behavioral  disturbance (HCC)   . Syncope and collapse 08/23/2019  . Rectal bleeding 03/14/2019  . Altered awareness, transient 01/06/2015  . Memory loss 01/29/2014  . Coronary atherosclerosis of native coronary artery 03/15/2013  . HLD (hyperlipidemia) 01/11/2013  . Essential hypertension 01/11/2013  . Takotsubo syndrome 12/19/2012  . Nephrolithiasis 12/16/2012  . Type II or unspecified type diabetes mellitus without mention of complication, not stated as uncontrolled 12/16/2012  . Other and unspecified hyperlipidemia 12/16/2012  . Psoriatic arthritis (HCC) 12/16/2012    Past Surgical History:  Procedure Laterality Date  . APPENDECTOMY    . BACK SURGERY  2014  . CHOLECYSTECTOMY    . LEFT HEART CATHETERIZATION WITH CORONARY ANGIOGRAM N/A 12/18/2012   Procedure: LEFT HEART CATHETERIZATION WITH CORONARY ANGIOGRAM;  Surgeon: Tonny Bollman, MD;  Location: [facility] CATH LAB;  Service: Cardiovascular;  Laterality: N/A;     OB History   No obstetric history on file.     Family History  Problem Relation Age of Onset  . CAD Father   . CAD Mother   . Hypertension Mother   . Breast cancer Mother   . Hypertension Brother         X 2  . Colon polyps Brother        thinks he is on 10 year surveillance program   . Cancer Maternal Grandmother        breast cancer   . Colon cancer Neg Hx  Social History   Tobacco Use  . Smoking status: Never Smoker  . Smokeless tobacco: Never Used  Substance Use Topics  . Alcohol use: No    Alcohol/week: 0.0 standard drinks  . Drug use: No    Home Medications Prior to Admission medications   Medication Sig Start Date End Date Taking? Authorizing Provider  aspirin 81 MG EC tablet Take 1 tablet (81 mg total) by mouth daily. Swallow whole. 12/19/12   Debbe Odea, MD  atorvastatin (LIPITOR) 10 MG tablet TAKE 1 TABLET AT 6:00 P.M. Patient taking differently: Take 10 mg by mouth every evening.  04/14/15   Satira Sark, MD  Biotin 5000 MCG CAPS Take 5,000  mcg by mouth every evening.     [provider]  busPIRone (BUSPAR) 10 MG tablet Take 10 mg by mouth 2 (two) times daily.  03/02/19   [provider]  calcium carbonate (OSCAL) 1500 (600 Ca) MG TABS tablet Take 600 mg of elemental calcium by mouth 2 (two) times daily with a meal.    [provider]  clobetasol cream (TEMOVATE) 3.71 % Apply 1 application topically 2 (two) times daily as needed (for eczema).     [provider]  clonazePAM (KLONOPIN) 0.5 MG tablet Take 0.25-0.5 mg by mouth daily as needed for anxiety.  11/30/18   [provider]  fish oil-omega-3 fatty acids 1000 MG capsule Take 1 g by mouth every evening.     [provider]  hydroxypropyl methylcellulose / hypromellose (ISOPTO TEARS / GONIOVISC) 2.5 % ophthalmic solution Place 1 drop into the left eye daily as needed for dry eyes.     [provider]  Ixekizumab (TALTZ) 80 MG/ML SOSY Inject 1 Syringe into the skin every 28 (twenty-eight) days.  05/16/18   [provider]  levETIRAcetam (KEPPRA) 1000 MG tablet Take 1 tablet (1,000 mg total) by mouth 2 (two) times daily. 09/22/19   Rolland Porter, MD  memantine (NAMENDA XR) 28 MG CP24 24 hr capsule Take 1 capsule (28 mg total) by mouth daily. Patient taking differently: Take 28 mg by mouth every evening.  04/12/17   Tomi Likens, Adam R, DO  metoprolol succinate (TOPROL-XL) 25 MG 24 hr tablet TAKE (1/2) TABLET BY MOUTH DAILY. Patient taking differently: Take 12.5 mg by mouth every evening.  07/30/15   Satira Sark, MD  Multiple Vitamins-Minerals (CENTRUM SILVER PO) Take 1 tablet by mouth every evening.     [provider]  NONFORMULARY OR COMPOUNDED ITEM Apply 1 application topically 2 (two) times daily as needed (for inverse psoriasis). Triamcinolone Cream 1%: Silver Sulfadiazine    [provider]  PARoxetine (PAXIL) 40 MG tablet Take 40 mg by mouth every evening.     [provider]     Allergies    Cyclobenzaprine  Review of Systems   Review of Systems  Unable to perform ROS: Mental status change    Physical Exam Updated Vital Signs Wt 66.2 kg   BMI 28.49 kg/m   Physical Exam Vitals and nursing note reviewed.  Constitutional:      General: She is in acute distress.     Appearance: She is well-developed. She is ill-appearing and toxic-appearing.     Comments: obtunded  HENT:     Head: Normocephalic and atraumatic.     Mouth/Throat:     Pharynx: No oropharyngeal exudate.  Eyes:     General: No scleral icterus.       Right eye: No discharge.  Left eye: No discharge.     Conjunctiva/sclera: Conjunctivae normal.     Comments: R eye small, L eye large pupil  Neck:     Thyroid: No thyromegaly.     Vascular: No JVD.  Cardiovascular:     Rate and Rhythm: Normal rate and regular rhythm.     Heart sounds: Normal heart sounds. No murmur. No friction rub. No gallop.   Pulmonary:     Effort: Pulmonary effort is normal. No respiratory distress.     Breath sounds: Normal breath sounds. No wheezing or rales.  Abdominal:     General: Bowel sounds are normal. There is no distension.     Palpations: Abdomen is soft. There is no mass.     Tenderness: There is no abdominal tenderness.  Musculoskeletal:        General: No tenderness. Normal range of motion.     Cervical back: Normal range of motion and neck supple.  Lymphadenopathy:     Cervical: No cervical adenopathy.  Skin:    General: Skin is warm and dry.     Findings: No erythema or rash.  Neurological:     Comments: This patient is obtunded, she does not respond to voice, to deep painful stimuli she will wake up long enough to say a couple words slurring her speech and then falls back asleep immediately.  When asked to move all 4 of her extremities she can barely move all 4 of them, she cannot lift the feet off the bed, she will not raise her arms but she will grip bilaterally.  She has anisocoria,  she was given Narcan prehospital without improvement  Psychiatric:        Behavior: Behavior normal.     ED Results / Procedures / Treatments   Labs (all labs ordered are listed, but only abnormal results are displayed) Labs Reviewed - No data to display  EKG None  Radiology CT Head Wo Contrast  Result Date: 09/22/2019 CLINICAL DATA:  Altered mental status EXAM: CT HEAD WITHOUT CONTRAST TECHNIQUE: Contiguous axial images were obtained from the base of the skull through the vertex without intravenous contrast. COMPARISON:  08/22/2019 FINDINGS: Brain: No evidence of acute infarction, hemorrhage, hydrocephalus, extra-axial collection or mass lesion/mass effect. Mild atrophic changes and chronic white matter ischemic changes are seen. Vascular: No hyperdense vessel or unexpected calcification. Skull: Normal. Negative for fracture or focal lesion. Sinuses/Orbits: No acute finding. Other: None. IMPRESSION: No acute intracranial abnormality noted. Electronically Signed   By: Alcide Clever M.D.   On: 09/22/2019 00:25   DG Chest Port 1 View  Result Date: 09/21/2019 CLINICAL DATA:  Cough EXAM: PORTABLE CHEST 1 VIEW COMPARISON:  August 22, 2019 FINDINGS: The heart size and mediastinal contours are within normal limits. Both lungs are clear. The visualized skeletal structures are unremarkable. Aortic calcifications are noted. IMPRESSION: No active disease. Electronically Signed   By: Katherine Mantle M.D.   On: 09/21/2019 23:42    Procedures Procedures (including critical care time)  Medications Ordered in ED Medications  flumazenil (ROMAZICON) injection 0.2 mg (has no administration in time range)    ED Course  I have reviewed the triage vital signs and the nursing notes.  Pertinent labs & imaging results that were available during my care of the patient were reviewed by me and considered in my medical decision making (see chart for details).    MDM Rules/Calculators/A&P  This patient complains of altered mental status, this involves an extensive number of treatment options, and is a complaint that carries with it a high risk of complications and morbidity.  The differential diagnosis includes metabolic encephalopathy, drug overdose, intracranial hemorrhage, infection, seizure.  Review of the medical record showed a CT scan of the brain that was obtained approximately 13 to 14 hours ago which was negative, in the setting of similar symptoms   I Ordered, reviewed, and interpreted labs, which included metabolic panels, CBC, drug screens, alcohol  I ordered medication flumazenil for altered mental status and reversible of benzodiazepine intoxication  Additional history obtained from EMS Previous records obtained and reviewed patient was here yesterday, neurology recommended increasing the Keppra, she has already had an MRI and EEG due to abnormal findings, she was admitted at the beginning of March due to some type of metabolic encephalopathy  Change of shift, care signed out to Dr. Rubin Payor to follow-up results and disposition accordingly   Critical interventions: Flumazenil to reverse altered mental status from possible benzodiazepine overdose  After the interventions stated above, I reevaluated the patient and found improvement in her mental status within 2 or 3 minutes of getting flumazenil.  She was awake, answering questions and following commands, still sleepy but able to lift all 4 extremities off the bed for 5 seconds each.   Final Clinical Impression(s) / ED Diagnoses Final diagnoses:  None    Rx / DC Orders ED Discharge Orders    None       Eber Hong, MD 09/22/19 [phone]

## 2019-09-22 NOTE — ED Notes (Signed)
In and out   Spec to lab  Pt is alert  Laughing   Speaking regarding to caring for her brother

## 2019-09-22 NOTE — H&P (Signed)
History and Physical    DANNIA SNOOK HBZ:[id number] DOB: 12-Oct-1940 DOA: 09/22/2019  PCP: Monico Blitz, MD   Patient coming from: Home  I have personally briefly reviewed patient's old medical records in [place]  Chief Complaint: Altered mental status.  HPI: Jennifer [name] is a 79 y.o. female with medical history significant for diabetes mellitus, psoriatic arthritis, Takotsubo cardiomyopathy, Alzheimer's dementia, hypertension, seizures. Patient was brought to the ED via EMS reports of unresponsiveness.  Patient lives with her brother who is her primary caregiver. History is obtained from patient's brother who is present at bedside, due to patient's baseline dementia she is unable to to give me a history. Patient's brother reports that this morning patient woke up and was at her baseline mental status.  She was sitting in a chair, and then suddenly she was not responding to him, eyes were closed like she had passed out.  No jerking activity of her extremities was noted. No difficulty breathing no cough.  No vomiting, no loose stools.  On questioning patient's brother, he is very sure patient has not taking clonazepam in several months, he has not given patient and there is no chance that patient could have found medication to take it.  Reports patient got 0.5 mg of clonazepam several months ago and slept for more than a whole day, subsequent dosing of 0.25 mg also caused sedation but not to the same extent.  Since then he has not given patient medication.  Denies use of any other medications Xanax or Ativan.  He takes ibuprofen and Tylenol PRN for pain.   Patient was in the ED yesterday with similar presentation, work-up-included head CT negative for acute intracranial abnormality,, CBC, CMP, troponin, all unremarkable.  Keppra level ordered and pending.  ED provider talked to hospitalist Dr. Leonel Ramsay, who recommended increasing patient's dose of Keppra from 750 to 1000 mg,  patient has not had a chance to take this increased dose before presenting to the ED this again.  It was decided that patient should be discharged from the ED.  Recent hospitalization-3/4-3/6 also patient presented with reports of passing out, even requiring CPR prior to arrival, managed for syncope/acute metabolic encephalopathy with underlying dementia.  Treated for UTI.  EEG was without epileptiform waves, so dose of Keppra was continued. Patient followed up with her neurologist and dose of Keppra was increased to 750 mg.  ED Course: Heart rate 50s.  Temperature 97.6.  Blood pressure systolic 101B to 510.  O2 sats > 93% on room air.  Unremarkable CBC, BMP,  Troponin,.  UDS negative for benzos or opiates.   Patient was obtunded on arrival to the ED. As clonazepam was noted on patient's medication list, Flumazenil 0.2 mg was given and patient suddenly became awake, within 2 to 3 minutes of giving flumazenil, and was asking questions and following commands, though still sleepy was able to lift all 4 extremities of the bed for 5 seconds each. Patient's brother did not follow patient to the ED via EMS, but came to the ED later and was surprised to see his sister awake.   With 2 episodes of altered mental status/syncope within 8 hours, hospitalist was called to admit patient.  Review of Systems: As per HPI all other systems reviewed and negative.  Past Medical History:  Diagnosis Date  . Alzheimer's dementia [facility])    per brother by Dr Loretta Plume in [place]  . Anxiety   . Borderline diabetes   . Coronary atherosclerosis of  native coronary artery    Nonobstructive at catheterization June 2014  . DDD (degenerative disc disease), lumbar   . Depression   . Essential hypertension   . Hyperlipidemia   . Nephrolithiasis   . Psoriatic arthritis (HCC)   . Takotsubo cardiomyopathy    LVEF 60-65% September 2014    Past Surgical History:  Procedure Laterality Date  . APPENDECTOMY    . BACK SURGERY   2014  . CHOLECYSTECTOMY    . LEFT HEART CATHETERIZATION WITH CORONARY ANGIOGRAM N/A 12/18/2012   Procedure: LEFT HEART CATHETERIZATION WITH CORONARY ANGIOGRAM;  Surgeon: Tonny Bollman, MD;  Location: [facility] CATH LAB;  Service: Cardiovascular;  Laterality: N/A;     reports that she has never smoked. She has never used smokeless tobacco. She reports that she does not drink alcohol or use drugs.  Allergies  Allergen Reactions  . Cyclobenzaprine     TOO STRONG  CAN'T USE AND WORK    Family History  Problem Relation Age of Onset  . CAD Father   . CAD Mother   . Hypertension Mother   . Breast cancer Mother   . Hypertension Brother         X 2  . Colon polyps Brother        thinks he is on 10 year surveillance program   . Cancer Maternal Grandmother        breast cancer   . Colon cancer Neg Hx     Prior to Admission medications   Medication Sig Start Date End Date Taking? Authorizing Provider  aspirin 81 MG EC tablet Take 1 tablet (81 mg total) by mouth daily. Swallow whole. 12/19/12   Calvert Cantor, MD  atorvastatin (LIPITOR) 10 MG tablet TAKE 1 TABLET AT 6:00 P.M. Patient taking differently: Take 10 mg by mouth every evening.  04/14/15   Jonelle Sidle, MD  Biotin 5000 MCG CAPS Take 5,000 mcg by mouth every evening.     [provider]  busPIRone (BUSPAR) 10 MG tablet Take 10 mg by mouth 2 (two) times daily.  03/02/19   [provider]  calcium carbonate (OSCAL) 1500 (600 Ca) MG TABS tablet Take 600 mg of elemental calcium by mouth 2 (two) times daily with a meal.    [provider]  clobetasol cream (TEMOVATE) 0.05 % Apply 1 application topically 2 (two) times daily as needed (for eczema).     [provider]  clonazePAM (KLONOPIN) 0.5 MG tablet Take 0.25-0.5 mg by mouth daily as needed for anxiety.  11/30/18   [provider]  fish oil-omega-3 fatty acids 1000 MG capsule Take 1 g by mouth every evening.     [provider]    hydroxypropyl methylcellulose / hypromellose (ISOPTO TEARS / GONIOVISC) 2.5 % ophthalmic solution Place 1 drop into the left eye daily as needed for dry eyes.     [provider]  Ixekizumab (TALTZ) 80 MG/ML SOSY Inject 1 Syringe into the skin every 28 (twenty-eight) days.  05/16/18   [provider]  levETIRAcetam (KEPPRA) 1000 MG tablet Take 1 tablet (1,000 mg total) by mouth 2 (two) times daily. 09/22/19   Devoria Albe, MD  memantine (NAMENDA XR) 28 MG CP24 24 hr capsule Take 1 capsule (28 mg total) by mouth daily. Patient taking differently: Take 28 mg by mouth every evening.  04/12/17   Everlena Cooper, Adam R, DO  metoprolol succinate (TOPROL-XL) 25 MG 24 hr tablet TAKE (1/2) TABLET BY MOUTH DAILY. Patient taking differently:  Take 12.5 mg by mouth every evening.  07/30/15   Jonelle Sidle, MD  Multiple Vitamins-Minerals (CENTRUM SILVER PO) Take 1 tablet by mouth every evening.     [provider]  NONFORMULARY OR COMPOUNDED ITEM Apply 1 application topically 2 (two) times daily as needed (for inverse psoriasis). Triamcinolone Cream 1%: Silver Sulfadiazine    [provider]  PARoxetine (PAXIL) 40 MG tablet Take 40 mg by mouth every evening.     [provider]    Physical Exam: Vitals:   09/22/19 1758 09/22/19 1800 09/22/19 1830 09/22/19 1915  BP:  (!) 162/79 (!) 126/94   Pulse: (!) 56 (!) 56 61 (!) 59  Resp: (!) 22 17 14 14   Temp:      TempSrc:      SpO2: 100% 100% 99% 96%  Weight:        Constitutional: NAD, calm, comfortable Vitals:   09/22/19 1758 09/22/19 1800 09/22/19 1830 09/22/19 1915  BP:  (!) 162/79 (!) 126/94   Pulse: (!) 56 (!) 56 61 (!) 59  Resp: (!) 22 17 14 14   Temp:      TempSrc:      SpO2: 100% 100% 99% 96%  Weight:       Eyes: PERRL, lids and conjunctivae normal ENMT: Mucous membranes are moist.  Neck: normal, supple, no masses, no thyromegaly Respiratory: clear to auscultation bilaterally, no wheezing, no crackles.  Normal respiratory effort. No accessory muscle use.  Cardiovascular: Regular rate and rhythm, no murmurs / rubs / gallops. No extremity edema. 2+ pedal pulses.   Abdomen: no tenderness, no masses palpated. No hepatosplenomegaly. Bowel sounds positive.  Musculoskeletal: no clubbing / cyanosis. No joint deformity upper and lower extremities. Good ROM, no contractures. Normal muscle tone.  Skin: no rashes, lesions, ulcers. No induration Neurologic: No apparent cranial nerve abnormality, 5/5 strength in all extremities. Psychiatric: Awake and alert, oriented to person, but not oriented to place or situation or time.  Labs on Admission: I have personally reviewed following labs and imaging studies  CBC: Recent Labs  Lab 09/21/19 2309 09/22/19 1513  WBC 6.5 6.3  NEUTROABS 1.8  --   HGB 14.0 14.0  HCT 43.0 42.3  MCV 101.9* 100.7*  PLT 168 160   Basic Metabolic Panel: Recent Labs  Lab 09/21/19 2309 09/22/19 1513  NA 138 140  K 3.5 4.2  CL 101 103  CO2 30 32  GLUCOSE 125* 111*  BUN 18 15  CREATININE 0.69 0.59  CALCIUM 8.8* 8.9   Liver Function Tests: Recent Labs  Lab 09/21/19 2309 09/22/19 1513  AST 19 21  ALT 16 16  ALKPHOS 49 44  BILITOT 0.4 0.7  PROT 6.4* 6.1*  ALBUMIN 3.4* 3.3*   Urine analysis:    Component Value Date/Time   COLORURINE YELLOW 09/22/2019 1535   APPEARANCEUR CLEAR 09/22/2019 1535   LABSPEC 1.012 09/22/2019 1535   PHURINE 7.0 09/22/2019 1535   GLUCOSEU NEGATIVE 09/22/2019 1535   HGBUR NEGATIVE 09/22/2019 1535   BILIRUBINUR NEGATIVE 09/22/2019 1535   KETONESUR NEGATIVE 09/22/2019 1535   PROTEINUR NEGATIVE 09/22/2019 1535   NITRITE NEGATIVE 09/22/2019 1535   LEUKOCYTESUR NEGATIVE 09/22/2019 1535    Radiological Exams on Admission: CT Head Wo Contrast  Result Date: 09/22/2019 CLINICAL DATA:  Altered mental status EXAM: CT HEAD WITHOUT CONTRAST TECHNIQUE: Contiguous axial images were obtained from the base of the skull through the vertex without  intravenous contrast. COMPARISON:  08/22/2019 FINDINGS: Brain: No evidence of acute infarction,  hemorrhage, hydrocephalus, extra-axial collection or mass lesion/mass effect. Mild atrophic changes and chronic white matter ischemic changes are seen. Vascular: No hyperdense vessel or unexpected calcification. Skull: Normal. Negative for fracture or focal lesion. Sinuses/Orbits: No acute finding. Other: None. IMPRESSION: No acute intracranial abnormality noted. Electronically Signed   By: Alcide Clever M.D.   On: 09/22/2019 00:25   DG Chest Port 1 View  Result Date: 09/21/2019 CLINICAL DATA:  Cough EXAM: PORTABLE CHEST 1 VIEW COMPARISON:  August 22, 2019 FINDINGS: The heart size and mediastinal contours are within normal limits. Both lungs are clear. The visualized skeletal structures are unremarkable. Aortic calcifications are noted. IMPRESSION: No active disease. Electronically Signed   By: Katherine Mantle M.D.   On: 09/21/2019 23:42    EKG: Independently reviewed.  Sinus rhythm, LAFB.  QTc 455.  Assessment/Plan Active Problems:   AMS (altered mental status)  Altered mental status/syncope- neurologic versus cardiac.  Recent extensive work-up, head CT and subsequent MRI brain, EEG, echocardiogram.  Etiology notes identified.  Work-up today also nonrevealing.  Patient's brother reports recent cardiac monitoring for about 14 days, awaiting results, but patient did not have any syncopal/altered mental status episodes during that time.  Interestingly patient's mental status rapidly and significantly improved after IV flumazenil.  But her UDS is negative, and her brother is absolutely certain that patient has not had any opiates in the recent past. -Follow-up Keppra level -Resume home Keppra at now increased dose 1000 mg every 12 hourly, as recommended by neurologist last night. -Monitor on telemetry  Hypertension-blood pressure elevated. -Resume home metoprolol -Losartan HCTZ discontinued on recent  hospitalization as blood pressure was stable off of medication. - PRN hydralazine for now.  Dementia-at baseline, needs assistance with most  ADLs, sometimes recognizes her brother.  Daughter cites aggressiveness and agitation at home. -Resume home buspirone. - PRN IM haloperidol 5mg  for agitation   DVT prophylaxis: Lovenox Code Status: Full code Family Communication: Brother at bedside Disposition Plan: 2 days Consults called: None Admission status: Obs, tele   MD [facility]  09/22/2019, 8:36 PM

## 2019-09-23 DIAGNOSIS — G4710 Hypersomnia, unspecified: Secondary | ICD-10-CM | POA: Diagnosis not present

## 2019-09-23 DIAGNOSIS — I10 Essential (primary) hypertension: Secondary | ICD-10-CM | POA: Diagnosis not present

## 2019-09-23 DIAGNOSIS — F03918 Unspecified dementia, unspecified severity, with other behavioral disturbance: Secondary | ICD-10-CM

## 2019-09-23 DIAGNOSIS — R4182 Altered mental status, unspecified: Secondary | ICD-10-CM

## 2019-09-23 DIAGNOSIS — R569 Unspecified convulsions: Secondary | ICD-10-CM

## 2019-09-23 DIAGNOSIS — F0391 Unspecified dementia with behavioral disturbance: Secondary | ICD-10-CM

## 2019-09-23 LAB — GLUCOSE, CAPILLARY
Glucose-Capillary: 86 mg/dL (ref 70–99)
Glucose-Capillary: 145 mg/dL — ABNORMAL HIGH (ref 70–99)

## 2019-09-23 LAB — SARS CORONAVIRUS 2 (TAT 6-24 HRS): SARS Coronavirus 2: NEGATIVE

## 2019-09-23 MED ORDER — MODAFINIL 200 MG PO TABS
100.0000 mg | ORAL_TABLET | Freq: Every day | ORAL | Status: DC
  Administered 2019-09-23: 11:00:00 100 mg via ORAL
  Filled 2019-09-23: qty 1

## 2019-09-23 MED ORDER — METOPROLOL SUCCINATE ER 25 MG PO TB24: 25.0000 mg | ORAL_TABLET | Freq: Every day | ORAL | 1 refills | Status: DC

## 2019-09-23 MED ORDER — MODAFINIL 100 MG PO TABS: 100.0000 mg | ORAL_TABLET | Freq: Every day | ORAL | 1 refills | Status: DC

## 2019-09-23 NOTE — Discharge Summary (Signed)
Physician Discharge Summary  Jennifer [name] NWG:[id number] DOB: 02/24/1941 DOA: 09/22/2019  PCP: Kirstie Peri, MD  Admit date: 09/22/2019 Discharge date: 09/23/2019  Time spent: 35 minutes  Recommendations for Outpatient Follow-up:  1. Repeat basic metabolic panel to evaluate lites renal function 2. Reassess blood pressure and adjust antihypertensive regimen as required 3. Reassess patient response to modafinil and further adjust dose as needed for better control of hypersomnia condition.   Discharge Diagnoses:  Active Problems:   AMS (altered mental status)   Hypersomnia   Dementia with behavioral disturbance (HCC) Hypertension Hyperlipidemia History of seizure disorder   Discharge Condition: Stable and improved.  Discharged home with instruction to follow-up with PCP and neurology service as an outpatient.  CODE STATUS: Full code.  Diet recommendation: Heart healthy diet.  Filed Weights   09/22/19 1433 09/22/19 1956  Weight: 66.2 kg 58.7 kg    History of present illness:  As per H&P written by Dr. Mariea Clonts on 09/22/2019 79 y.o. female with medical history significant for diabetes mellitus, psoriatic arthritis, Takotsubo cardiomyopathy, Alzheimer's dementia, hypertension, seizures. Patient was brought to the ED via EMS reports of unresponsiveness.  Patient lives with her brother who is her primary caregiver. History is obtained from patient's brother who is present at bedside, due to patient's baseline dementia she is unable to to give me a history. Patient's brother reports that this morning patient woke up and was at her baseline mental status.  She was sitting in a chair, and then suddenly she was not responding to him, eyes were closed like she had passed out.  No jerking activity of her extremities was noted. No difficulty breathing no cough.  No vomiting, no loose stools.  On questioning patient's brother, he is very sure patient has not taking clonazepam in several  months, he has not given patient and there is no chance that patient could have found medication to take it.  Reports patient got 0.5 mg of clonazepam several months ago and slept for more than a whole day, subsequent dosing of 0.25 mg also caused sedation but not to the same extent.  Since then he has not given patient medication.  Denies use of any other medications Xanax or Ativan.  She takes ibuprofen and Tylenol PRN for pain.   Hospital Course:  1-hypersomnia/syncope/metabolic encephalopathy on top of underlying dementia -No signs of acute infection appreciated -Extensive work-up recently done demonstrating no acute ischemic or hemorrhagic stroke and also electroencephalogram at that time without signs of acute epileptic waveforms appreciated. -Patient medication has been recently adjusted; per discussion with family members, biggest concern is extensive hypersomnia with episodes sometimes of sleeping until the afternoon, with then subsequent staying awake throughout the night and having to the sleep disorders. -Patient will be started on Provigil 100 mg daily and family members instructed for sleep hygiene and to schedule for awake/asleep times. -Continue treatment for seizure disorders as per neurology recommendations. -Outpatient follow-up with PCP in 10 days.  2-essential hypertension -Continue metoprolol on daily basis -Heart healthy diet has been encouraged.  3-history of seizure disorder -As per recent neurology recommendation continue Keppra 1000 mg twice a day.  4-Alzheimer's dementia with behavioral disturbances -Continue constant reorientation -As mentioned above sleep hygiene and a schedule cycles -Continue Namenda, BuSpar and Paxil. -Continue patient follow-up with neurology service.  5-hyperlipidemia -Continue statins.   Procedures:  See below for x-ray report.  Consultations:  None  Discharge Exam: Vitals:   09/23/19 0751 09/23/19 1048  BP: (!) 134/58 Marland Kitchen)  159/62  Pulse: (!) 56 (!) 59  Resp: 18   Temp: 98.3 F (36.8 C)   SpO2: 98%     General: Alert, awake and oriented x1; able to follow commands and appears to be in no acute distress.  Per brother at bedside patient at baseline mentation wise.  No fever, no nausea, no vomiting, no pain. Cardiovascular: RRR, no rubs, no gallops, no JVD on exam. Respiratory: Good air movement bilaterally; no using accessory muscles.  Normal respiratory effort.  There is no wheezing or crackles on exam. Abdomen: Soft, nontender, nondistended, positive bowel sounds Extremities: No cyanosis or clubbing.  Discharge Instructions   Discharge Instructions    Diet - low sodium heart healthy   Complete by: As directed    Discharge instructions   Complete by: As directed    Take medications as prescribed Follow heart healthy diet Maintain adequate hydration Follow-up with PCP in 10 days.     Allergies as of 09/23/2019      Reactions   Cyclobenzaprine    TOO STRONG  CAN'T USE AND WORK      Medication List    TAKE these medications   aspirin 81 MG EC tablet Take 1 tablet (81 mg total) by mouth daily. Swallow whole.   atorvastatin 10 MG tablet Commonly known as: LIPITOR TAKE 1 TABLET AT 6:00 P.M. What changed: See the new instructions.   Biotin 5000 MCG Caps Take 5,000 mcg by mouth every evening.   busPIRone 10 MG tablet Commonly known as: BUSPAR Take 10 mg by mouth 2 (two) times daily.   calcium carbonate 1500 (600 Ca) MG Tabs tablet Commonly known as: OSCAL Take 600 mg of elemental calcium by mouth 2 (two) times daily with a meal.   clobetasol cream 0.05 % Commonly known as: TEMOVATE Apply 1 application topically 2 (two) times daily as needed (for eczema).   fish oil-omega-3 fatty acids 1000 MG capsule Take 1 g by mouth every evening.   hydroxypropyl methylcellulose / hypromellose 2.5 % ophthalmic solution Commonly known as: ISOPTO TEARS / GONIOVISC Place 1 drop into the left eye  daily as needed for dry eyes.   levETIRAcetam 1000 MG tablet Commonly known as: KEPPRA Take 1 tablet (1,000 mg total) by mouth 2 (two) times daily.   memantine 28 MG Cp24 24 hr capsule Commonly known as: NAMENDA XR Take 1 capsule (28 mg total) by mouth daily. What changed: when to take this   metoprolol succinate 25 MG 24 hr tablet Commonly known as: TOPROL-XL Take 1 tablet (25 mg total) by mouth daily. What changed: See the new instructions.   modafinil 100 MG tablet Commonly known as: PROVIGIL Take 1 tablet (100 mg total) by mouth daily. Start taking on: September 24, 2019   PARoxetine 40 MG tablet Commonly known as: PAXIL Take 40 mg by mouth every evening.   Taltz 80 MG/ML Sosy Generic drug: Ixekizumab Inject 1 Syringe into the skin every 28 (twenty-eight) days.      Allergies  Allergen Reactions  . Cyclobenzaprine     TOO STRONG  CAN'T USE AND WORK   Follow-up Information    Kirstie Peri, MD. Schedule an appointment as soon as possible for a visit in 10 day(s).   Specialty: Internal Medicine Contact information: [address] [place] [address] [phone]        Jonelle Sidle, MD .   Specialty: Cardiology Contact information: [address] [place] [address] [phone]  The results of significant diagnostics from this hospitalization (including imaging, microbiology, ancillary and laboratory) are listed below for reference.    Significant Diagnostic Studies: CT Head Wo Contrast  Result Date: 09/22/2019 CLINICAL DATA:  Altered mental status EXAM: CT HEAD WITHOUT CONTRAST TECHNIQUE: Contiguous axial images were obtained from the base of the skull through the vertex without intravenous contrast. COMPARISON:  08/22/2019 FINDINGS: Brain: No evidence of acute infarction, hemorrhage, hydrocephalus, extra-axial collection or mass lesion/mass effect. Mild atrophic changes and chronic white matter ischemic changes are seen. Vascular: No  hyperdense vessel or unexpected calcification. Skull: Normal. Negative for fracture or focal lesion. Sinuses/Orbits: No acute finding. Other: None. IMPRESSION: No acute intracranial abnormality noted. Electronically Signed   By: Inez Catalina M.D.   On: 09/22/2019 00:25   DG Chest Port 1 View  Result Date: 09/21/2019 CLINICAL DATA:  Cough EXAM: PORTABLE CHEST 1 VIEW COMPARISON:  August 22, 2019 FINDINGS: The heart size and mediastinal contours are within normal limits. Both lungs are clear. The visualized skeletal structures are unremarkable. Aortic calcifications are noted. IMPRESSION: No active disease. Electronically Signed   By: Constance Holster M.D.   On: 09/21/2019 23:42   EEG adult  Result Date: 08/24/2019 Lora Havens, MD     08/24/2019  4:38 PM Patient Name: VIKTORYA ARGUIJO MRN: [id number] Epilepsy Attending: Lora Havens Referring Physician/Provider: Dr Eleonore Chiquito Date: 08/24/2019 Duration: 26.16 mins Patient history: 79yo F with transient alteration of awareness. EEG to evaluate for seizure. Level of alertness: awake, asleep AEDs during EEG study: Keppra, Clonazepam Technical aspects: This EEG study was done with scalp electrodes positioned according to the 10-20 International system of electrode placement. Electrical activity was acquired at a sampling rate of 500Hz  and reviewed with a high frequency filter of 70Hz  and a low frequency filter of 1Hz . EEG data were recorded continuously and digitally stored. DESCRIPTION: No clear posterior dominant rhythm was seen. EEG showed continuous generalized 5-7hz  theta slowing. Sleep was characterized by sleep spindles (12-14hz ), maximal frontocentral.  Hyperventilation and photic stimulation were not performed. ABNORMALITY - Continuous slow, generalized IMPRESSION: This study is suggestive of mild to moderate diffuse encephalopathy, non specific to etiology. No seizures or epileptiform discharges were seen throughout the recording. Lora Havens     Microbiology: Recent Results (from the past 240 hour(s))  SARS CORONAVIRUS 2 (TAT 6-24 HRS) Nasopharyngeal Nasopharyngeal Swab     Status: None   Collection Time: 09/22/19  5:07 PM   Specimen: Nasopharyngeal Swab  Result Value Ref Range Status   SARS Coronavirus 2 NEGATIVE NEGATIVE Final    Comment: (NOTE) SARS-CoV-2 target nucleic acids are NOT DETECTED. The SARS-CoV-2 RNA is generally detectable in upper and lower respiratory specimens during the acute phase of infection. Negative results do not preclude SARS-CoV-2 infection, do not rule out co-infections with other pathogens, and should not be used as the sole basis for treatment or other patient management decisions. Negative results must be combined with clinical observations, patient history, and epidemiological information. The expected result is Negative. Fact Sheet for Patients: SugarRoll.be Fact Sheet for Healthcare Providers: https://www.woods-mathews.com/ This test is not yet approved or cleared by the [place] FDA and  has been authorized for detection and/or diagnosis of SARS-CoV-2 by FDA under an Emergency Use Authorization (EUA). This EUA will remain  in effect (meaning this test can be used) for the duration of the COVID-19 declaration under Section 56 4(b)(1) of the Act, 21 U.S.C. section 360bbb-3(b)(1), unless the authorization is terminated  or revoked sooner. Performed at [facility], [address] [place], [place] [address]      Labs: Basic Metabolic Panel: Recent Labs  Lab 09/21/19 2309 09/22/19 1513  NA 138 140  K 3.5 4.2  CL 101 103  CO2 30 32  GLUCOSE 125* 111*  BUN 18 15  CREATININE 0.69 0.59  CALCIUM 8.8* 8.9   Liver Function Tests: Recent Labs  Lab 09/21/19 2309 09/22/19 1513  AST 19 21  ALT 16 16  ALKPHOS 49 44  BILITOT 0.4 0.7  PROT 6.4* 6.1*  ALBUMIN 3.4* 3.3*   CBC: Recent Labs  Lab 09/21/19 2309 09/22/19 1513   WBC 6.5 6.3  NEUTROABS 1.8  --   HGB 14.0 14.0  HCT 43.0 42.3  MCV 101.9* 100.7*  PLT 168 160   CBG: Recent Labs  Lab 09/23/19 0739 09/23/19 1140  GLUCAP 86 145*    Signed:  Vassie Loll MD.  [facility] 09/23/2019, 1:27 PM

## 2019-09-24 DIAGNOSIS — F0281 Dementia in other diseases classified elsewhere with behavioral disturbance: Secondary | ICD-10-CM | POA: Diagnosis not present

## 2019-09-24 DIAGNOSIS — G9341 Metabolic encephalopathy: Secondary | ICD-10-CM | POA: Diagnosis not present

## 2019-09-24 DIAGNOSIS — R5381 Other malaise: Secondary | ICD-10-CM | POA: Diagnosis not present

## 2019-09-24 DIAGNOSIS — R55 Syncope and collapse: Secondary | ICD-10-CM | POA: Diagnosis not present

## 2019-09-24 DIAGNOSIS — G309 Alzheimer's disease, unspecified: Secondary | ICD-10-CM | POA: Diagnosis not present

## 2019-09-24 DIAGNOSIS — I10 Essential (primary) hypertension: Secondary | ICD-10-CM | POA: Diagnosis not present

## 2019-09-25 ENCOUNTER — Ambulatory Visit (INDEPENDENT_AMBULATORY_CARE_PROVIDER_SITE_OTHER): Payer: Medicare Other | Admitting: Cardiology

## 2019-09-25 ENCOUNTER — Encounter: Payer: Self-pay | Admitting: Cardiology

## 2019-09-25 ENCOUNTER — Telehealth: Payer: Self-pay | Admitting: Internal Medicine

## 2019-09-25 ENCOUNTER — Other Ambulatory Visit: Payer: Self-pay

## 2019-09-25 VITALS — BP 160/70 | HR 48 | Ht 60.0 in | Wt 129.4 lb

## 2019-09-25 DIAGNOSIS — F0281 Dementia in other diseases classified elsewhere with behavioral disturbance: Secondary | ICD-10-CM | POA: Diagnosis not present

## 2019-09-25 DIAGNOSIS — I5181 Takotsubo syndrome: Secondary | ICD-10-CM | POA: Diagnosis not present

## 2019-09-25 DIAGNOSIS — I2510 Atherosclerotic heart disease of native coronary artery without angina pectoris: Secondary | ICD-10-CM | POA: Diagnosis not present

## 2019-09-25 DIAGNOSIS — R4182 Altered mental status, unspecified: Secondary | ICD-10-CM | POA: Diagnosis not present

## 2019-09-25 DIAGNOSIS — Z8669 Personal history of other diseases of the nervous system and sense organs: Secondary | ICD-10-CM | POA: Diagnosis not present

## 2019-09-25 DIAGNOSIS — I10 Essential (primary) hypertension: Secondary | ICD-10-CM | POA: Diagnosis not present

## 2019-09-25 DIAGNOSIS — R55 Syncope and collapse: Secondary | ICD-10-CM | POA: Diagnosis not present

## 2019-09-25 DIAGNOSIS — G4730 Sleep apnea, unspecified: Secondary | ICD-10-CM | POA: Diagnosis not present

## 2019-09-25 DIAGNOSIS — G309 Alzheimer's disease, unspecified: Secondary | ICD-10-CM | POA: Diagnosis not present

## 2019-09-25 MED ORDER — AMLODIPINE BESYLATE 5 MG PO TABS: 5.0000 mg | ORAL_TABLET | Freq: Every day | ORAL | 6 refills | Status: DC

## 2019-09-25 NOTE — Assessment & Plan Note (Signed)
H/O Takotsubo event in June 2014-EF normalized by echo in Sept 2014 Most recent echo 08/23/2019 showed normal LVF-EF 60-65%, no LVH

## 2019-09-25 NOTE — Telephone Encounter (Signed)
Spoke with spouse and reports patient has had 2 incidents of being found unresponsive and was advised she should cancel TCS at this time.   Called endo-LMOVM to cancel  FYI to [facility]

## 2019-09-25 NOTE — Assessment & Plan Note (Signed)
Followed by Dr Adriana Mccallum- Keppra increased after her recent hospitalization earlier in March.

## 2019-09-25 NOTE — Assessment & Plan Note (Signed)
Pt has excessive somnolence of unclear etiology.  Unlikely this is cardiac but based on my review of her monitor (final report is pending) I would stop her Toprol.  Final report to be be reviewed by Dr Diona Browner when available.

## 2019-09-25 NOTE — Assessment & Plan Note (Signed)
Admitted three times- 3/03-3/11/2019, 09/21/2019 overnight, and 4/03-overnight.  No obvious cause found.

## 2019-09-25 NOTE — Patient Instructions (Signed)
Medication Instructions:   Stop Toprol XL.  Begin Amlodipine 5mg  daily.  Continue all other medications.    Labwork: none  Testing/Procedures: none  Follow-Up: As scheduled   Any Other Special Instructions Will Be Listed Below (If Applicable).  If you need a refill on your cardiac medications before your next appointment, please call your pharmacy.

## 2019-09-25 NOTE — Assessment & Plan Note (Signed)
Prior positive sleep study- unable to tolerate C-pap

## 2019-09-25 NOTE — Telephone Encounter (Signed)
Noted. Agree with recommendations to hold off on TCS.

## 2019-09-25 NOTE — Progress Notes (Signed)
Cardiology Office Note:    Date:  09/25/2019   ID:  Jennifer [name], DOB 08/26/1940, MRN [id number]  PCP:  Monico Blitz, MD  Cardiologist:  Rozann Lesches, MD  Electrophysiologist:  None   Referring MD: Monico Blitz, MD   Excessive somnolence   History of Present Illness:    Jennifer [name] is a 79 y.o. female with a hx of Takotsubo cardiomyopathy.  She presented in June 2014.  Catheterization at that time revealed nonobstructive coronary disease and LV dysfunction.  Follow-up echocardiogram in September 2014 showed her ejection fraction had normalized.  Other medical problems include hypertension, seizure disorder, sleep apnea with CPAP intolerance, and Alzheimer's dementia.    On August 22, 2019 she presented to the emergency room after being found unresponsive at home.  Her primary caregiver felt there was no pulse or breathing and she had brief CPR.  When she got to the emergency room her vital signs were stable and she was awake.  CT scan, MRI, echocardiogram, and cardiac enzymes were all unremarkable.  It was felt she had a UTI and she was discharged 08/25/2019 on antibiotics.  She saw her neurologist as an outpatient and her Keppra was increased, she did have an EEG during her hospitalization that did not suggest increased seizure activity.  On 09/21/2019 she was again brought back to the emergency room after being found "unresponsive".  By the time she got to the emergency room she was awake.  She did receive narcan with no response followed by flumazenil.  By the time she got to the emergency room she was again awake.  Toxicology scan showed no benzo diazepam's or opiates.  She was discharged and brought back to the emergency room late on the second with a similar episode.  She was again kept overnight and then discharged home, this time on Provigil.  When I talked to the patient's primary care taker he described the patient being unresponsive.  He said that she was sitting in a chair and was  mildly sweaty and not answering questions.  Its not like she slumped forward head first onto the table.  I reviewed her monitor which she had placed after she saw Dr. Domenic Polite on 09/06/2019.  The final report is pending, it was just turned in a couple days ago.  From what I did see she does appear to have some bradycardia with heart rates in the 50s.  Today in the office she is sitting upright in a chair with her eyes closed.  She will answer questions and open her eyes and follow commands with some prodding.  Past Medical History:  Diagnosis Date  . Alzheimer's dementia [facility])    per brother by Dr Loretta Plume in [place]  . Anxiety   . Borderline diabetes   . Coronary atherosclerosis of native coronary artery    Nonobstructive at catheterization June 2014  . DDD (degenerative disc disease), lumbar   . Depression   . Essential hypertension   . Hyperlipidemia   . Nephrolithiasis   . Psoriatic arthritis ([place])   . Takotsubo cardiomyopathy    LVEF 60-65% September 2014    Past Surgical History:  Procedure Laterality Date  . APPENDECTOMY    . BACK SURGERY  2014  . CHOLECYSTECTOMY    . LEFT HEART CATHETERIZATION WITH CORONARY ANGIOGRAM N/A 12/18/2012   Procedure: LEFT HEART CATHETERIZATION WITH CORONARY ANGIOGRAM;  Surgeon: Sherren Mocha, MD;  Location: [facility] CATH LAB;  Service: Cardiovascular;  Laterality: N/A;    Current  Medications: Current Meds  Medication Sig  . aspirin 81 MG EC tablet Take 1 tablet (81 mg total) by mouth daily. Swallow whole.  Marland Kitchen atorvastatin (LIPITOR) 10 MG tablet TAKE 1 TABLET AT 6:00 P.M. (Patient taking differently: Take 10 mg by mouth every evening. )  . Biotin 5000 MCG CAPS Take 5,000 mcg by mouth every evening.   . busPIRone (BUSPAR) 10 MG tablet Take 10 mg by mouth 2 (two) times daily.   . calcium carbonate (OSCAL) 1500 (600 Ca) MG TABS tablet Take 600 mg of elemental calcium by mouth 2 (two) times daily with a meal.  . clobetasol cream (TEMOVATE) 0.05 % Apply 1  application topically 2 (two) times daily as needed (for eczema).   . fish oil-omega-3 fatty acids 1000 MG capsule Take 1 g by mouth every evening.   . hydroxypropyl methylcellulose / hypromellose (ISOPTO TEARS / GONIOVISC) 2.5 % ophthalmic solution Place 1 drop into the left eye daily as needed for dry eyes.   . Ixekizumab (TALTZ) 80 MG/ML SOSY Inject 1 Syringe into the skin every 28 (twenty-eight) days.   Marland Kitchen levETIRAcetam (KEPPRA) 1000 MG tablet Take 1 tablet (1,000 mg total) by mouth 2 (two) times daily.  . memantine (NAMENDA XR) 28 MG CP24 24 hr capsule Take 1 capsule (28 mg total) by mouth daily. (Patient taking differently: Take 28 mg by mouth every evening. )  . modafinil (PROVIGIL) 100 MG tablet Take 1 tablet (100 mg total) by mouth daily.  Marland Kitchen PARoxetine (PAXIL) 40 MG tablet Take 40 mg by mouth every evening.   . [DISCONTINUED] metoprolol succinate (TOPROL-XL) 25 MG 24 hr tablet Take 1 tablet (25 mg total) by mouth daily.     Allergies:   Cyclobenzaprine   Social History   Socioeconomic History  . Marital status: Married    Spouse name: Not on file  . Number of children: 2  . Years of education: Not on file  . Highest education level: High school graduate  Occupational History  . Not on file  Tobacco Use  . Smoking status: Never Smoker  . Smokeless tobacco: Never Used  Substance and Sexual Activity  . Alcohol use: No    Alcohol/week: 0.0 standard drinks  . Drug use: No  . Sexual activity: Not Currently  Other Topics Concern  . Not on file  Social History Narrative  . Not on file   Social Determinants of Health   Financial Resource Strain:   . Difficulty of Paying Living Expenses:   Food Insecurity:   . Worried About Programme researcher, broadcasting/film/video in the Last Year:   . Barista in the Last Year:   Transportation Needs:   . Freight forwarder (Medical):   Marland Kitchen Lack of Transportation (Non-Medical):   Physical Activity:   . Days of Exercise per Week:   . Minutes of  Exercise per Session:   Stress:   . Feeling of Stress :   Social Connections:   . Frequency of Communication with Friends and Family:   . Frequency of Social Gatherings with Friends and Family:   . Attends Religious Services:   . Active Member of Clubs or Organizations:   . Attends Banker Meetings:   Marland Kitchen Marital Status:      Family History: The patient's family history includes Breast cancer in her mother; CAD in her father and mother; Cancer in her maternal grandmother; Colon polyps in her brother; Hypertension in her brother and mother. There is  no history of Colon cancer.  ROS:   Please see the history of present illness.     All other systems reviewed and are negative.  EKGs/Labs/Other Studies Reviewed:    The following studies were reviewed today: Echo 08/23/2019 Monitor 09/12/2019- (preliminary) EEG 08/23/2019- no signs of epileptic waveforms on her EEG.  EKG:  EKG is not ordered today.  The ekg ordered 09/22/2019 demonstrates sinus bradycardia-HR 50, QTc 455  Recent Labs: 08/23/2019: TSH 2.385 09/22/2019: ALT 16; BUN 15; Creatinine, Ser 0.59; Hemoglobin 14.0; Platelets 160; Potassium 4.2; Sodium 140  Recent Lipid Panel    Component Value Date/Time   CHOL 152 12/16/2012 0520   TRIG 34 12/16/2012 0520   HDL 57 12/16/2012 0520   CHOLHDL 2.7 12/16/2012 0520   VLDL 7 12/16/2012 0520   LDLCALC 88 12/16/2012 0520    Physical Exam:    VS:  BP (!) 160/70 Comment: has not taken BP med this morning  Pulse (!) 48   Ht 5' (1.524 m)   Wt 129 lb 6.4 oz (58.7 kg)   SpO2 90%   BMI 25.27 kg/m     Wt Readings from Last 3 Encounters:  09/25/19 129 lb 6.4 oz (58.7 kg)  09/22/19 129 lb 6.6 oz (58.7 kg)  09/21/19 130 lb (59 kg)     GEN: Overweight caucasian female, lethargic but responsive, in no acute distress HEENT: Normal NECK: No JVD; No carotid bruits LYMPHATICS: No lymphadenopathy CARDIAC: RRR, slow rate, no murmurs, rubs, gallops RESPIRATORY:  Clear to  auscultation without rales, wheezing or rhonchi  ABDOMEN: Soft, non-tender, non-distended MUSCULOSKELETAL:  No edema; No deformity  SKIN: Warm and dry NEUROLOGIC:  Lethargic but responsive to to verbal commands   ASSESSMENT:    AMS (altered mental status) Pt has excessive somnolence of unclear etiology.  Unlikely this is cardiac but based on my review of her monitor (final report is pending) I would stop her Toprol.  Final report to be be reviewed by Dr Diona Browner when available.   Essential hypertension Stop Toprol- add Norvasc 5 mg daily.   Alzheimer's dementia with behavioral disturbance (HCC) Followed by Dr Adriana Mccallum- Keppra increased after her recent hospitalization earlier in March.   Coronary atherosclerosis of native coronary artery Non obstructive CAD at cath June 2014  Takotsubo syndrome H/O Takotsubo event in June 2014-EF normalized by echo in Sept 2014 Most recent echo 08/23/2019 showed normal LVF-EF 60-65%, no LVH  Syncope and collapse Admitted three times- 3/03-3/11/2019, 09/21/2019 overnight, and 4/03-overnight.  No obvious cause found.   Sleep apnea Prior positive sleep study- unable to tolerate C-pap I doubt this degree of somnolence is secondary to sleep apnea.   PLAN:    Stop Toprol.  Add Norvasc 5 mg for HTN.  Dr Diona Browner to review her monitor results when available- f/u as scheduled.    Medication Adjustments/Labs and Tests Ordered: Current medicines are reviewed at length with the patient today.  Concerns regarding medicines are outlined above.  No orders of the defined types were placed in this encounter.  Meds ordered this encounter  Medications  . amLODipine (NORVASC) 5 MG tablet    Sig: Take 1 tablet (5 mg total) by mouth daily.    Dispense:  30 tablet    Refill:  6    New, stopping Toprol 09/25/2019    Patient Instructions  Medication Instructions:   Stop Toprol XL.  Begin Amlodipine 5mg  daily.  Continue all other medications.     Labwork: none  Testing/Procedures: none  Follow-Up: As scheduled   Any Other Special Instructions Will Be Listed Below (If Applicable).  If you need a refill on your cardiac medications before your next appointment, please call your pharmacy.     Jolene Provost, PA-C  09/25/2019 12:16 PM     [facility]

## 2019-09-25 NOTE — Assessment & Plan Note (Signed)
Stop Toprol- add Norvasc 5 mg daily.

## 2019-09-25 NOTE — Telephone Encounter (Signed)
[phone] patient husband called and said that she has been taken to the er because she was unresponsive a few times and her cardiologist said she should not have the tcs now, should not be put to sleep

## 2019-09-25 NOTE — Assessment & Plan Note (Signed)
Non obstructive CAD at cath June 2014

## 2019-09-26 ENCOUNTER — Telehealth: Payer: Self-pay | Admitting: Neurology

## 2019-09-26 NOTE — Telephone Encounter (Signed)
Patient's husband called to report the patient has been seen twice at the hospital recently for "being whacked out of it." He said she will need a follow up appointment with Dr. Everlena Cooper after he reviews her records.  Routing to triage.

## 2019-09-26 NOTE — Telephone Encounter (Signed)
We can move this patient up on the wait list.

## 2019-09-27 ENCOUNTER — Other Ambulatory Visit (HOSPITAL_COMMUNITY): Payer: Medicare Other

## 2019-09-27 ENCOUNTER — Encounter (HOSPITAL_COMMUNITY): Admission: RE | Admit: 2019-09-27 | Payer: Medicare Other | Source: Ambulatory Visit

## 2019-09-27 LAB — LEVETIRACETAM LEVEL: Levetiracetam Lvl: 28.4 ug/mL (ref 10.0–40.0)

## 2019-09-27 NOTE — Telephone Encounter (Signed)
Returned call and left a message I am adding the patient to our wait list as a high priority.

## 2019-09-28 DIAGNOSIS — G309 Alzheimer's disease, unspecified: Secondary | ICD-10-CM | POA: Diagnosis not present

## 2019-10-01 ENCOUNTER — Ambulatory Visit (HOSPITAL_COMMUNITY): Admit: 2019-10-01 | Payer: Medicare Other | Admitting: Internal Medicine

## 2019-10-01 ENCOUNTER — Encounter (HOSPITAL_COMMUNITY): Payer: Self-pay

## 2019-10-01 SURGERY — COLONOSCOPY WITH PROPOFOL
Anesthesia: Monitor Anesthesia Care

## 2019-10-02 DIAGNOSIS — E1129 Type 2 diabetes mellitus with other diabetic kidney complication: Secondary | ICD-10-CM | POA: Diagnosis not present

## 2019-10-02 DIAGNOSIS — Z299 Encounter for prophylactic measures, unspecified: Secondary | ICD-10-CM | POA: Diagnosis not present

## 2019-10-02 DIAGNOSIS — G47419 Narcolepsy without cataplexy: Secondary | ICD-10-CM | POA: Diagnosis not present

## 2019-10-02 DIAGNOSIS — R809 Proteinuria, unspecified: Secondary | ICD-10-CM | POA: Diagnosis not present

## 2019-10-02 DIAGNOSIS — I10 Essential (primary) hypertension: Secondary | ICD-10-CM | POA: Diagnosis not present

## 2019-10-02 DIAGNOSIS — E1165 Type 2 diabetes mellitus with hyperglycemia: Secondary | ICD-10-CM | POA: Diagnosis not present

## 2019-10-12 ENCOUNTER — Telehealth: Payer: Self-pay | Admitting: *Deleted

## 2019-10-12 DIAGNOSIS — I10 Essential (primary) hypertension: Secondary | ICD-10-CM | POA: Diagnosis not present

## 2019-10-12 DIAGNOSIS — E119 Type 2 diabetes mellitus without complications: Secondary | ICD-10-CM | POA: Diagnosis not present

## 2019-10-12 DIAGNOSIS — E7800 Pure hypercholesterolemia, unspecified: Secondary | ICD-10-CM | POA: Diagnosis not present

## 2019-10-12 NOTE — Telephone Encounter (Signed)
Patient's brother Babette Relic) informed. Copy sent to PCP

## 2019-10-12 NOTE — Telephone Encounter (Signed)
-----   Message from Jonelle Sidle, MD sent at 10/11/2019 10:54 AM EDT ----- Results reviewed.  Please let patient's brother (primary caregiver) know that the heart monitor did not demonstrate any obvious causes for altered mental status or syncope.  She did not have any pauses specifically, nor any sustained arrhythmias.

## 2019-11-13 DIAGNOSIS — Z299 Encounter for prophylactic measures, unspecified: Secondary | ICD-10-CM | POA: Diagnosis not present

## 2019-11-13 DIAGNOSIS — L4050 Arthropathic psoriasis, unspecified: Secondary | ICD-10-CM | POA: Diagnosis not present

## 2019-11-13 DIAGNOSIS — I10 Essential (primary) hypertension: Secondary | ICD-10-CM | POA: Diagnosis not present

## 2019-11-13 DIAGNOSIS — F0390 Unspecified dementia without behavioral disturbance: Secondary | ICD-10-CM | POA: Diagnosis not present

## 2019-11-13 DIAGNOSIS — E1165 Type 2 diabetes mellitus with hyperglycemia: Secondary | ICD-10-CM | POA: Diagnosis not present

## 2019-11-13 DIAGNOSIS — K5900 Constipation, unspecified: Secondary | ICD-10-CM | POA: Diagnosis not present

## 2019-11-13 DIAGNOSIS — R109 Unspecified abdominal pain: Secondary | ICD-10-CM | POA: Diagnosis not present

## 2019-11-18 DIAGNOSIS — E119 Type 2 diabetes mellitus without complications: Secondary | ICD-10-CM | POA: Diagnosis not present

## 2019-11-18 DIAGNOSIS — E7800 Pure hypercholesterolemia, unspecified: Secondary | ICD-10-CM | POA: Diagnosis not present

## 2019-11-18 DIAGNOSIS — I10 Essential (primary) hypertension: Secondary | ICD-10-CM | POA: Diagnosis not present

## 2019-11-28 DIAGNOSIS — Z299 Encounter for prophylactic measures, unspecified: Secondary | ICD-10-CM | POA: Diagnosis not present

## 2019-11-28 DIAGNOSIS — I10 Essential (primary) hypertension: Secondary | ICD-10-CM | POA: Diagnosis not present

## 2019-11-28 DIAGNOSIS — F0390 Unspecified dementia without behavioral disturbance: Secondary | ICD-10-CM | POA: Diagnosis not present

## 2019-11-28 DIAGNOSIS — E1165 Type 2 diabetes mellitus with hyperglycemia: Secondary | ICD-10-CM | POA: Diagnosis not present

## 2019-11-28 DIAGNOSIS — N390 Urinary tract infection, site not specified: Secondary | ICD-10-CM | POA: Diagnosis not present

## 2019-11-28 DIAGNOSIS — R5383 Other fatigue: Secondary | ICD-10-CM | POA: Diagnosis not present

## 2019-11-29 ENCOUNTER — Other Ambulatory Visit: Payer: Self-pay

## 2019-11-29 ENCOUNTER — Emergency Department (HOSPITAL_COMMUNITY)
Admission: EM | Admit: 2019-11-29 | Discharge: 2019-11-29 | Disposition: A | Discharge status: Home / Self Care | Payer: Medicare Other | Attending: Emergency Medicine | Admitting: Emergency Medicine

## 2019-11-29 ENCOUNTER — Emergency Department (HOSPITAL_COMMUNITY): Payer: Medicare Other

## 2019-11-29 ENCOUNTER — Encounter (HOSPITAL_COMMUNITY): Payer: Self-pay | Admitting: Emergency Medicine

## 2019-11-29 DIAGNOSIS — I119 Hypertensive heart disease without heart failure: Secondary | ICD-10-CM | POA: Diagnosis not present

## 2019-11-29 DIAGNOSIS — G47411 Narcolepsy with cataplexy: Secondary | ICD-10-CM | POA: Diagnosis not present

## 2019-11-29 DIAGNOSIS — Z79899 Other long term (current) drug therapy: Secondary | ICD-10-CM | POA: Diagnosis not present

## 2019-11-29 DIAGNOSIS — E119 Type 2 diabetes mellitus without complications: Secondary | ICD-10-CM | POA: Diagnosis not present

## 2019-11-29 DIAGNOSIS — R4020 Unspecified coma: Secondary | ICD-10-CM | POA: Diagnosis not present

## 2019-11-29 DIAGNOSIS — I2510 Atherosclerotic heart disease of native coronary artery without angina pectoris: Secondary | ICD-10-CM | POA: Insufficient documentation

## 2019-11-29 DIAGNOSIS — Z7984 Long term (current) use of oral hypoglycemic drugs: Secondary | ICD-10-CM | POA: Diagnosis not present

## 2019-11-29 DIAGNOSIS — R4182 Altered mental status, unspecified: Secondary | ICD-10-CM | POA: Diagnosis not present

## 2019-11-29 DIAGNOSIS — I10 Essential (primary) hypertension: Secondary | ICD-10-CM | POA: Diagnosis not present

## 2019-11-29 DIAGNOSIS — G309 Alzheimer's disease, unspecified: Secondary | ICD-10-CM | POA: Insufficient documentation

## 2019-11-29 DIAGNOSIS — R0902 Hypoxemia: Secondary | ICD-10-CM | POA: Diagnosis not present

## 2019-11-29 LAB — COMPREHENSIVE METABOLIC PANEL
ALT: 37 U/L (ref 0–44)
AST: 34 U/L (ref 15–41)
Albumin: 4 g/dL (ref 3.5–5.0)
Alkaline Phosphatase: 61 U/L (ref 38–126)
Anion gap: 9 (ref 5–15)
BUN: 17 mg/dL (ref 8–23)
CO2: 30 mmol/L (ref 22–32)
Calcium: 9.3 mg/dL (ref 8.9–10.3)
Chloride: 102 mmol/L (ref 98–111)
Creatinine, Ser: 0.69 mg/dL (ref 0.44–1.00)
GFR calc Af Amer: >60 mL/min (ref >60)
GFR calc non Af Amer: >60 mL/min (ref >60)
Glucose, Bld: 121 mg/dL — ABNORMAL HIGH (ref 70–99)
Potassium: 4.2 mmol/L (ref 3.5–5.1)
Sodium: 141 mmol/L (ref 135–145)
Total Bilirubin: 0.5 mg/dL (ref 0.3–1.2)
Total Protein: 7.4 g/dL (ref 6.5–8.1)

## 2019-11-29 LAB — CBC WITH DIFFERENTIAL/PLATELET
Abs Immature Granulocytes: 0 10*3/uL (ref 0.00–0.07)
Basophils Absolute: 0.1 10*3/uL (ref 0.0–0.1)
Basophils Relative: 1 %
Eosinophils Absolute: 0.1 10*3/uL (ref 0.0–0.5)
Eosinophils Relative: 2 %
HCT: 46.8 % — ABNORMAL HIGH (ref 36.0–46.0)
Hemoglobin: 15.7 g/dL — ABNORMAL HIGH (ref 12.0–15.0)
Immature Granulocytes: 0 %
Lymphocytes Relative: 47 %
Lymphs Abs: 2.4 10*3/uL (ref 0.7–4.0)
MCH: 33.5 pg (ref 26.0–34.0)
MCHC: 33.5 g/dL (ref 30.0–36.0)
MCV: 99.8 fL (ref 80.0–100.0)
Monocytes Absolute: 0.6 10*3/uL (ref 0.1–1.0)
Monocytes Relative: 11 %
Neutro Abs: 2 10*3/uL (ref 1.7–7.7)
Neutrophils Relative %: 39 %
Platelets: 166 10*3/uL (ref 150–400)
RBC: 4.69 MIL/uL (ref 3.87–5.11)
RDW: 12.2 % (ref 11.5–15.5)
WBC: 5.2 10*3/uL (ref 4.0–10.5)
nRBC: 0 % (ref 0.0–0.2)

## 2019-11-29 LAB — URINALYSIS, ROUTINE W REFLEX MICROSCOPIC
Bilirubin Urine: NEGATIVE
Glucose, UA: NEGATIVE mg/dL
Hgb urine dipstick: NEGATIVE
Ketones, ur: 5 mg/dL — AB
Leukocytes,Ua: NEGATIVE
Nitrite: NEGATIVE
Protein, ur: NEGATIVE mg/dL
Specific Gravity, Urine: 1.013 (ref 1.005–1.030)
pH: 6 (ref 5.0–8.0)

## 2019-11-29 LAB — BRAIN NATRIURETIC PEPTIDE: B Natriuretic Peptide: 165 pg/mL — ABNORMAL HIGH (ref 0.0–100.0)

## 2019-11-29 MED ORDER — SODIUM CHLORIDE 0.9 % IV SOLN: INTRAVENOUS | Status: DC

## 2019-11-29 NOTE — ED Provider Notes (Signed)
[facility] EMERGENCY DEPARTMENT Provider Note   CSN: [id number] Arrival date & time: 11/29/19  1709     History Chief Complaint  Patient presents with  . Altered Mental Status    Jennifer [name] is a 79 y.o. female.  Patient brought in by EMS.  Patient was admitted the beginning of April for similar findings.  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  Patient started on modafinil.  With that discharge.  She had an event yesterday another event today.  Where she is difficult to wake up.  Upon arrival here patient was awake and is remained awake.  Family states that she is being treated for urinary tract infection.  Patient without any complaints when she is awake.  She does have a history of Alzheimer's dementia as well.  In addition patient not on home oxygen.  Oxygen sats were in the low 90s.  So the nurse did put her on some nasal cannula oxygen.  Patient has had a sleep study.  And was recommended for CPAP but she could not tolerate it.  So that was stopped.  She has an appointment with Dr. Lauro Regulus in [facility].  And that is still pending.  Since her discharge she has been seen by cardiology and they did cardiac monitoring at home there was no evidence of any arrhythmias.  Patient has had cardiomyopathy in the past.  But did have echoes showing return to normal function with ejection fractions around 65%.  Patient not on any diuretics.        Past Medical History:  Diagnosis Date  . Alzheimer's dementia [facility])    per brother by Dr Loretta Plume in [place]  . Anxiety   . Borderline diabetes   . Coronary atherosclerosis of native coronary artery    Nonobstructive at catheterization June 2014  . DDD (degenerative disc disease), lumbar   . Depression   . Essential hypertension   . Hyperlipidemia   . Nephrolithiasis   . Psoriatic arthritis ([place])   . Takotsubo cardiomyopathy    LVEF 60-65% September 2014    Patient Active Problem List    Diagnosis Date Noted  . Sleep apnea 09/25/2019  . Hypersomnia   . Dementia with behavioral disturbance ([place])   . AMS (altered mental status) 09/22/2019  . History of seizure disorder   . Alzheimer's dementia with behavioral disturbance ([place])   . Syncope and collapse 08/23/2019  . Rectal bleeding 03/14/2019  . Altered awareness, transient 01/06/2015  . Memory loss 01/29/2014  . Coronary atherosclerosis of native coronary artery 03/15/2013  . HLD (hyperlipidemia) 01/11/2013  . Essential hypertension 01/11/2013  . Takotsubo syndrome 12/19/2012  . Nephrolithiasis 12/16/2012  . Type II or unspecified type diabetes mellitus without mention of complication, not stated as uncontrolled 12/16/2012  . Other and unspecified hyperlipidemia 12/16/2012  . Psoriatic arthritis ([place]) 12/16/2012    Past Surgical History:  Procedure Laterality Date  . APPENDECTOMY    . BACK SURGERY  2014  . CHOLECYSTECTOMY    . LEFT HEART CATHETERIZATION WITH CORONARY ANGIOGRAM N/A 12/18/2012   Procedure: LEFT HEART CATHETERIZATION WITH CORONARY ANGIOGRAM;  Surgeon: Sherren Mocha, MD;  Location: [facility] CATH LAB;  Service: Cardiovascular;  Laterality: N/A;     OB History   No obstetric history on file.     Family History  Problem Relation Age of Onset  . CAD Father   . CAD Mother   . Hypertension Mother   . Breast cancer Mother   .  Hypertension Brother         X 2  . Colon polyps Brother        thinks he is on 10 year surveillance program   . Cancer Maternal Grandmother        breast cancer   . Colon cancer Neg Hx     Social History   Tobacco Use  . Smoking status: Never Smoker  . Smokeless tobacco: Never Used  Vaping Use  . Vaping Use: Never used  Substance Use Topics  . Alcohol use: No    Alcohol/week: 0.0 standard drinks  . Drug use: No    Home Medications Prior to Admission medications   Medication Sig Start Date End Date Taking? Authorizing Provider  amLODipine (NORVASC) 5 MG tablet Take  1 tablet (5 mg total) by mouth daily. 09/25/19   Abelino Derrick, PA-C  aspirin 81 MG EC tablet Take 1 tablet (81 mg total) by mouth daily. Swallow whole. 12/19/12   Calvert Cantor, MD  atorvastatin (LIPITOR) 10 MG tablet TAKE 1 TABLET AT 6:00 P.M. Patient taking differently: Take 10 mg by mouth every evening.  04/14/15   Jonelle Sidle, MD  Biotin 5000 MCG CAPS Take 5,000 mcg by mouth every evening.     [provider]  busPIRone (BUSPAR) 10 MG tablet Take 10 mg by mouth 2 (two) times daily.  03/02/19   [provider]  calcium carbonate (OSCAL) 1500 (600 Ca) MG TABS tablet Take 600 mg of elemental calcium by mouth 2 (two) times daily with a meal.    [provider]  clobetasol cream (TEMOVATE) 0.05 % Apply 1 application topically 2 (two) times daily as needed (for eczema).     [provider]  fish oil-omega-3 fatty acids 1000 MG capsule Take 1 g by mouth every evening.     [provider]  hydroxypropyl methylcellulose / hypromellose (ISOPTO TEARS / GONIOVISC) 2.5 % ophthalmic solution Place 1 drop into the left eye daily as needed for dry eyes.     [provider]  Ixekizumab (TALTZ) 80 MG/ML SOSY Inject 1 Syringe into the skin every 28 (twenty-eight) days.  05/16/18   [provider]  levETIRAcetam (KEPPRA) 1000 MG tablet Take 1 tablet (1,000 mg total) by mouth 2 (two) times daily. 09/22/19   Devoria Albe, MD  memantine (NAMENDA XR) 28 MG CP24 24 hr capsule Take 1 capsule (28 mg total) by mouth daily. Patient taking differently: Take 28 mg by mouth every evening.  04/12/17   Everlena Cooper, Adam R, DO  modafinil (PROVIGIL) 100 MG tablet Take 1 tablet (100 mg total) by mouth daily. 09/24/19   Vassie Loll, MD  PARoxetine (PAXIL) 40 MG tablet Take 40 mg by mouth every evening.     [provider]    Allergies    Cyclobenzaprine  Review of Systems   Review of Systems  Unable to perform ROS: Dementia    Physical Exam Updated Vital  Signs BP (!) 194/78 (BP Location: Right Arm)   Pulse 70   Temp 97.6 F (36.4 C) (Oral)   Resp 12   Ht 1.626 m (5\' 4" )   Wt 68 kg   SpO2 92%   BMI 25.75 kg/m   Physical Exam Vitals and nursing note reviewed.  Constitutional:      General: She is not in acute distress.    Appearance: She is well-developed.  HENT:     Head: Normocephalic and atraumatic.  Eyes:  Extraocular Movements: Extraocular movements intact.     Conjunctiva/sclera: Conjunctivae normal.     Pupils: Pupils are equal, round, and reactive to light.  Cardiovascular:     Rate and Rhythm: Normal rate and regular rhythm.     Heart sounds: No murmur heard.   Pulmonary:     Effort: Pulmonary effort is normal. No respiratory distress.     Breath sounds: Normal breath sounds.  Abdominal:     Palpations: Abdomen is soft.     Tenderness: There is no abdominal tenderness.  Musculoskeletal:        General: No swelling.     Cervical back: Normal range of motion and neck supple.  Skin:    General: Skin is warm and dry.     Capillary Refill: Capillary refill takes less than 2 seconds.  Neurological:     General: No focal deficit present.     Mental Status: She is alert. Mental status is at baseline.     Cranial Nerves: No cranial nerve deficit.     Sensory: No sensory deficit.     Motor: No weakness.     ED Results / Procedures / Treatments   Labs (all labs ordered are listed, but only abnormal results are displayed) Labs Reviewed - No data to display  EKG None  Radiology No results found.  Procedures Procedures (including critical care time)  Medications Ordered in ED Medications - No data to display  ED Course  I have reviewed the triage vital signs and the nursing notes.  Pertinent labs & imaging results that were available during my care of the patient were reviewed by me and considered in my medical decision making (see chart for details).    MDM Rules/Calculators/A&P                           Although not an expert on it that does seem it very well could be consistent with narcolepsy.  There was some concern it could be some kind of unusual seizure and patient was on Keppra.  Does not completely ruled out.  She has an appointment pending with her neurologist.  And their input will be important.  Cardiology is already ruled out significant arrhythmias as the cause.  So I think it comes down to her neurological diagnosis.  Patient also has had head CTs and MRIs recently without any acute findings.  No evidence of urinary tract infection here today.  Labs without significant abnormalities.  BNP was elevated a little bit at 160 chest x-ray may be showed a little bit of vascular congestion.  But no florid congestive heart failure.  Would recommend follow-up with cardiology to have this addressed and may be consideration for echocardiogram again.  Would continue all current medications until seen by neurology.  Urinalysis negative here today.  No evidence for urinary tract infection.  Patient's oxygen levels remained 90 to 92% on room air while she is awake.  Stable for discharge home.   Final Clinical Impression(s) / ED Diagnoses Final diagnoses:  None    Rx / DC Orders ED Discharge Orders    None       Vanetta Mulders, MD 11/29/19 2104

## 2019-11-29 NOTE — ED Notes (Signed)
Pt becoming more alert and following commands during triage. edp at bedside.

## 2019-11-29 NOTE — ED Triage Notes (Signed)
Per EMS, pt has history of same. Pt being treated for narcolepsy. Pt room air saturation 90%. cbg wnl, vital signs stable with EMS. Pt responds to sternal rub and calling name. Per EMS, triage ammonia inhalant with no arousal. Pt has been given flumazenil during past episodes. Pt recently being treated for UTI.

## 2019-11-29 NOTE — Discharge Instructions (Addendum)
Return for any new or worse symptoms.  I do think that this may have a component of narcolepsy associated with it.  Very important that she follow-up with neurology.  Also today her BNP was elevated a little bit would recommend follow back up to cardiology for recheck of that.  Do not feel that she needs to be started on Lasix.  BNP just slightly elevated.  But they may want to do echocardiogram again.  Continue current medications.  Work-up without any significant findings otherwise.

## 2019-11-30 ENCOUNTER — Telehealth: Payer: Self-pay | Admitting: Cardiology

## 2019-11-30 ENCOUNTER — Telehealth: Payer: Self-pay | Admitting: Neurology

## 2019-11-30 NOTE — Telephone Encounter (Signed)
Let's move her up.

## 2019-11-30 NOTE — Telephone Encounter (Signed)
Spoke with Fredrik Cove and let him know we have the patient on a wait list and we will call as soon as we have something sooner than her appt

## 2019-11-30 NOTE — Telephone Encounter (Signed)
Per ED note:  BNP was elevated a little bit at 160 chest x-ray may be showed a little bit of vascular congestion.  But no florid congestive heart failure.  Would recommend follow-up with cardiology to have this addressed and may be consideration for echocardiogram again.

## 2019-11-30 NOTE — Telephone Encounter (Signed)
Mr. Souter called stating that patient was seen in the ER on 11/29/2019 [facility]. He was told to contact our office to see if patient needs to follow up sooner than July 1.

## 2019-11-30 NOTE — Telephone Encounter (Signed)
She just had an echocardiogram in March.  Not certain that the mildly elevated BNP necessarily requires follow-up imaging, please schedule her to see Mardelle Matte in the next few weeks for review of medications to see if anything needs to be adjusted.

## 2019-11-30 NOTE — Telephone Encounter (Signed)
Left message on the VM stating that the patient had to be transported to [facility] by EMS yesterday. She was unresponsive and he could not wake her. When they got to [facility] they ran a bunch of test on her.  She has a appointment with Everlena Cooper on 01-15-20. They wanted you to know what was going on and to see if she needed to be seen sooner than 01-15-20

## 2019-11-30 NOTE — Telephone Encounter (Signed)
Babette Relic informed and verbalized understanding of plan. Will keep already scheduled visit with Diona Browner on 12/20/2019.

## 2019-12-04 DIAGNOSIS — Z1331 Encounter for screening for depression: Secondary | ICD-10-CM | POA: Diagnosis not present

## 2019-12-04 DIAGNOSIS — E119 Type 2 diabetes mellitus without complications: Secondary | ICD-10-CM | POA: Diagnosis not present

## 2019-12-04 DIAGNOSIS — Z1211 Encounter for screening for malignant neoplasm of colon: Secondary | ICD-10-CM | POA: Diagnosis not present

## 2019-12-04 DIAGNOSIS — I10 Essential (primary) hypertension: Secondary | ICD-10-CM | POA: Diagnosis not present

## 2019-12-04 DIAGNOSIS — R5383 Other fatigue: Secondary | ICD-10-CM | POA: Diagnosis not present

## 2019-12-04 DIAGNOSIS — E7800 Pure hypercholesterolemia, unspecified: Secondary | ICD-10-CM | POA: Diagnosis not present

## 2019-12-04 DIAGNOSIS — Z6825 Body mass index (BMI) 25.0-25.9, adult: Secondary | ICD-10-CM | POA: Diagnosis not present

## 2019-12-04 DIAGNOSIS — E559 Vitamin D deficiency, unspecified: Secondary | ICD-10-CM | POA: Diagnosis not present

## 2019-12-04 DIAGNOSIS — Z1339 Encounter for screening examination for other mental health and behavioral disorders: Secondary | ICD-10-CM | POA: Diagnosis not present

## 2019-12-04 DIAGNOSIS — Z7189 Other specified counseling: Secondary | ICD-10-CM | POA: Diagnosis not present

## 2019-12-04 DIAGNOSIS — Z299 Encounter for prophylactic measures, unspecified: Secondary | ICD-10-CM | POA: Diagnosis not present

## 2019-12-04 DIAGNOSIS — Z0000 Encounter for general adult medical examination without abnormal findings: Secondary | ICD-10-CM | POA: Diagnosis not present

## 2019-12-04 NOTE — Progress Notes (Signed)
NEUROLOGY FOLLOW UP OFFICE NOTE  Jennifer [name] [id number]  HISTORY OF PRESENT ILLNESS: Jennifer [name] is a 79 year old right-handed woman with hypertension,Takotsubo cardiomyopathyand hyperlipidemia who follows up for dementia and recurrent episodes of unresponsiveness.  Hospital and ED notes reviewed.  She is accompanied by her brother who also supplements history.  UPDATE: Since last visit, she has been to the hospital on 3 separate occasions for unresponsiveness.  She presented to the [facility] ED on 09/21/2019 for episode of unresponsiveness, in which she had her eyes closed and would grunt but not verbally respond.  Blood pressure was 128/50s.  When EMS arrived and stuck her finger for blood sugar, she began talking.  Event lasted about 30 to 40 minutes.  Keppra was increased to 1000mg  twice daily and she was discharged.  The next morning, she was sitting in a chair when she suddenly closed her eyes and became unresponsiveness.  No jerking activity.    She was admitted to [facility] where she was started on Provigil 100mg  in the AM for hypersomnia.  It has not been effective.  Her PCP suggested increasing dose to 200mg .  She presented to [facility] ED again on 11/29/2019 for two more episodes of unresponsiveness.   She didn't respond to Narcan but apparently woke up after given flumazenil, however she had not been taking any benzodiazepine.    HISTORY: She began having noticeable memory problems in late 2014 after some medical issues, such as kidney stones, MI and back surgery. She began having problems remembering how to get to familiar places, such as the dentist or friend's house. She also reports difficulty remembering names of people she has known for years. She denies difficulty recognizing faces. She denies hallucinations, poor sleep or depression. She has not exhibited episodes of agitation or acute confusion. Her brother lives with her. Her son lives next door.  Her brother now handles her bills and finances. She no longer drives. She performs all of her ADLs and keeps the house tidy. She exhibits no change in behavior or personality.  In May 2016, she had an episode of unresponsiveness lasting several minutes. She was with her son when suddenly her head dropped with eyes still open but zoned out. She was unresponsive and did not talk. She did not exhibit any twitching or incontinence. She had an MRI of the brain performed on 11/13/14 which showed age appropriate atrophy and mild chronic small vessel disease. Carotid doppler performed on 11/25/14 showed less than 50% bilateral ICA stenosis. She has had two other similar spells only lasting about 30 seconds. EEG performed on 01/15/15, which was normal. She had another spell in on 08/24/16. She presented to the [facility] ED for further evaluation. MRI of brain revealed mild chronic small vessel disease but no acute stroke or mass lesion. Aricept was discontinued because it may lower seizure threshold.  She was admitted to [facility] on 08/22/2019 after found on the floor passed out.  She was arguing with her brother.  She got on the floor and crawled to the corner.  Her brother left to get her son.  When they returned after a couple of minutes, she was unresponsive on the floor. Her son thought she wasn't breathing and proceeded with CPR but she woke up after a minute, however I suspect that she was not in respiratory arrest.  She was awake when EMS arrived.  She did not bite her tongue or have incontinence.  CT head was  unremarkable.  Cardiac enzymes (tropinin) were negative.  Echocardiogram showed EF 60-65% with grade 1 diastolic dysfunction.   EEG showed diffuse slowing but no evidence of seizure.  She was found to have a UTI and started on antibiotics.  Subsequent outpatient Zio patch cardiac monitoring was negative for arrhythmia.    She has history of OSA but unable to tolerate CPAP.  Both  parents died in their 11s and had no history of dementia.  PAST MEDICAL HISTORY: Past Medical History:  Diagnosis Date  . Alzheimer's dementia [facility])    per brother by Dr Loretta Plume in [place]  . Anxiety   . Borderline diabetes   . Coronary atherosclerosis of native coronary artery    Nonobstructive at catheterization June 2014  . DDD (degenerative disc disease), lumbar   . Depression   . Essential hypertension   . Hyperlipidemia   . Nephrolithiasis   . Psoriatic arthritis ([place])   . Takotsubo cardiomyopathy    LVEF 60-65% September 2014    MEDICATIONS: Current Outpatient Medications on File Prior to Visit  Medication Sig Dispense Refill  . amLODipine (NORVASC) 5 MG tablet Take 1 tablet (5 mg total) by mouth daily. 30 tablet 6  . aspirin 81 MG EC tablet Take 1 tablet (81 mg total) by mouth daily. Swallow whole. 30 tablet 12  . atorvastatin (LIPITOR) 10 MG tablet TAKE 1 TABLET AT 6:00 P.M. (Patient taking differently: Take 10 mg by mouth every evening. ) 30 tablet 3  . Biotin 5000 MCG CAPS Take 5,000 mcg by mouth every evening.     . busPIRone (BUSPAR) 10 MG tablet Take 10 mg by mouth 2 (two) times daily.     . calcium carbonate (OSCAL) 1500 (600 Ca) MG TABS tablet Take 600 mg of elemental calcium by mouth 2 (two) times daily with a meal.    . clobetasol cream (TEMOVATE) 1.61 % Apply 1 application topically 2 (two) times daily as needed (for eczema).     . fish oil-omega-3 fatty acids 1000 MG capsule Take 1 g by mouth every evening.     . hydroxypropyl methylcellulose / hypromellose (ISOPTO TEARS / GONIOVISC) 2.5 % ophthalmic solution Place 1 drop into the left eye daily as needed for dry eyes.     . Ixekizumab (TALTZ) 80 MG/ML SOSY Inject 1 Syringe into the skin every 28 (twenty-eight) days.     Marland Kitchen levETIRAcetam (KEPPRA) 1000 MG tablet Take 1 tablet (1,000 mg total) by mouth 2 (two) times daily. 60 tablet 0  . memantine (NAMENDA XR) 28 MG CP24 24 hr capsule Take 1 capsule (28 mg total)  by mouth daily. (Patient taking differently: Take 28 mg by mouth every evening. ) 30 capsule 5  . modafinil (PROVIGIL) 100 MG tablet Take 1 tablet (100 mg total) by mouth daily. 30 tablet 1  . PARoxetine (PAXIL) 40 MG tablet Take 40 mg by mouth every evening.      No current facility-administered medications on file prior to visit.    ALLERGIES: Allergies  Allergen Reactions  . Cyclobenzaprine     TOO STRONG  CAN'T USE AND WORK    FAMILY HISTORY: Family History  Problem Relation Age of Onset  . CAD Father   . CAD Mother   . Hypertension Mother   . Breast cancer Mother   . Hypertension Brother         X 2  . Colon polyps Brother        thinks he is on 15 year  surveillance program   . Cancer Maternal Grandmother        breast cancer   . Colon cancer Neg Hx    SOCIAL HISTORY: Social History   Socioeconomic History  . Marital status: Married    Spouse name: Not on file  . Number of children: 2  . Years of education: Not on file  . Highest education level: High school graduate  Occupational History  . Not on file  Tobacco Use  . Smoking status: Never Smoker  . Smokeless tobacco: Never Used  Vaping Use  . Vaping Use: Never used  Substance and Sexual Activity  . Alcohol use: No    Alcohol/week: 0.0 standard drinks  . Drug use: No  . Sexual activity: Not Currently  Other Topics Concern  . Not on file  Social History Narrative  . Not on file   Social Determinants of Health   Financial Resource Strain:   . Difficulty of Paying Living Expenses:   Food Insecurity:   . Worried About Programme researcher, broadcasting/film/video in the Last Year:   . Barista in the Last Year:   Transportation Needs:   . Freight forwarder (Medical):   Marland Kitchen Lack of Transportation (Non-Medical):   Physical Activity:   . Days of Exercise per Week:   . Minutes of Exercise per Session:   Stress:   . Feeling of Stress :   Social Connections:   . Frequency of Communication with Friends and Family:     . Frequency of Social Gatherings with Friends and Family:   . Attends Religious Services:   . Active Member of Clubs or Organizations:   . Attends Banker Meetings:   Marland Kitchen Marital Status:   Intimate Partner Violence:   . Fear of Current or Ex-Partner:   . Emotionally Abused:   Marland Kitchen Physically Abused:   . Sexually Abused:     PHYSICAL EXAM: Blood pressure (!) 155/78, pulse 82, weight 128 lb (58.1 kg), SpO2 95 %. General: No acute distress.  Patient appears well-groomed.   Head:  Normocephalic/atraumatic Eyes:  Fundi examined but not visualized Neck: supple, no paraspinal tenderness, full range of motion Heart:  Regular rate and rhythm Lungs:  Clear to auscultation bilaterally Back: No paraspinal tenderness Neurological Exam: alert and oriented to person only. Attention span and concentration poor, recent and remote memory poor, fund of knowledge poor.  Speech fluent and not dysarthric, language intact.  CN II-XII intact. Bulk and tone normal, muscle strength 5/5 throughout.  Sensation to light touch intact.  Deep tendon reflexes 2+ throughout.  Finger to nosetesting intact.  Unsteady, ambulates with walker  IMPRESSION: 1.  Major neurocognitive disorder.  Likely Alzheimer's dementia 2.  Recurrent episodes of unresponsiveness.  I believe this is likely behavioral, related to her dementia.  Some underlying sleep disorder is possible, maybe related to underlying OSA.  Unusual age of onset for narcolepsy.  I do not think these are seizures.  PLAN: 1.  Discontinue Keppra 2.  Refer to sleep medicine for possible underlying sleep disorder. 3.  Continue Namenda XR 28mg  daily 4.  At this time, I would not increase modafinil until evaluation with sleep medicine. 5.  If she should have another habitual event, I explained that she does not need to be taken to the ED as these events do not seem life-threatening.  Only take to the ED if there is a change in usual presentation. 6.  Follow up  in 6 months.  Shon Millet, DO  CC: Kirstie Peri, MD

## 2019-12-05 ENCOUNTER — Ambulatory Visit (INDEPENDENT_AMBULATORY_CARE_PROVIDER_SITE_OTHER): Payer: Medicare Other | Admitting: Neurology

## 2019-12-05 ENCOUNTER — Other Ambulatory Visit: Payer: Self-pay

## 2019-12-05 ENCOUNTER — Encounter: Payer: Self-pay | Admitting: Neurology

## 2019-12-05 VITALS — BP 155/78 | HR 82 | Wt 128.0 lb

## 2019-12-05 DIAGNOSIS — G301 Alzheimer's disease with late onset: Secondary | ICD-10-CM

## 2019-12-05 DIAGNOSIS — R6889 Other general symptoms and signs: Secondary | ICD-10-CM

## 2019-12-05 DIAGNOSIS — I2510 Atherosclerotic heart disease of native coronary artery without angina pectoris: Secondary | ICD-10-CM | POA: Diagnosis not present

## 2019-12-05 DIAGNOSIS — F0280 Dementia in other diseases classified elsewhere without behavioral disturbance: Secondary | ICD-10-CM

## 2019-12-05 NOTE — Patient Instructions (Addendum)
I don't think these are seizures.  It is either behavioral or part of a sleep disorder. 1.  Stop the Keppra 2.  Do not go up on modafinil  3.  I will refer you to sleep specialist. [facility]  4.  Follow up in 6 months.

## 2019-12-06 ENCOUNTER — Encounter: Payer: Self-pay | Admitting: Cardiology

## 2019-12-06 DIAGNOSIS — R202 Paresthesia of skin: Secondary | ICD-10-CM | POA: Diagnosis not present

## 2019-12-06 DIAGNOSIS — E7800 Pure hypercholesterolemia, unspecified: Secondary | ICD-10-CM | POA: Diagnosis not present

## 2019-12-06 DIAGNOSIS — Z79899 Other long term (current) drug therapy: Secondary | ICD-10-CM | POA: Diagnosis not present

## 2019-12-06 DIAGNOSIS — E559 Vitamin D deficiency, unspecified: Secondary | ICD-10-CM | POA: Diagnosis not present

## 2019-12-06 DIAGNOSIS — R5383 Other fatigue: Secondary | ICD-10-CM | POA: Diagnosis not present

## 2019-12-18 ENCOUNTER — Ambulatory Visit: Payer: Medicare Other | Admitting: Neurology

## 2019-12-19 DIAGNOSIS — F0390 Unspecified dementia without behavioral disturbance: Secondary | ICD-10-CM | POA: Diagnosis not present

## 2019-12-19 DIAGNOSIS — I739 Peripheral vascular disease, unspecified: Secondary | ICD-10-CM | POA: Diagnosis not present

## 2019-12-19 DIAGNOSIS — E119 Type 2 diabetes mellitus without complications: Secondary | ICD-10-CM | POA: Diagnosis not present

## 2019-12-19 DIAGNOSIS — I10 Essential (primary) hypertension: Secondary | ICD-10-CM | POA: Diagnosis not present

## 2019-12-19 DIAGNOSIS — Z299 Encounter for prophylactic measures, unspecified: Secondary | ICD-10-CM | POA: Diagnosis not present

## 2019-12-19 DIAGNOSIS — E7800 Pure hypercholesterolemia, unspecified: Secondary | ICD-10-CM | POA: Diagnosis not present

## 2019-12-19 DIAGNOSIS — R040 Epistaxis: Secondary | ICD-10-CM | POA: Diagnosis not present

## 2019-12-19 DIAGNOSIS — E1165 Type 2 diabetes mellitus with hyperglycemia: Secondary | ICD-10-CM | POA: Diagnosis not present

## 2019-12-20 ENCOUNTER — Encounter: Payer: Self-pay | Admitting: Cardiology

## 2019-12-20 ENCOUNTER — Ambulatory Visit (INDEPENDENT_AMBULATORY_CARE_PROVIDER_SITE_OTHER): Payer: Medicare Other | Admitting: Cardiology

## 2019-12-20 ENCOUNTER — Other Ambulatory Visit: Payer: Self-pay

## 2019-12-20 VITALS — BP 142/78 | HR 74 | Ht 60.0 in | Wt 125.0 lb

## 2019-12-20 DIAGNOSIS — I10 Essential (primary) hypertension: Secondary | ICD-10-CM | POA: Diagnosis not present

## 2019-12-20 DIAGNOSIS — I2510 Atherosclerotic heart disease of native coronary artery without angina pectoris: Secondary | ICD-10-CM

## 2019-12-20 DIAGNOSIS — Z8679 Personal history of other diseases of the circulatory system: Secondary | ICD-10-CM

## 2019-12-20 NOTE — Progress Notes (Signed)
Cardiology Office Note  Date: 12/20/2019   ID: Jennifer [name], DOB [birth year], MRN [id number]  PCP:  Kirstie Peri, MD  Cardiologist:  Nona Dell, MD Electrophysiologist:  None   Chief Complaint  Patient presents with  . Cardiac follow-up    History of Present Illness: Jennifer [name] is a 79 y.o. female last seen in April by Mr. Lenn Cal.  She is here today with her brother for a follow-up visit. ER visit noted in mid June, I reviewed the note and testing.  She continues to follow with neurology, medications have been adjusted as reviewed below.  The patient's brother feels like she is reasonably stable, has lost some stamina however.  From a cardiac perspective she has not had any evidence of symptomatic arrhythmias. Cardiac monitor from March is outlined below, no significant arrhythmias or pauses.  She is currently on bisoprolol, Lipitor, and Norvasc.  She has lost weight, seems to be related to decreased caloric intake per discussion with the patient's brother.  I reviewed her recent lab work per PCP.  Past Medical History:  Diagnosis Date  . Alzheimer's dementia [facility])    Dr Adriana Mccallum in [place]  . Anxiety   . Borderline diabetes   . Coronary atherosclerosis of native coronary artery    Nonobstructive at catheterization June 2014  . DDD (degenerative disc disease), lumbar   . Depression   . Essential hypertension   . Hyperlipidemia   . Nephrolithiasis   . Psoriatic arthritis ([facility])   . Takotsubo cardiomyopathy    LVEF 60-65% September 2014    Past Surgical History:  Procedure Laterality Date  . APPENDECTOMY    . BACK SURGERY  2014  . CHOLECYSTECTOMY    . LEFT HEART CATHETERIZATION WITH CORONARY ANGIOGRAM N/A 12/18/2012   Procedure: LEFT HEART CATHETERIZATION WITH CORONARY ANGIOGRAM;  Surgeon: Tonny Bollman, MD;  Location[facility] C[facility] LAB;  Service: Cardiovascular;  Laterality: N/A;    Current Outpatient Medications  Medication Sig Dispense Refill  .  amLODipine (NORVASC) 5 MG tablet Take 1 tablet (5 mg total) by mouth daily. 30 tablet 6  . atorvastatin (LIPITOR) 10 MG tablet TAKE 1 TABLET AT 6:00 P.M. (Patient taking differently: Take 10 mg by mouth every evening. ) 30 tablet 3  . Biotin 5000 MCG CAPS Take 5,000 mcg by mouth every evening.     . bisoprolol (ZEBETA) 5 MG tablet Take 5 mg by mouth daily.    . calcium carbonate (OSCAL) 1500 (600 Ca) MG TABS tablet Take 600 mg of elemental calcium by mouth 2 (two) times daily with a meal.    . clobetasol cream (TEMOVATE) 0.05 % Apply 1 application topically 2 (two) times daily as needed (for eczema).     . fish oil-omega-3 fatty acids 1000 MG capsule Take 1 g by mouth every evening.     . hydroxypropyl methylcellulose / hypromellose (ISOPTO TEARS / GONIOVISC) 2.5 % ophthalmic solution Place 1 drop into the left eye daily as needed for dry eyes.     . Ixekizumab (TALTZ) 80 MG/ML SOSY Inject 1 Syringe into the skin every 28 (twenty-eight) days.     . memantine (NAMENDA XR) 28 MG CP24 24 hr capsule Take 1 capsule (28 mg total) by mouth daily. (Patient taking differently: Take 28 mg by mouth every evening. ) 30 capsule 5  . modafinil (PROVIGIL) 100 MG tablet Take 1 tablet (100 mg total) by mouth daily. 30 tablet 1  . PARoxetine (PAXIL) 40 MG tablet Take  40 mg by mouth every evening.      No current facility-administered medications for this visit.   Allergies:  Cyclobenzaprine   ROS:   Progressive memory loss, degree stamina.  Physical Exam: VS:  BP (!) 142/78   Pulse 74   Ht 5' (1.524 m)   Wt 125 lb (56.7 kg)   SpO2 93%   BMI 24.41 kg/m , BMI Body mass index is 24.41 kg/m.  Wt Readings from Last 3 Encounters:  12/20/19 125 lb (56.7 kg)  12/05/19 128 lb (58.1 kg)  11/29/19 150 lb (68 kg)    General: Elderly woman, appears comfortable at rest. HEENT: Conjunctiva and lids normal, wearing a mask. Neck: Supple, no elevated JVP or carotid bruits, no thyromegaly. Lungs: Clear to  auscultation, nonlabored breathing at rest. Cardiac: Regular rate and rhythm, no S3 or significant systolic murmur, no pericardial rub. Extremities: No pitting edema.  ECG:  An ECG dated 11/29/2019 was personally reviewed today and demonstrated:  Sinus rhythm with left anterior fascicular block, LVH with decreased R wave progression and nonspecific T wave changes.  Recent Labwork: 08/23/2019: TSH 2.385 11/29/2019: ALT 37; AST 34; B Natriuretic Peptide 165.0; BUN 17; Creatinine, Ser 0.69; Hemoglobin 15.7; Platelets 166; Potassium 4.2; Sodium 141     Component Value Date/Time   CHOL 152 12/16/2012 0520   TRIG 34 12/16/2012 0520   HDL 57 12/16/2012 0520   CHOLHDL 2.7 12/16/2012 0520   VLDL 7 12/16/2012 0520   LDLCALC 88 12/16/2012 0520    Other Studies Reviewed Today:  Cardiac monitor 09/12/2019: ZIO AT reviewed.  8 days 4 hours analyzed.  Predominant rhythm is sinus.  Heart rate ranged from 48 bpm up to 88 bpm with average heart rate 59 bpm.  Only rare PACs and PVCs were noted representing less than 1% of total beats.  There were no sustained arrhythmias or pauses.  Echocardiogram 08/23/2019: 1. Left ventricular ejection fraction, by estimation, is 60 to 65%. The  left ventricle has normal function. The left ventricle has no regional  wall motion abnormalities. Left ventricular diastolic parameters are  consistent with Grade I diastolic  dysfunction (impaired relaxation).  2. Right ventricular systolic function is normal. The right ventricular  size is normal.  3. The mitral valve is normal in structure and function. No evidence of  mitral valve regurgitation. No evidence of mitral stenosis.  4. The aortic valve is tricuspid. Aortic valve regurgitation is not  visualized. No aortic stenosis is present.  5. The inferior vena cava IVC is small, suggesting low RA pressure.  Chest x-ray 11/29/2019: FINDINGS: The heart size and mediastinal contours are within normal  limits. Atherosclerotic calcification of the aortic knob. Mildly prominent bibasilar interstitial markings, increased from prior. No pleural effusion or pneumothorax. The visualized skeletal structures are unremarkable.  IMPRESSION: Mildly prominent bibasilar interstitial markings, increased from prior, may reflect mild edema versus atypical/viral infection.  Assessment and Plan:  1.  History of stress-induced cardiomyopathy.  LVEF remains normal by follow-up echocardiogram in March, 60 to 65% range with mild diastolic dysfunction.  The minimally elevated BNP from recent ER visit does not look to be of major clinical significance.  I would be reluctant to start her on diuretic therapy with continued weight loss and some evidence of intravascular volume contraction by her recent lab work per PCP.  Continue Norvasc and bisoprolol.  2.  Essential hypertension, not being too aggressive with blood pressure lowering, systolic is in the 140s today.  Continue Norvasc and  bisoprolol.  3.  Dementia.  Also question of narcolepsy/OSA per chart review.  Patient's brother indicates that she is scheduled for sleep testing per her neurologist.  Medication Adjustments/Labs and Tests Ordered: Current medicines are reviewed at length with the patient today.  Concerns regarding medicines are outlined above.   Tests Ordered: No orders of the defined types were placed in this encounter.   Medication Changes: No orders of the defined types were placed in this encounter.   Disposition:  Follow up 6 months in the [place] office.  Signed, Jonelle Sidle, MD, [facility] 12/20/2019 3:45 PM    [facility] at [facility] [address] [place], [place], [place] [address] Phone: [phone]; Fax: [phone]

## 2019-12-20 NOTE — Patient Instructions (Addendum)

## 2020-01-15 ENCOUNTER — Ambulatory Visit: Payer: Medicare Other | Admitting: Neurology

## 2020-01-18 DIAGNOSIS — E119 Type 2 diabetes mellitus without complications: Secondary | ICD-10-CM | POA: Diagnosis not present

## 2020-01-18 DIAGNOSIS — I10 Essential (primary) hypertension: Secondary | ICD-10-CM | POA: Diagnosis not present

## 2020-01-18 DIAGNOSIS — E7800 Pure hypercholesterolemia, unspecified: Secondary | ICD-10-CM | POA: Diagnosis not present

## 2020-01-28 DIAGNOSIS — E119 Type 2 diabetes mellitus without complications: Secondary | ICD-10-CM | POA: Diagnosis not present

## 2020-01-28 DIAGNOSIS — I10 Essential (primary) hypertension: Secondary | ICD-10-CM | POA: Diagnosis not present

## 2020-01-28 DIAGNOSIS — E7800 Pure hypercholesterolemia, unspecified: Secondary | ICD-10-CM | POA: Diagnosis not present

## 2020-02-13 ENCOUNTER — Telehealth: Payer: Medicare Other | Admitting: Cardiology

## 2020-03-20 DIAGNOSIS — E7800 Pure hypercholesterolemia, unspecified: Secondary | ICD-10-CM | POA: Diagnosis not present

## 2020-03-20 DIAGNOSIS — E119 Type 2 diabetes mellitus without complications: Secondary | ICD-10-CM | POA: Diagnosis not present

## 2020-03-20 DIAGNOSIS — I10 Essential (primary) hypertension: Secondary | ICD-10-CM | POA: Diagnosis not present

## 2020-03-25 DIAGNOSIS — I739 Peripheral vascular disease, unspecified: Secondary | ICD-10-CM | POA: Diagnosis not present

## 2020-03-25 DIAGNOSIS — Z111 Encounter for screening for respiratory tuberculosis: Secondary | ICD-10-CM | POA: Diagnosis not present

## 2020-03-25 DIAGNOSIS — I10 Essential (primary) hypertension: Secondary | ICD-10-CM | POA: Diagnosis not present

## 2020-03-25 DIAGNOSIS — Z299 Encounter for prophylactic measures, unspecified: Secondary | ICD-10-CM | POA: Diagnosis not present

## 2020-03-25 DIAGNOSIS — E1165 Type 2 diabetes mellitus with hyperglycemia: Secondary | ICD-10-CM | POA: Diagnosis not present

## 2020-04-12 DIAGNOSIS — F0280 Dementia in other diseases classified elsewhere without behavioral disturbance: Secondary | ICD-10-CM | POA: Diagnosis not present

## 2020-04-12 DIAGNOSIS — K5730 Diverticulosis of large intestine without perforation or abscess without bleeding: Secondary | ICD-10-CM | POA: Diagnosis not present

## 2020-04-12 DIAGNOSIS — I10 Essential (primary) hypertension: Secondary | ICD-10-CM | POA: Diagnosis not present

## 2020-04-12 DIAGNOSIS — R0902 Hypoxemia: Secondary | ICD-10-CM | POA: Diagnosis not present

## 2020-04-12 DIAGNOSIS — R9431 Abnormal electrocardiogram [ECG] [EKG]: Secondary | ICD-10-CM | POA: Diagnosis not present

## 2020-04-12 DIAGNOSIS — K449 Diaphragmatic hernia without obstruction or gangrene: Secondary | ICD-10-CM | POA: Diagnosis not present

## 2020-04-12 DIAGNOSIS — R059 Cough, unspecified: Secondary | ICD-10-CM | POA: Diagnosis not present

## 2020-04-12 DIAGNOSIS — R0689 Other abnormalities of breathing: Secondary | ICD-10-CM | POA: Diagnosis not present

## 2020-04-12 DIAGNOSIS — Z79899 Other long term (current) drug therapy: Secondary | ICD-10-CM | POA: Diagnosis not present

## 2020-04-12 DIAGNOSIS — R109 Unspecified abdominal pain: Secondary | ICD-10-CM | POA: Diagnosis not present

## 2020-04-12 DIAGNOSIS — G309 Alzheimer's disease, unspecified: Secondary | ICD-10-CM | POA: Diagnosis not present

## 2020-04-12 DIAGNOSIS — J189 Pneumonia, unspecified organism: Secondary | ICD-10-CM | POA: Diagnosis not present

## 2020-04-12 DIAGNOSIS — I700 Atherosclerosis of aorta: Secondary | ICD-10-CM | POA: Diagnosis not present

## 2020-04-12 DIAGNOSIS — Z20822 Contact with and (suspected) exposure to covid-19: Secondary | ICD-10-CM | POA: Diagnosis not present

## 2020-04-17 DIAGNOSIS — I10 Essential (primary) hypertension: Secondary | ICD-10-CM | POA: Diagnosis not present

## 2020-04-17 DIAGNOSIS — Z299 Encounter for prophylactic measures, unspecified: Secondary | ICD-10-CM | POA: Diagnosis not present

## 2020-04-17 DIAGNOSIS — L4050 Arthropathic psoriasis, unspecified: Secondary | ICD-10-CM | POA: Diagnosis not present

## 2020-04-17 DIAGNOSIS — J189 Pneumonia, unspecified organism: Secondary | ICD-10-CM | POA: Diagnosis not present

## 2020-04-17 DIAGNOSIS — F0390 Unspecified dementia without behavioral disturbance: Secondary | ICD-10-CM | POA: Diagnosis not present

## 2020-04-18 DIAGNOSIS — I10 Essential (primary) hypertension: Secondary | ICD-10-CM | POA: Diagnosis not present

## 2020-04-18 DIAGNOSIS — E7800 Pure hypercholesterolemia, unspecified: Secondary | ICD-10-CM | POA: Diagnosis not present

## 2020-04-18 DIAGNOSIS — E119 Type 2 diabetes mellitus without complications: Secondary | ICD-10-CM | POA: Diagnosis not present

## 2020-04-28 DIAGNOSIS — Z79899 Other long term (current) drug therapy: Secondary | ICD-10-CM | POA: Diagnosis not present

## 2020-04-28 DIAGNOSIS — L408 Other psoriasis: Secondary | ICD-10-CM | POA: Diagnosis not present

## 2020-04-28 DIAGNOSIS — L409 Psoriasis, unspecified: Secondary | ICD-10-CM | POA: Diagnosis not present

## 2020-04-29 ENCOUNTER — Ambulatory Visit: Payer: Medicare Other | Admitting: Pulmonary Disease

## 2020-04-29 ENCOUNTER — Encounter: Payer: Self-pay | Admitting: Pulmonary Disease

## 2020-04-29 ENCOUNTER — Other Ambulatory Visit: Payer: Self-pay

## 2020-04-29 VITALS — BP 122/64 | HR 72 | Temp 98.0°F | Ht 60.0 in | Wt 120.1 lb

## 2020-04-29 DIAGNOSIS — I2510 Atherosclerotic heart disease of native coronary artery without angina pectoris: Secondary | ICD-10-CM

## 2020-04-29 DIAGNOSIS — G4734 Idiopathic sleep related nonobstructive alveolar hypoventilation: Secondary | ICD-10-CM | POA: Diagnosis not present

## 2020-04-29 NOTE — Progress Notes (Signed)
[facility]  Chief Complaint  Patient presents with  . Consult    sleep consult    Constitutional:  BP 122/64 (BP Location: Left Arm, Cuff Size: Normal)   Pulse 72   Temp 98 F (36.7 C) (Other (Comment)) Comment (Src): wrist  Ht 5' (1.524 m)   Wt 120 lb 1.6 oz (54.5 kg)   SpO2 96% Comment: Room air  BMI 23.46 kg/m   Past Medical History:  HTN, HLD, Takotsubo CM, Dementia, pre DM, CAD, DJD, Depression, Psoriatic arthritis  Past Surgical History:  Her  has a past surgical history that includes Appendectomy; Cholecystectomy; left heart catheterization with coronary angiogram (N/A, 12/18/2012); and Back surgery (2014).  Brief Summary:  Jennifer [name] is a 79 y.o. female with second hand tobacco exposure and dementia for assessment of daytime sleepiness.      Subjective:   She is here with her brother.  He provided medical history during this interview.  She is able to say few word simple sentences only.  She is followed by Dr. Everlena Cooper for dementia.  She had several episodes of suddenly falling asleep.  There was a question about whether she could have sleep disordered breathing or narcolepsy.  She goes to bed at 9 pm.  She falls asleep in few minutes.  She will snore some.  She sleeps through until 12 pm.  After that she can fall asleep when sitting quiet.  She was tried on provigil, but this didn't make any difference.  She needs assistance with feeding and bathing.  Her husband was a heavy smoker.  She had trouble asthma, but was never formally tested for COPD.  Her brother is concerned that she has been more depressed as the effects of dementia have progressed.  Physical Exam:   Appearance - well kempt   ENMT - no sinus tenderness, no oral exudate, no LAN, Mallampati 2 airway, no stridor  Respiratory - decreased breath sounds bilaterally, no wheezing or rales  CV - s1s2 regular rate and rhythm, no murmurs  Ext - no  clubbing, no edema  Skin - no rashes  Psych - flat affect   Sleep Tests:    Cardiac Tests:   Echo 08/23/19 >> EF 60 to 65%, grade 1 DD  Social History:  She  reports that she is a non-smoker but has been exposed to tobacco smoke. She has never used smokeless tobacco. She reports that she does not drink alcohol and does not use drugs.  Family History:  Her family history includes Breast cancer in her mother; CAD in her father and mother; Cancer in her maternal grandmother; Colon polyps in her brother; Hypertension in her brother and mother.    Discussion:  She had a few episodes of sudden onset of sleep.  She has somewhat advanced dementia and depression.  It is possible she could have sleep disordered breathing.  However, after discussion with her brother, it would be unlikely that she would be able to tolerate any of the therapies for sleep apnea (CPAP, oral appliance, Inspire device) and he would not be in favor of any types of surgical interventions.  She does have second hand tobacco exposure and reports history of asthma.  It is possible she could have some degree of obstructive lung disease that could contribute to nocturnal hypoxemia.  I don't think she would be able to cooperate with pulmonary function test to assess this further, but her brother feels she would be  able to tolerate doing an overnight oximetry at home and then supplemental oxygen at night via nasal cannula if needed.  I explained that it would be very unusual for her to suddenly develop narcolepsy at this age, and do not think there is utility in trying to pursue this diagnosis since this would entail several in lab sleep studies that her brother doesn't feel she would be able to tolerate.  Assessment/Plan:   Nocturnal hypoxia. - will arrange for overnight oximetry on room air and then determine if she is a candidate for supplemental oxygen at night  Dementia with depression. - f/u with PCP and Dr. Everlena Cooper with  neurology  Stress induced cardiomyopathy. - followed by Dr. Diona Browner with [facility]  Psoriatic arthritis. - followed by Dr. Gibson Ramp with dermatology at [facility]  Time Spent Involved in Patient Care on Day of Examination:  36 minutes  Follow up:  Patient Instructions  Will arrange for overnight oxygen test and call with results   Medication List:   Allergies as of 04/29/2020      Reactions   Cyclobenzaprine    TOO STRONG  CAN'T USE AND WORK      Medication List       Accurate as of April 29, 2020 11:50 AM. If you have any questions, ask your nurse or doctor.        STOP taking these medications   bisoprolol 5 MG tablet Commonly known as: ZEBETA Stopped by: Coralyn Helling, MD   modafinil 100 MG tablet Commonly known as: PROVIGIL Stopped by: Coralyn Helling, MD     TAKE these medications   amLODipine 5 MG tablet Commonly known as: NORVASC Take 1 tablet (5 mg total) by mouth daily.   atorvastatin 10 MG tablet Commonly known as: LIPITOR TAKE 1 TABLET AT 6:00 P.M. What changed: See the new instructions.   Biotin 5000 MCG Caps Take 5,000 mcg by mouth every evening.   calcium carbonate 1500 (600 Ca) MG Tabs tablet Commonly known as: OSCAL Take 600 mg of elemental calcium by mouth 2 (two) times daily with a meal.   clobetasol cream 0.05 % Commonly known as: TEMOVATE Apply 1 application topically 2 (two) times daily as needed (for eczema).   fish oil-omega-3 fatty acids 1000 MG capsule Take 1 g by mouth every evening.   hydroxypropyl methylcellulose / hypromellose 2.5 % ophthalmic solution Commonly known as: ISOPTO TEARS / GONIOVISC Place 1 drop into the left eye daily as needed for dry eyes.   memantine 28 MG Cp24 24 hr capsule Commonly known as: NAMENDA XR Take 1 capsule (28 mg total) by mouth daily. What changed: when to take this   PARoxetine 40 MG tablet Commonly known as: PAXIL Take 40 mg by mouth every evening.   Taltz 80 MG/ML Sosy Generic  drug: Ixekizumab Inject 1 Syringe into the skin every 28 (twenty-eight) days.       Signature:  Coralyn Helling, MD [facility] Pulmonary/Critical Care Pager - [phone] 04/29/2020, 11:50 AM

## 2020-04-29 NOTE — Patient Instructions (Signed)
Will arrange for overnight oxygen test and call with results

## 2020-05-13 ENCOUNTER — Encounter: Payer: Self-pay | Admitting: Pulmonary Disease

## 2020-05-13 DIAGNOSIS — R0683 Snoring: Secondary | ICD-10-CM | POA: Diagnosis not present

## 2020-05-13 DIAGNOSIS — G4730 Sleep apnea, unspecified: Secondary | ICD-10-CM | POA: Diagnosis not present

## 2020-05-20 ENCOUNTER — Telehealth: Payer: Self-pay | Admitting: Pulmonary Disease

## 2020-05-20 DIAGNOSIS — E119 Type 2 diabetes mellitus without complications: Secondary | ICD-10-CM | POA: Diagnosis not present

## 2020-05-20 DIAGNOSIS — Z23 Encounter for immunization: Secondary | ICD-10-CM | POA: Diagnosis not present

## 2020-05-20 DIAGNOSIS — G4734 Idiopathic sleep related nonobstructive alveolar hypoventilation: Secondary | ICD-10-CM

## 2020-05-20 DIAGNOSIS — E7800 Pure hypercholesterolemia, unspecified: Secondary | ICD-10-CM | POA: Diagnosis not present

## 2020-05-20 DIAGNOSIS — I10 Essential (primary) hypertension: Secondary | ICD-10-CM | POA: Diagnosis not present

## 2020-05-20 NOTE — Telephone Encounter (Signed)
ONO with RA 05/13/20 >> test time 4 hrs 16 min.  Baseline SpO2 89%, low SpO2 81%.  Spent 2 hrs 12 with SpO2 < 88%.  Please let her know her oxygen level is low at night.  Please arrange for nocturnal home oxygen set up at 2 liters to be used while sleeping.

## 2020-05-23 NOTE — Telephone Encounter (Signed)
Called and went over ONO results per Dr Craige Cotta with patient's brother, Fredrik Cove per [facility]. All questions answered and Fredrik Cove agreeable to order being placed for O2 @ 2L to be used while patient is sleeping. Roger expressed full understanding of this. Order placed per Dr Craige Cotta. Nothing further needed at this time.

## 2020-06-04 NOTE — Progress Notes (Signed)
NEUROLOGY FOLLOW UP OFFICE NOTE  Jennifer [name] [id number]   Subjective:  Jennifer [name] is a 79 year old right-handed woman with hypertension,Takotsubo cardiomyopathyand hyperlipidemia who follows up for dementia.  She is accompanied by her brother who provides history as patient is poor historian.  UPDATE: As per spells were felt to be behavioral and not seizure, Keppra was discontinued in June.  I also wanted her to evaluated by sleep medicine.  She was found to have nocturnal hypoxemia and was ordered nocturnal home oxygen to use while sleeping.  However, she hasn't been able to tolerate it.  Some decreased appetite.  Sedentary.  She needs assistance with all ADLs.  No recent episodes of unresponsiveness.  May have episodes of agitation at night.  Uses Buspar as needed.    HISTORY: She began having noticeable memory problems in late 2014 after some medical issues, such as kidney stones, MI and back surgery. She began having problems remembering how to get to familiar places, such as the dentist or friend's house. She also reports difficulty remembering names of people she has known for years. She denies difficulty recognizing faces. She denies hallucinations, poor sleep or depression. She has not exhibited episodes of agitation or acute confusion. Her brother lives with her. Her son lives next door. Her brother now handles her bills and finances. She no longer drives. She performs all of her ADLs and keeps the house tidy. She exhibits no change in behavior or personality.  In May 2016, she had an episode of unresponsiveness lasting several minutes. She was with her son when suddenly her head dropped with eyes still open but zoned out. She was unresponsive and did not talk. She did not exhibit any twitching or incontinence. She had an MRI of the brain performed on 11/13/14 which showed age appropriate atrophy and mild chronic small vessel disease. Carotid doppler performed  on 11/25/14 showed less than 50% bilateral ICA stenosis. She has had two other similar spells only lasting about 30 seconds. EEG performed on 01/15/15, which was normal. She had another spell in on 08/24/16. She presented to the [facility] ED for further evaluation. MRI of brain revealed mild chronic small vessel disease but no acute stroke or mass lesion. Aricept was discontinued because it may lower seizure threshold.  She was admitted to [facility] on 08/22/2019 after found on the floor passed out.She was arguing with her brother. She got on the floor and crawled to the corner. Her brother left to get her son. When they returned after a couple of minutes, she was unresponsive on the floor. Her son thought she wasn't breathing and proceeded with CPR but she woke up after a minute, however I suspect that she was not in respiratory arrest. She was awake when EMS arrived. She did not bite her tongue or have incontinence.CT head was unremarkable. Cardiac enzymes (tropinin) were negative. Echocardiogram showed EF 60-65% with grade 1 diastolic dysfunction.EEG showed diffuse slowing but no evidence of seizure. She was found to have a UTI and started on antibiotics.  Subsequent outpatient Zio patch cardiac monitoring was negative for arrhythmia.    She presented to the [facility] ED on 09/21/2019 for episode of unresponsiveness, in which she had her eyes closed and would grunt but not verbally respond.  Blood pressure was 128/50s.  When EMS arrived and stuck her finger for blood sugar, she began talking.  Event lasted about 30 to 40 minutes.  Keppra was increased to 1000mg  twice daily and  she was discharged.  The next morning, she was sitting in a chair when she suddenly closed her eyes and became unresponsiveness.  No jerking activity.    She was admitted to [facility] where she was started on Provigil 100mg  in the AM for hypersomnia.  It has not been effective.  Her PCP suggested  increasing dose to 200mg .  She presented to [facility] ED again on 11/29/2019 for two more episodes of unresponsiveness.   She didn't respond to Narcan but apparently woke up after given flumazenil, however she had not been taking any benzodiazepine.   Ultimately, I felt that her spells were behavioral and not seizure, so I discontinued Keppra in June 2021.   She has history of OSA but unable to tolerate CPAP.  Both parents died in their 88s and had no history of dementia.  PAST MEDICAL HISTORY: Past Medical History:  Diagnosis Date  . Alzheimer's dementia [facility])    Dr 70s in [place]  . Anxiety   . Borderline diabetes   . Coronary atherosclerosis of native coronary artery    Nonobstructive at catheterization June 2014  . DDD (degenerative disc disease), lumbar   . Depression   . Essential hypertension   . Hyperlipidemia   . Nephrolithiasis   . Psoriatic arthritis (HCC)   . Takotsubo cardiomyopathy    LVEF 60-65% September 2014    MEDICATIONS: Current Outpatient Medications on File Prior to Visit  Medication Sig Dispense Refill  . amLODipine (NORVASC) 5 MG tablet Take 1 tablet (5 mg total) by mouth daily. 30 tablet 6  . atorvastatin (LIPITOR) 10 MG tablet TAKE 1 TABLET AT 6:00 P.M. (Patient taking differently: Take 10 mg by mouth every evening. ) 30 tablet 3  . Biotin 5000 MCG CAPS Take 5,000 mcg by mouth every evening.     . calcium carbonate (OSCAL) 1500 (600 Ca) MG TABS tablet Take 600 mg of elemental calcium by mouth 2 (two) times daily with a meal.    . clobetasol cream (TEMOVATE) 0.05 % Apply 1 application topically 2 (two) times daily as needed (for eczema).     . fish oil-omega-3 fatty acids 1000 MG capsule Take 1 g by mouth every evening.     . hydroxypropyl methylcellulose / hypromellose (ISOPTO TEARS / GONIOVISC) 2.5 % ophthalmic solution Place 1 drop into the left eye daily as needed for dry eyes.     . Ixekizumab (TALTZ) 80 MG/ML SOSY Inject 1 Syringe into  the skin every 28 (twenty-eight) days.     . memantine (NAMENDA XR) 28 MG CP24 24 hr capsule Take 1 capsule (28 mg total) by mouth daily. (Patient taking differently: Take 28 mg by mouth every evening. ) 30 capsule 5  . PARoxetine (PAXIL) 40 MG tablet Take 40 mg by mouth every evening.      No current facility-administered medications on file prior to visit.    ALLERGIES: Allergies  Allergen Reactions  . Cyclobenzaprine     TOO STRONG  CAN'T USE AND WORK    FAMILY HISTORY: Family History  Problem Relation Age of Onset  . CAD Father   . CAD Mother   . Hypertension Mother   . Breast cancer Mother   . Hypertension Brother         X 2  . Colon polyps Brother        thinks he is on 10 year surveillance program   . Cancer Maternal Grandmother        breast  cancer   . Colon cancer Neg Hx     SOCIAL HISTORY: Social History   Socioeconomic History  . Marital status: Married    Spouse name: Not on file  . Number of children: 2  . Years of education: Not on file  . Highest education level: High school graduate  Occupational History  . Not on file  Tobacco Use  . Smoking status: Passive Smoke Exposure - Never Smoker  . Smokeless tobacco: Never Used  Vaping Use  . Vaping Use: Never used  Substance and Sexual Activity  . Alcohol use: No    Alcohol/week: 0.0 standard drinks  . Drug use: No  . Sexual activity: Not Currently  Other Topics Concern  . Not on file  Social History Narrative  . Not on file   Social Determinants of Health   Financial Resource Strain: Not on file  Food Insecurity: Not on file  Transportation Needs: Not on file  Physical Activity: Not on file  Stress: Not on file  Social Connections: Not on file  Intimate Partner Violence: Not on file     Objective:  Blood pressure 135/74, pulse 78, height 5' (1.524 m), weight 117 lb (53.1 kg), SpO2 92 %. General: No acute distress.  Patient appears well-groomed.   Head:  Normocephalic/atraumatic Eyes:   Fundi examined but not visualized Neck: supple, no paraspinal tenderness, full range of motion Heart:  Regular rate and rhythm Lungs:  Clear to auscultation bilaterally Back: No paraspinal tenderness Neurological Exam:  Unable to complete MMSE.  Unable to correctly state season, year, date. Recalled 0 of 3 words.  Unable to spell WORLD backwards.    Assessment/Plan:   1.  Major neurocognitive disorder secondary to Alzheimer's dementia 2.  Recurrent episodes of unresponsiveness, likely behavioral.  Seizure not suspected.  1.  Namenda XR 28mg  daily 2.  Try to use home O2 while sleeping as per sleep medicine 3.  24 hour supervision 4.  Follow up in 9 months  , DO  CC: Shon Millet, MD

## 2020-06-05 ENCOUNTER — Encounter: Payer: Self-pay | Admitting: Neurology

## 2020-06-05 ENCOUNTER — Ambulatory Visit (INDEPENDENT_AMBULATORY_CARE_PROVIDER_SITE_OTHER): Payer: Medicare Other | Admitting: Neurology

## 2020-06-05 ENCOUNTER — Other Ambulatory Visit: Payer: Self-pay

## 2020-06-05 VITALS — BP 135/74 | HR 78 | Ht 60.0 in | Wt 117.0 lb

## 2020-06-05 DIAGNOSIS — G301 Alzheimer's disease with late onset: Secondary | ICD-10-CM | POA: Diagnosis not present

## 2020-06-05 DIAGNOSIS — F0280 Dementia in other diseases classified elsewhere without behavioral disturbance: Secondary | ICD-10-CM | POA: Diagnosis not present

## 2020-06-05 DIAGNOSIS — I2510 Atherosclerotic heart disease of native coronary artery without angina pectoris: Secondary | ICD-10-CM | POA: Diagnosis not present

## 2020-06-05 NOTE — Patient Instructions (Signed)
Continue memantine Follow up in 9 months

## 2020-06-19 DIAGNOSIS — E119 Type 2 diabetes mellitus without complications: Secondary | ICD-10-CM | POA: Diagnosis not present

## 2020-06-19 DIAGNOSIS — E7800 Pure hypercholesterolemia, unspecified: Secondary | ICD-10-CM | POA: Diagnosis not present

## 2020-06-19 DIAGNOSIS — I10 Essential (primary) hypertension: Secondary | ICD-10-CM | POA: Diagnosis not present

## 2020-06-27 DIAGNOSIS — Z6824 Body mass index (BMI) 24.0-24.9, adult: Secondary | ICD-10-CM | POA: Diagnosis not present

## 2020-06-27 DIAGNOSIS — R809 Proteinuria, unspecified: Secondary | ICD-10-CM | POA: Diagnosis not present

## 2020-06-27 DIAGNOSIS — E1129 Type 2 diabetes mellitus with other diabetic kidney complication: Secondary | ICD-10-CM | POA: Diagnosis not present

## 2020-06-27 DIAGNOSIS — Z299 Encounter for prophylactic measures, unspecified: Secondary | ICD-10-CM | POA: Diagnosis not present

## 2020-06-27 DIAGNOSIS — R569 Unspecified convulsions: Secondary | ICD-10-CM | POA: Diagnosis not present

## 2020-06-27 DIAGNOSIS — I10 Essential (primary) hypertension: Secondary | ICD-10-CM | POA: Diagnosis not present

## 2020-06-27 DIAGNOSIS — Z789 Other specified health status: Secondary | ICD-10-CM | POA: Diagnosis not present

## 2020-06-27 DIAGNOSIS — E1165 Type 2 diabetes mellitus with hyperglycemia: Secondary | ICD-10-CM | POA: Diagnosis not present

## 2020-06-29 NOTE — Progress Notes (Signed)
Cardiology Office Note  Date: 06/30/2020   ID: Jennifer [name], DOB Mar 08, 1941, MRN [id number]  PCP:  Kirstie Peri, MD  Cardiologist:  Nona Dell, MD Electrophysiologist:  None   Chief Complaint: Cardiac follow up  History of Present Illness: Jennifer [name] is a 80 y.o. female with a history of CAD, borderline diabetes, Alzheimer's dementia, anxiety, HTN, HLD, psoriatic arthritis, history of Takotsubo's cardiomyopathy.  Last encounter with Dr. Diona Browner 12/20/2019.  She had not had any evidence of symptomatic arrhythmias.  She was currently taking bisoprolol, Lipitor, and Norvasc.  She had lost some weight.  On her last echocardiogram in March 2021 EF was 60 to 65% with mild diastolic dysfunction.  Dr. Diona Browner was reluctant to start her on diuretic.  Therapy due to recent weight loss and evidence of intravascular volume contraction by recent lab work.  She was to continue Norvasc and bisoprolol.  History of dementia and question of narcolepsy/OSA.  She was scheduled for sleep testing per neurology.   She is here for follow-up today.  Denies any recent issues.  Her relative who is with her states she was recently changed to a new medication for her psoriatic arthritis, Tremfya.  She denies any recent anginal or exertional symptoms, palpitations or arrhythmias, orthostatic symptoms, CVA or TIA-like symptoms, PND, orthopnea.  Recently saw Dr. Everlena Cooper who took her off of Keppra.  She has had no seizure activity or unresponsive episodes as she was having in the past.  There was question as to whether she was having narcolepsy or there was a combination of OSA related symptoms contributing to excessive sleepiness.  Relative states it was likely a combination of medications causing these issues.   Past Medical History:  Diagnosis Date   Alzheimer's dementia [facility])    Dr Adriana Mccallum in [place]   Anxiety    Borderline diabetes    Coronary atherosclerosis of native coronary artery     Nonobstructive at catheterization June 2014   DDD (degenerative disc disease), lumbar    Depression    Essential hypertension    Hyperlipidemia    Nephrolithiasis    Psoriatic arthritis (HCC)    Takotsubo cardiomyopathy    LVEF 60-65% September 2014    Past Surgical History:  Procedure Laterality Date   APPENDECTOMY     BACK SURGERY  2014   CHOLECYSTECTOMY     LEFT HEART CATHETERIZATION WITH CORONARY ANGIOGRAM N/A 12/18/2012   Procedure: LEFT HEART CATHETERIZATION WITH CORONARY ANGIOGRAM;  Surgeon: Tonny Bollman, MD;  Location: [facility] CATH LAB;  Service: Cardiovascular;  Laterality: N/A;    Current Outpatient Medications  Medication Sig Dispense Refill   amLODipine (NORVASC) 5 MG tablet Take 1 tablet (5 mg total) by mouth daily. 30 tablet 6   atorvastatin (LIPITOR) 10 MG tablet TAKE 1 TABLET AT 6:00 P.M. (Patient taking differently: Take 10 mg by mouth every evening.) 30 tablet 3   Biotin 5000 MCG CAPS Take 5,000 mcg by mouth every evening.      calcium carbonate (OSCAL) 1500 (600 Ca) MG TABS tablet Take 600 mg of elemental calcium by mouth 2 (two) times daily with a meal.     clobetasol cream (TEMOVATE) 0.05 % Apply 1 application topically 2 (two) times daily as needed (for eczema).      fish oil-omega-3 fatty acids 1000 MG capsule Take 1 g by mouth every evening.      Guselkumab (TREMFYA) 100 MG/ML SOPN Inject into the skin. Every 56 days     hydroxypropyl  methylcellulose / hypromellose (ISOPTO TEARS / GONIOVISC) 2.5 % ophthalmic solution Place 1 drop into the left eye daily as needed for dry eyes.      memantine (NAMENDA XR) 28 MG CP24 24 hr capsule Take 1 capsule (28 mg total) by mouth daily. (Patient taking differently: Take 28 mg by mouth every evening.) 30 capsule 5   PARoxetine (PAXIL) 40 MG tablet Take 40 mg by mouth every evening.      No current facility-administered medications for this visit.   Allergies:  Cyclobenzaprine   Social History: The  patient  reports that she is a non-smoker but has been exposed to tobacco smoke. She has never used smokeless tobacco. She reports that she does not drink alcohol and does not use drugs.   Family History: The patient's family history includes Breast cancer in her mother; CAD in her father and mother; Cancer in her maternal grandmother; Colon polyps in her brother; Hypertension in her brother and mother.   ROS:  Please see the history of present illness. Otherwise, complete review of systems is positive for none.  All other systems are reviewed and negative.   Physical Exam: VS:  BP 128/70    Pulse 61    Ht 5' (1.524 m)    Wt 118 lb (53.5 kg)    SpO2 92%    BMI 23.05 kg/m , BMI Body mass index is 23.05 kg/m.  Wt Readings from Last 3 Encounters:  06/30/20 118 lb (53.5 kg)  06/05/20 117 lb (53.1 kg)  04/29/20 120 lb 1.6 oz (54.5 kg)    General: Patient appears comfortable at rest. Neck: Supple, no elevated JVP or carotid bruits, no thyromegaly. Lungs: Clear to auscultation, nonlabored breathing at rest. Cardiac: Regular rate and rhythm, no S3 or significant systolic murmur, no pericardial rub. Extremities: No pitting edema, distal pulses 2+. Skin: Warm and dry. Musculoskeletal: No kyphosis. Neuropsychiatric: Alert and oriented x3, affect grossly appropriate.  ECG:  EKG 11/29/2019 sinus rhythm with a rate of 71, LAFB, LVH, possible anterior infarct age-indeterminate.  Recent Labwork: 08/23/2019: TSH 2.385 11/29/2019: ALT 37; AST 34; B Natriuretic Peptide 165.0; BUN 17; Creatinine, Ser 0.69; Hemoglobin 15.7; Platelets 166; Potassium 4.2; Sodium 141     Component Value Date/Time   CHOL 152 12/16/2012 0520   TRIG 34 12/16/2012 0520   HDL 57 12/16/2012 0520   CHOLHDL 2.7 12/16/2012 0520   VLDL 7 12/16/2012 0520   LDLCALC 88 12/16/2012 0520    Other Studies Reviewed Today:  Cardiac monitor 08/23/2019 Study Highlights ZIO AT reviewed.  8 days 4 hours analyzed.  Predominant rhythm is  sinus.  Heart rate ranged from 48 bpm up to 88 bpm with average heart rate 59 bpm.  Only rare PACs and PVCs were noted representing less than 1% of total beats.  There were no sustained arrhythmias or pauses.    Echocardiogram 08/23/2019 IMPRESSIONS  1. Left ventricular ejection fraction, by estimation, is 60 to 65%. The  left ventricle has normal function. The left ventricle has no regional  wall motion abnormalities. Left ventricular diastolic parameters are  consistent with Grade I diastolic  dysfunction (impaired relaxation).  2. Right ventricular systolic function is normal. The right ventricular  size is normal.  3. The mitral valve is normal in structure and function. No evidence of  mitral valve regurgitation. No evidence of mitral stenosis.  4. The aortic valve is tricuspid. Aortic valve regurgitation is not  visualized. No aortic stenosis is present.  5. The inferior  vena cava IVC is small, suggesting low RA pressur  Assessment and Plan:  1. History of cardiomyopathy   2. Essential hypertension   3. Alzheimer's dementia without behavioral disturbance, unspecified timing of dementia onset (HCC)     1. History of cardiomyopathy Most recent echocardiogram March 2021 showed EF of 60 to 65% G1 DD.  2. Essential hypertension Blood pressure is well controlled today at 128/70.  Continue amlodipine 5 mg daily.  3. Alzheimer's dementia without behavioral disturbance, unspecified timing of dementia onset [facility]) Patient is alert responding appropriately to questions today.  Her relative states she is continuing with Namenda and was taken off of Keppra by her neurologist.  He states he occasionally administers BuSpar.  4.  Mixed hyperlipidemia Lipid panel 12/06/2019 4 total cholesterol 144, triglycerides 70, HDL 66, LDL 64.  Continue atorvastatin 10 mg daily.    Medication Adjustments/Labs and Tests Ordered: Current medicines are reviewed at length with the patient today.   Concerns regarding medicines are outlined above.   Disposition: Follow-up with Dr. Diona Browner or APP 6 months Signed, Rennis Harding, NP 06/30/2020 10:05 AM    [facility] at [facility] [address] [place], [place], [place] [address] Phone: [phone]; Fax: [phone]

## 2020-06-30 ENCOUNTER — Ambulatory Visit (INDEPENDENT_AMBULATORY_CARE_PROVIDER_SITE_OTHER): Payer: Medicare Other | Admitting: Family Medicine

## 2020-06-30 ENCOUNTER — Encounter: Payer: Self-pay | Admitting: Family Medicine

## 2020-06-30 VITALS — BP 128/70 | HR 61 | Ht 60.0 in | Wt 118.0 lb

## 2020-06-30 DIAGNOSIS — Z8679 Personal history of other diseases of the circulatory system: Secondary | ICD-10-CM

## 2020-06-30 DIAGNOSIS — F0280 Dementia in other diseases classified elsewhere without behavioral disturbance: Secondary | ICD-10-CM

## 2020-06-30 DIAGNOSIS — I10 Essential (primary) hypertension: Secondary | ICD-10-CM

## 2020-06-30 DIAGNOSIS — G309 Alzheimer's disease, unspecified: Secondary | ICD-10-CM

## 2020-06-30 NOTE — Patient Instructions (Signed)
Medication Instructions:  Continue all current medications.   Labwork: none  Testing/Procedures: none  Follow-Up: 6 months   Any Other Special Instructions Will Be Listed Below (If Applicable).   If you need a refill on your cardiac medications before your next appointment, please call your pharmacy.  

## 2020-09-30 DIAGNOSIS — I739 Peripheral vascular disease, unspecified: Secondary | ICD-10-CM | POA: Diagnosis not present

## 2020-09-30 DIAGNOSIS — F0390 Unspecified dementia without behavioral disturbance: Secondary | ICD-10-CM | POA: Diagnosis not present

## 2020-09-30 DIAGNOSIS — I10 Essential (primary) hypertension: Secondary | ICD-10-CM | POA: Diagnosis not present

## 2020-09-30 DIAGNOSIS — Z299 Encounter for prophylactic measures, unspecified: Secondary | ICD-10-CM | POA: Diagnosis not present

## 2020-09-30 DIAGNOSIS — L4050 Arthropathic psoriasis, unspecified: Secondary | ICD-10-CM | POA: Diagnosis not present

## 2020-09-30 DIAGNOSIS — E1165 Type 2 diabetes mellitus with hyperglycemia: Secondary | ICD-10-CM | POA: Diagnosis not present

## 2020-11-19 DIAGNOSIS — Z23 Encounter for immunization: Secondary | ICD-10-CM | POA: Diagnosis not present

## 2020-12-04 DIAGNOSIS — Z0000 Encounter for general adult medical examination without abnormal findings: Secondary | ICD-10-CM | POA: Diagnosis not present

## 2020-12-04 DIAGNOSIS — E559 Vitamin D deficiency, unspecified: Secondary | ICD-10-CM | POA: Diagnosis not present

## 2020-12-04 DIAGNOSIS — E7800 Pure hypercholesterolemia, unspecified: Secondary | ICD-10-CM | POA: Diagnosis not present

## 2020-12-04 DIAGNOSIS — R5383 Other fatigue: Secondary | ICD-10-CM | POA: Diagnosis not present

## 2020-12-04 DIAGNOSIS — Z299 Encounter for prophylactic measures, unspecified: Secondary | ICD-10-CM | POA: Diagnosis not present

## 2020-12-04 DIAGNOSIS — I10 Essential (primary) hypertension: Secondary | ICD-10-CM | POA: Diagnosis not present

## 2020-12-04 DIAGNOSIS — Z1339 Encounter for screening examination for other mental health and behavioral disorders: Secondary | ICD-10-CM | POA: Diagnosis not present

## 2020-12-04 DIAGNOSIS — Z7189 Other specified counseling: Secondary | ICD-10-CM | POA: Diagnosis not present

## 2020-12-04 DIAGNOSIS — Z789 Other specified health status: Secondary | ICD-10-CM | POA: Diagnosis not present

## 2020-12-04 DIAGNOSIS — Z6824 Body mass index (BMI) 24.0-24.9, adult: Secondary | ICD-10-CM | POA: Diagnosis not present

## 2020-12-04 DIAGNOSIS — Z79899 Other long term (current) drug therapy: Secondary | ICD-10-CM | POA: Diagnosis not present

## 2020-12-04 DIAGNOSIS — Z1331 Encounter for screening for depression: Secondary | ICD-10-CM | POA: Diagnosis not present

## 2020-12-23 DIAGNOSIS — Z20822 Contact with and (suspected) exposure to covid-19: Secondary | ICD-10-CM | POA: Diagnosis not present

## 2020-12-29 ENCOUNTER — Other Ambulatory Visit: Payer: Self-pay

## 2020-12-29 ENCOUNTER — Ambulatory Visit (INDEPENDENT_AMBULATORY_CARE_PROVIDER_SITE_OTHER): Payer: Medicare Other | Admitting: Cardiology

## 2020-12-29 ENCOUNTER — Encounter: Payer: Self-pay | Admitting: Cardiology

## 2020-12-29 VITALS — BP 116/68 | HR 68 | Ht 60.0 in | Wt 122.0 lb

## 2020-12-29 DIAGNOSIS — Z8679 Personal history of other diseases of the circulatory system: Secondary | ICD-10-CM

## 2020-12-29 DIAGNOSIS — I10 Essential (primary) hypertension: Secondary | ICD-10-CM

## 2020-12-29 NOTE — Progress Notes (Signed)
Cardiology Office Note  Date: 12/29/2020   ID: Jennifer [name], DOB [birth year], MRN [id number]  PCP:  Kirstie Peri, MD  Cardiologist:  Nona Dell, MD Electrophysiologist:  None   Chief Complaint  Patient presents with   Cardiac follow-up    History of Present Illness: Jennifer [name] is an 80 y.o. female last seen in January by Mr. Vincenza Hews NP.  She is here with her brother for a follow-up visit.  Overall, no major change in status, no exertional chest pain or progressive shortness of breath.  She does have intermittent, mild dependent edema.  I reviewed her recent lab work from [facility].  I personally reviewed her ECG today which shows sinus rhythm with left anterior fascicular block and LVH, decreased R wave progression.  I reviewed her medications which are noted below.  No significant change in cardiac regimen.  Past Medical History:  Diagnosis Date   Alzheimer's dementia [facility])    Dr Adriana Mccallum in [place]   Anxiety    Borderline diabetes    Coronary atherosclerosis of native coronary artery    Nonobstructive at catheterization June 2014   DDD (degenerative disc disease), lumbar    Depression    Essential hypertension    Hyperlipidemia    Nephrolithiasis    Psoriatic arthritis (HCC)    Takotsubo cardiomyopathy    LVEF 60-65% September 2014    Past Surgical History:  Procedure Laterality Date   APPENDECTOMY     BACK SURGERY  2014   CHOLECYSTECTOMY     LEFT HEART CATHETERIZATION WITH CORONARY ANGIOGRAM N/A 12/18/2012   Procedure: LEFT HEART CATHETERIZATION WITH CORONARY ANGIOGRAM;  Surgeon: Tonny Bollman, MD;  Location: [facility] CATH LAB;  Service: Cardiovascular;  Laterality: N/A;    Current Outpatient Medications  Medication Sig Dispense Refill   amLODipine (NORVASC) 5 MG tablet Take 1 tablet (5 mg total) by mouth daily. 30 tablet 6   atorvastatin (LIPITOR) 10 MG tablet TAKE 1 TABLET AT 6:00 P.M. (Patient taking differently: Take 10 mg by mouth  every evening.) 30 tablet 3   Biotin 5000 MCG CAPS Take 5,000 mcg by mouth every evening.      busPIRone (BUSPAR) 10 MG tablet Take 10 mg by mouth 2 (two) times daily.     calcium carbonate (OSCAL) 1500 (600 Ca) MG TABS tablet Take 600 mg of elemental calcium by mouth 2 (two) times daily with a meal.     clobetasol cream (TEMOVATE) 0.05 % Apply 1 application topically 2 (two) times daily as needed (for eczema).      fish oil-omega-3 fatty acids 1000 MG capsule Take 1 g by mouth every evening.      Guselkumab (TREMFYA) 100 MG/ML SOPN Inject into the skin. Every 56 days     hydroxypropyl methylcellulose / hypromellose (ISOPTO TEARS / GONIOVISC) 2.5 % ophthalmic solution Place 1 drop into the left eye daily as needed for dry eyes.      memantine (NAMENDA XR) 28 MG CP24 24 hr capsule Take 1 capsule (28 mg total) by mouth daily. (Patient taking differently: Take 28 mg by mouth every evening.) 30 capsule 5   PARoxetine (PAXIL) 40 MG tablet Take 40 mg by mouth every evening.      No current facility-administered medications for this visit.   Allergies:  Cyclobenzaprine   ROS: No syncope.  Physical Exam: VS:  BP 116/68   Pulse 68   Ht 5' (1.524 m)   Wt 122 lb (55.3 kg)  SpO2 96%   BMI 23.83 kg/m , BMI Body mass index is 23.83 kg/m.  Wt Readings from Last 3 Encounters:  12/29/20 122 lb (55.3 kg)  06/30/20 118 lb (53.5 kg)  06/05/20 117 lb (53.1 kg)    General: Elderly woman, appears comfortable at rest. HEENT: Conjunctiva and lids normal, wearing a mask. Neck: Supple, no elevated JVP or carotid bruits, no thyromegaly. Lungs: Clear to auscultation, nonlabored breathing at rest. Cardiac: Regular rate and rhythm, no S3 or significant systolic murmur, no pericardial rub. Extremities: No pitting edema.  ECG:  An ECG dated 11/29/2019 was personally reviewed today and demonstrated:  Sinus rhythm with LVH, left anterior fascicular block, decreased R wave progression, repolarization  abnormalities.  Recent Labwork:  June 2022: Hemoglobin 16.1, platelets 191, TSH 4.01, HDL 60, LDL 69, BUN 15, creatinine 0.59, potassium 3.9, AST 20, ALT 25  Other Studies Reviewed Today:  Echocardiogram 08/23/2019:  1. Left ventricular ejection fraction, by estimation, is 60 to 65%. The  left ventricle has normal function. The left ventricle has no regional  wall motion abnormalities. Left ventricular diastolic parameters are  consistent with Grade I diastolic  dysfunction (impaired relaxation).   2. Right ventricular systolic function is normal. The right ventricular  size is normal.   3. The mitral valve is normal in structure and function. No evidence of  mitral valve regurgitation. No evidence of mitral stenosis.   4. The aortic valve is tricuspid. Aortic valve regurgitation is not  visualized. No aortic stenosis is present.   5. The inferior vena cava IVC is small, suggesting low RA pressure.   Assessment and Plan:  1.  History of stress-induced cardiomyopathy with normalization of LVEF, 60 to 65% by last assessment.  2.  Essential hypertension, blood pressure low normal today.  Continue Norvasc.  She is no longer on bisoprolol.  Medication Adjustments/Labs and Tests Ordered: Current medicines are reviewed at length with the patient today.  Concerns regarding medicines are outlined above.   Tests Ordered: Orders Placed This Encounter  Procedures   EKG 12-Lead    Medication Changes: No orders of the defined types were placed in this encounter.   Disposition:  Follow up  6 months.  Signed, Jonelle Sidle, MD, [facility] 12/29/2020 2:22 PM    [facility] [address] [place], [place], [place] [address] Phone: [phone]; Fax: [phone]

## 2020-12-29 NOTE — Patient Instructions (Signed)

## 2021-01-06 DIAGNOSIS — I10 Essential (primary) hypertension: Secondary | ICD-10-CM | POA: Diagnosis not present

## 2021-01-06 DIAGNOSIS — Z299 Encounter for prophylactic measures, unspecified: Secondary | ICD-10-CM | POA: Diagnosis not present

## 2021-01-06 DIAGNOSIS — Z713 Dietary counseling and surveillance: Secondary | ICD-10-CM | POA: Diagnosis not present

## 2021-01-06 DIAGNOSIS — Z6824 Body mass index (BMI) 24.0-24.9, adult: Secondary | ICD-10-CM | POA: Diagnosis not present

## 2021-01-06 DIAGNOSIS — R030 Elevated blood-pressure reading, without diagnosis of hypertension: Secondary | ICD-10-CM | POA: Diagnosis not present

## 2021-01-06 DIAGNOSIS — E1165 Type 2 diabetes mellitus with hyperglycemia: Secondary | ICD-10-CM | POA: Diagnosis not present

## 2021-03-05 NOTE — Progress Notes (Signed)
NEUROLOGY FOLLOW UP OFFICE NOTE  Jennifer [name] [id number]  Assessment/Plan:   Major neurocognitive disorder secondary to Alzheimer's disease Recurrent episodes of unresponsiveness, likely behavioral - seizures not suspected  - Namenda XR 28 daily - Buspirone as needed for agitation.  - 24 hour supervision - Follow up one year  Subjective:  Jennifer [name] is a 80 year old right-handed woman with hypertension, Takotsubo cardiomyopathy and hyperlipidemia who follows up for dementia.  She is accompanied by her brother who provides history as patient is poor historian.   UPDATE: Current medication:  Namenda XR 28mg  daily  She lives at home with 24 hour supervision (her brother).  Some decreased appetite.  Sedentary.  She needs assistance with all ADLs.  No recent episodes of unresponsiveness.  May have episodes of agitation at night.  Uses Buspar as needed.     HISTORY: She began having noticeable memory problems in late 2014 after some medical issues, such as kidney stones, MI and back surgery.  She began having problems remembering how to get to familiar places, such as the dentist or friend's house.  She also reports difficulty remembering names of people she has known for years.  She denies difficulty recognizing faces.  She denies hallucinations, poor sleep or depression.  She has not exhibited episodes of agitation or acute confusion.  Her brother lives with her.  Her son lives next door.  Her brother now handles her bills and finances.  She no longer drives. She performs all of her ADLs and keeps the house tidy.  She exhibits no change in behavior or personality.   In May 2016, she had an episode of unresponsiveness lasting several minutes.  She was with her son when suddenly her head dropped with eyes still open but zoned out.  She was unresponsive and did not talk.  She did not exhibit any twitching or incontinence.  She had an MRI of the brain performed on 11/13/14 which showed  age appropriate atrophy and mild chronic small vessel disease.  Carotid doppler performed on 11/25/14 showed less than 50% bilateral ICA stenosis.  She has had two other similar spells only lasting about 30 seconds.  EEG performed on 01/15/15, which was normal.  She had another spell in on 08/24/16.  She presented to the [facility] ED for further evaluation.  MRI of brain revealed mild chronic small vessel disease but no acute stroke or mass lesion.  Aricept was discontinued because it may lower seizure threshold.   She was admitted to [facility] on 08/22/2019 after found on the floor passed out.  She was arguing with her brother.  She got on the floor and crawled to the corner.  Her brother left to get her son.  When they returned after a couple of minutes, she was unresponsive on the floor. Her son thought she wasn't breathing and proceeded with CPR but she woke up after a minute, however I suspect that she was not in respiratory arrest.  She was awake when EMS arrived.  She did not bite her tongue or have incontinence.  CT head was unremarkable.  Cardiac enzymes (tropinin) were negative.  Echocardiogram showed EF 60-65% with grade 1 diastolic dysfunction.   EEG showed diffuse slowing but no evidence of seizure.  She was found to have a UTI and started on antibiotics.  Subsequent outpatient Zio patch cardiac monitoring was negative for arrhythmia.     She presented to the [facility] ED on 09/21/2019 for episode of unresponsiveness,  in which she had her eyes closed and would grunt but not verbally respond.  Blood pressure was 128/50s.  When EMS arrived and stuck her finger for blood sugar, she began talking.  Event lasted about 30 to 40 minutes.  Keppra was increased to 1000mg  twice daily and she was discharged.  The next morning, she was sitting in a chair when she suddenly closed her eyes and became unresponsiveness.  No jerking activity.    She was admitted to [facility] where she was started on  Provigil 100mg  in the AM for hypersomnia.  It has not been effective.  Her PCP suggested increasing dose to 200mg .   She presented to [facility] ED again on 11/29/2019 for two more episodes of unresponsiveness.   She didn't respond to Narcan but apparently woke up after given flumazenil, however she had not been taking any benzodiazepine.    Ultimately, I felt that her spells were behavioral and not seizure, so I discontinued Keppra in June 2021.     She has history of OSA/nocturnal hypoxemia but unable to tolerate CPAP or home O2.   Both parents died in their 7s and had no history of dementia.  PAST MEDICAL HISTORY: Past Medical History:  Diagnosis Date   Alzheimer's dementia (HCC)    Dr 01/29/2020 in [place]   Anxiety    Borderline diabetes    Coronary atherosclerosis of native coronary artery    Nonobstructive at catheterization June 2014   DDD (degenerative disc disease), lumbar    Depression    Essential hypertension    Hyperlipidemia    Nephrolithiasis    Psoriatic arthritis (HCC)    Takotsubo cardiomyopathy    LVEF 60-65% September 2014    MEDICATIONS: Current Outpatient Medications on File Prior to Visit  Medication Sig Dispense Refill   amLODipine (NORVASC) 5 MG tablet Take 1 tablet (5 mg total) by mouth daily. 30 tablet 6   atorvastatin (LIPITOR) 10 MG tablet TAKE 1 TABLET AT 6:00 P.M. (Patient taking differently: Take 10 mg by mouth every evening.) 30 tablet 3   Biotin 5000 MCG CAPS Take 5,000 mcg by mouth every evening.      busPIRone (BUSPAR) 10 MG tablet Take 10 mg by mouth 2 (two) times daily.     calcium carbonate (OSCAL) 1500 (600 Ca) MG TABS tablet Take 600 mg of elemental calcium by mouth 2 (two) times daily with a meal.     clobetasol cream (TEMOVATE) 0.05 % Apply 1 application topically 2 (two) times daily as needed (for eczema).      fish oil-omega-3 fatty acids 1000 MG capsule Take 1 g by mouth every evening.      Guselkumab (TREMFYA) 100 MG/ML SOPN  Inject into the skin. Every 56 days     hydroxypropyl methylcellulose / hypromellose (ISOPTO TEARS / GONIOVISC) 2.5 % ophthalmic solution Place 1 drop into the left eye daily as needed for dry eyes.      memantine (NAMENDA XR) 28 MG CP24 24 hr capsule Take 1 capsule (28 mg total) by mouth daily. (Patient taking differently: Take 28 mg by mouth every evening.) 30 capsule 5   PARoxetine (PAXIL) 40 MG tablet Take 40 mg by mouth every evening.      No current facility-administered medications on file prior to visit.    ALLERGIES: Allergies  Allergen Reactions   Cyclobenzaprine     TOO STRONG  CAN'T USE AND WORK    FAMILY HISTORY: Family History  Problem Relation Age  of Onset   CAD Father    CAD Mother    Hypertension Mother    Breast cancer Mother    Hypertension Brother         X 2   Colon polyps Brother        thinks he is on 10 year surveillance program    Cancer Maternal Grandmother        breast cancer    Colon cancer Neg Hx       Objective:  Blood pressure 135/75, pulse 66, height 5' (1.524 m), weight 128 lb 12.8 oz (58.4 kg), SpO2 93 %. General: No acute distress.  Patient appears well-groomed.   Head:  Normocephalic/atraumatic Eyes:  Fundi examined but not visualized Neck: supple, no paraspinal tenderness, full range of motion Heart:  Regular rate and rhythm Lungs:  Clear to auscultation bilaterally Back: No paraspinal tenderness Neurological Exam: alert and oriented to self.  CN II-XII grossly intact.  Bulk and tone normal, muscle strength 5/5 upper extremities, 5-/5 lower extremities  Deep tendon reflexes 2+ throughout.  Finger to nose testing intact.  In wheelchair.  Unsteady gait.    Shon Millet, DO  CC: Kirstie Peri, MD

## 2021-03-09 ENCOUNTER — Ambulatory Visit (INDEPENDENT_AMBULATORY_CARE_PROVIDER_SITE_OTHER): Payer: Medicare Other | Admitting: Neurology

## 2021-03-09 ENCOUNTER — Encounter: Payer: Self-pay | Admitting: Neurology

## 2021-03-09 ENCOUNTER — Other Ambulatory Visit: Payer: Self-pay

## 2021-03-09 VITALS — BP 135/75 | HR 66 | Ht 60.0 in | Wt 128.8 lb

## 2021-03-09 DIAGNOSIS — G301 Alzheimer's disease with late onset: Secondary | ICD-10-CM | POA: Diagnosis not present

## 2021-03-09 DIAGNOSIS — F0280 Dementia in other diseases classified elsewhere without behavioral disturbance: Secondary | ICD-10-CM

## 2021-03-09 NOTE — Patient Instructions (Signed)
Follow-up in one year.

## 2021-03-10 DIAGNOSIS — Z299 Encounter for prophylactic measures, unspecified: Secondary | ICD-10-CM | POA: Diagnosis not present

## 2021-03-10 DIAGNOSIS — R600 Localized edema: Secondary | ICD-10-CM | POA: Diagnosis not present

## 2021-03-10 DIAGNOSIS — M79606 Pain in leg, unspecified: Secondary | ICD-10-CM | POA: Diagnosis not present

## 2021-03-10 DIAGNOSIS — I739 Peripheral vascular disease, unspecified: Secondary | ICD-10-CM | POA: Diagnosis not present

## 2021-03-10 DIAGNOSIS — M7989 Other specified soft tissue disorders: Secondary | ICD-10-CM | POA: Diagnosis not present

## 2021-03-10 DIAGNOSIS — I10 Essential (primary) hypertension: Secondary | ICD-10-CM | POA: Diagnosis not present

## 2021-03-10 DIAGNOSIS — M79604 Pain in right leg: Secondary | ICD-10-CM | POA: Diagnosis not present

## 2021-03-10 DIAGNOSIS — M79661 Pain in right lower leg: Secondary | ICD-10-CM | POA: Diagnosis not present

## 2021-03-26 IMAGING — CT CT HEAD W/O CM
3 series · 16 of 46 positions shown, 19 images · non-contrast
Comparison: 08/22/2019

CLINICAL DATA: Altered mental status

EXAM:
CT HEAD WITHOUT CONTRAST
TECHNIQUE: Contiguous axial images were obtained from the base of the skull
through the vertex without intravenous contrast.

[Series 2: head w o · axial · 0.40mm/px · z∈[-98,+22]mm · 10 of 29 slices shown, 13 images]
[im 3/29  brain]
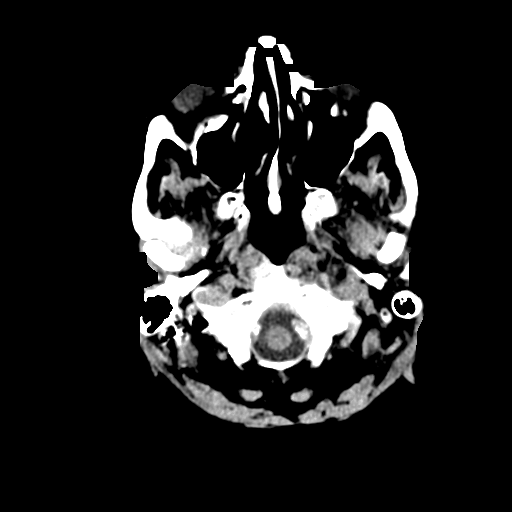
[im 3/29  bone]
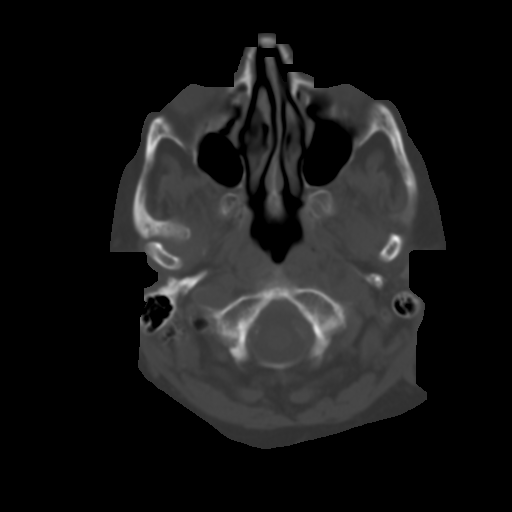
[im 6/29  brain]
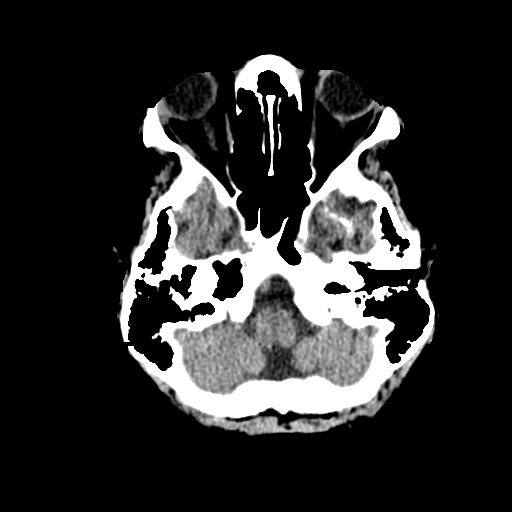
[im 8/29  brain]
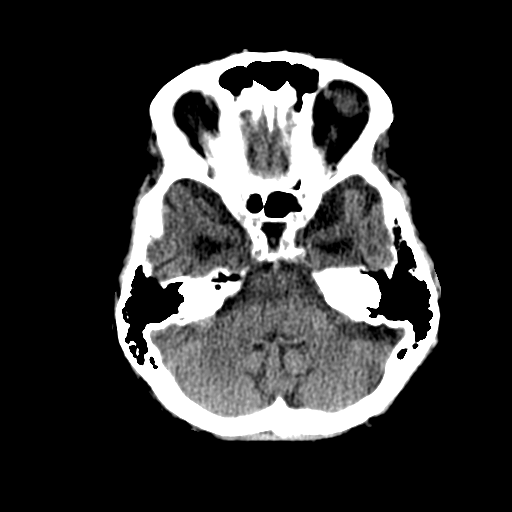
[im 11/29  brain]
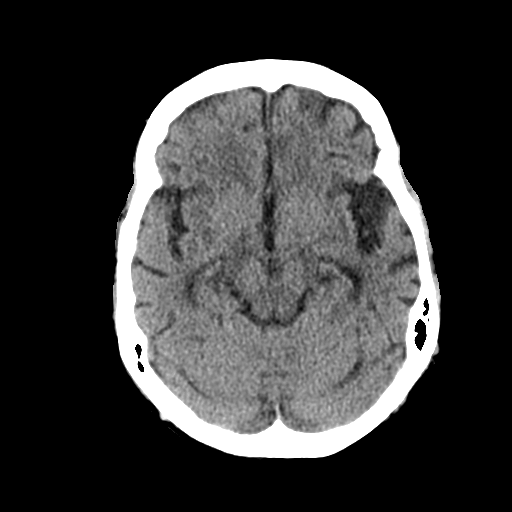
[im 14/29  brain]
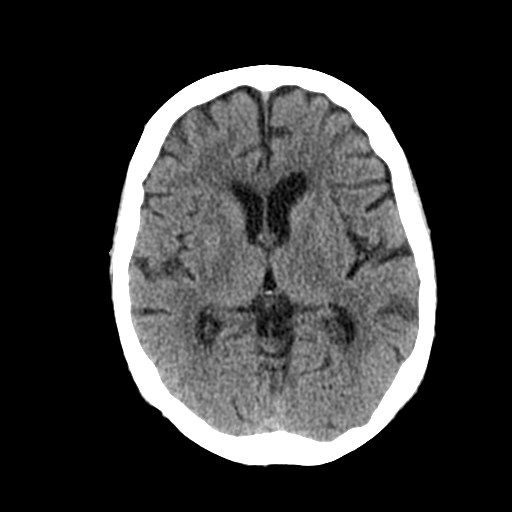
[im 14/29  bone]
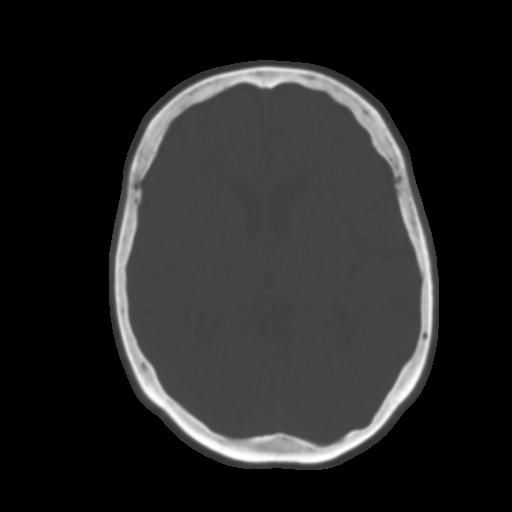
[im 16/29  brain]
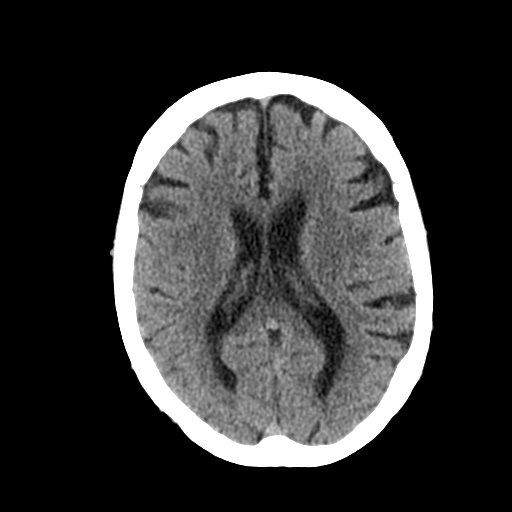
[im 19/29  brain]
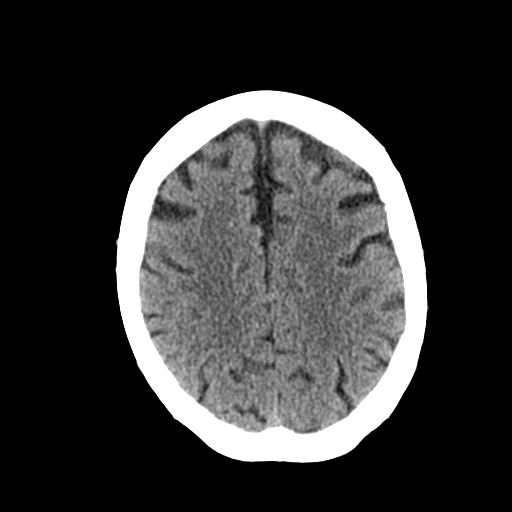
[im 22/29  brain]
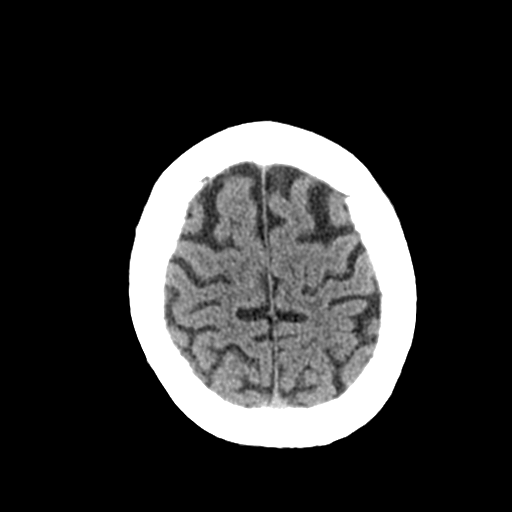
[im 24/29  brain]
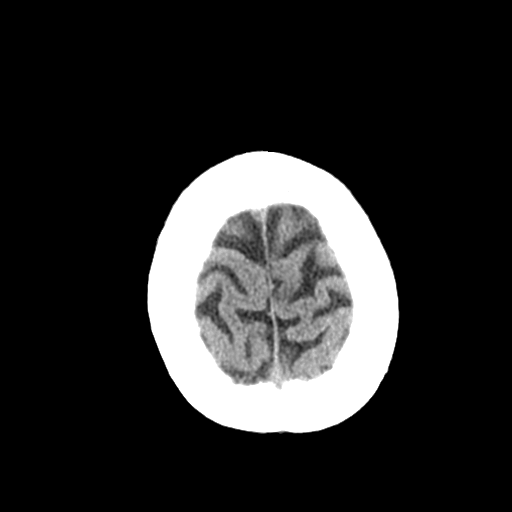
[im 24/29  bone]
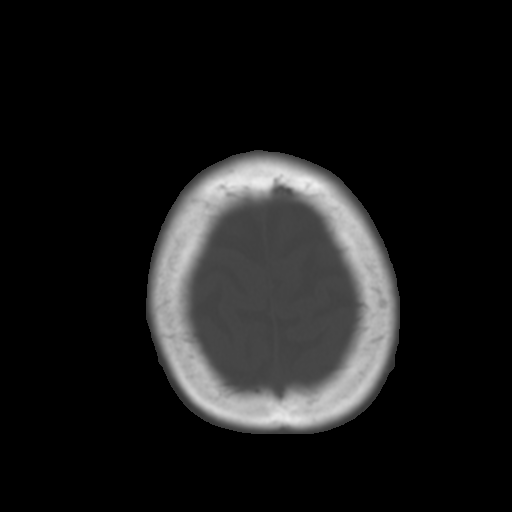
[im 27/29  brain]
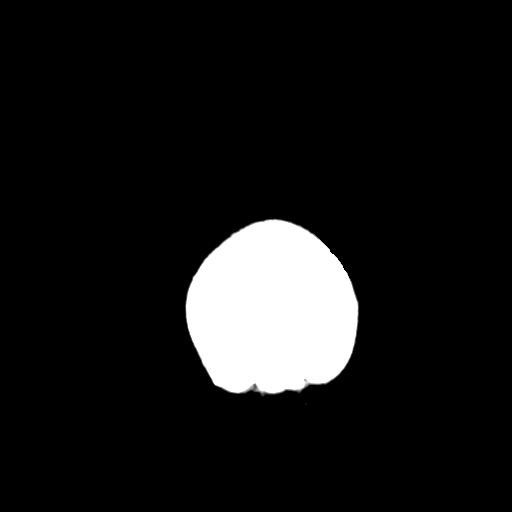

[Series 4: coronal soft · coronal · 0.33mm/px · 3 of 67 slices shown]
[im 23/67  brain]
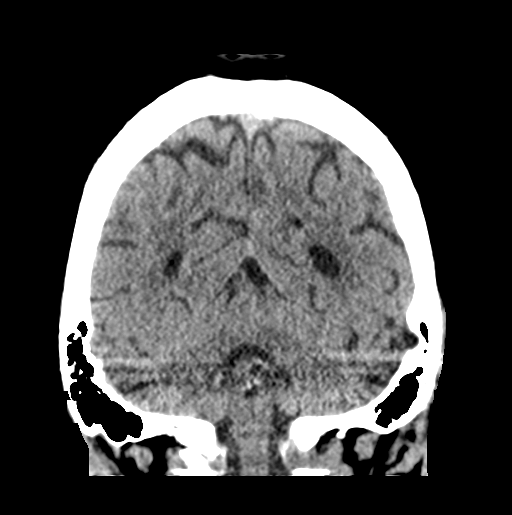
[im 30/67  brain]
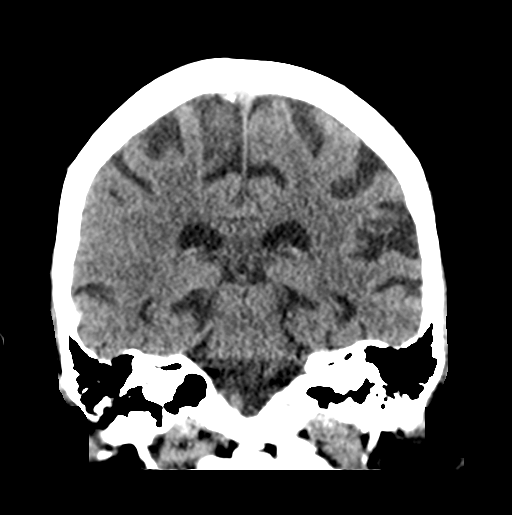
[im 37/67  brain]
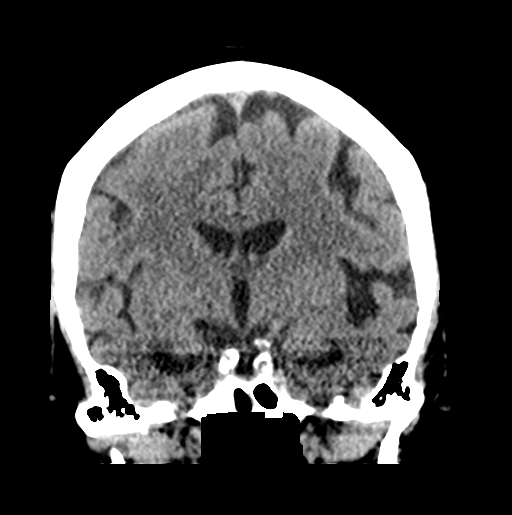

[Series 5: sagittal soft · sagittal · 0.32mm/px · 3 of 55 slices shown]
[im 19/55  brain]
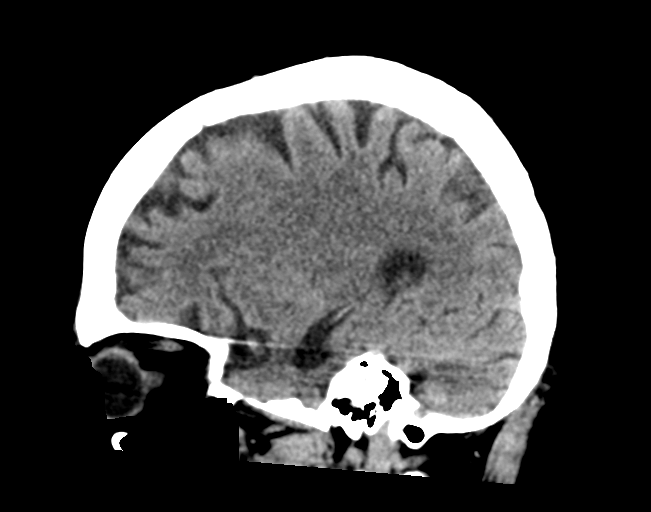
[im 28/55  brain]
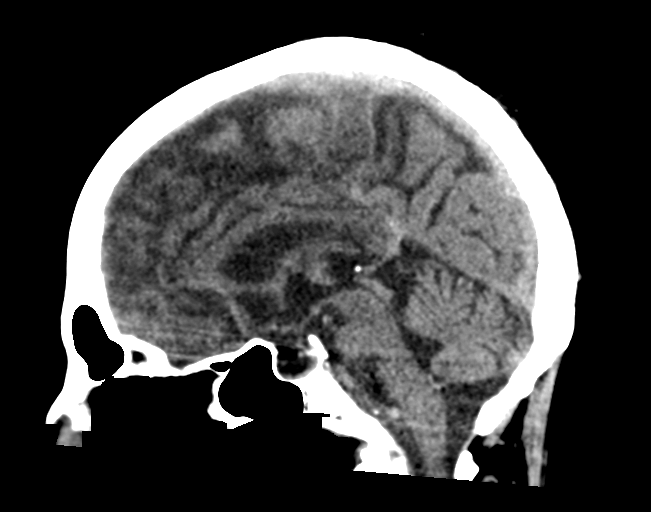
[im 37/55  brain]
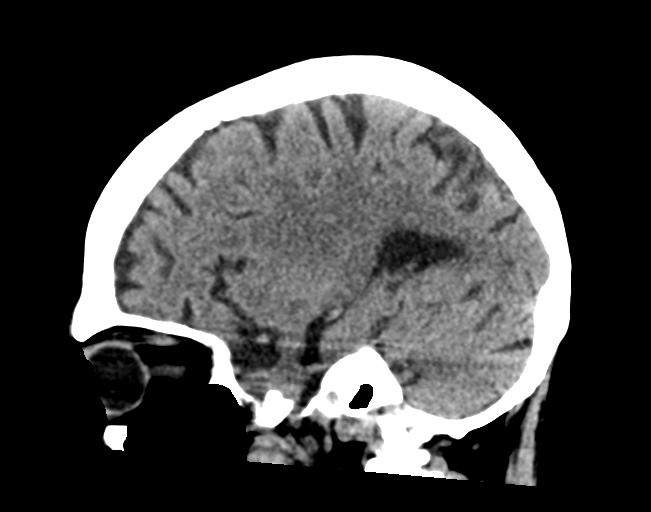

[16 of 46 positions shown; findings below may reference images not displayed]

FINDINGS: Brain: No evidence of acute infarction, hemorrhage, hydrocephalus,
extra-axial collection or mass lesion/mass effect. Mild atrophic
changes and chronic white matter ischemic changes are seen.

Vascular: No hyperdense vessel or unexpected calcification.

Skull: Normal. Negative for fracture or focal lesion.

Sinuses/Orbits: No acute finding.

Other: None.
IMPRESSION: No acute intracranial abnormality noted.

## 2021-04-10 DIAGNOSIS — Z6824 Body mass index (BMI) 24.0-24.9, adult: Secondary | ICD-10-CM | POA: Diagnosis not present

## 2021-04-10 DIAGNOSIS — I10 Essential (primary) hypertension: Secondary | ICD-10-CM | POA: Diagnosis not present

## 2021-04-10 DIAGNOSIS — I739 Peripheral vascular disease, unspecified: Secondary | ICD-10-CM | POA: Diagnosis not present

## 2021-04-10 DIAGNOSIS — Z299 Encounter for prophylactic measures, unspecified: Secondary | ICD-10-CM | POA: Diagnosis not present

## 2021-04-10 DIAGNOSIS — E1165 Type 2 diabetes mellitus with hyperglycemia: Secondary | ICD-10-CM | POA: Diagnosis not present

## 2021-04-16 DIAGNOSIS — F0280 Dementia in other diseases classified elsewhere without behavioral disturbance: Secondary | ICD-10-CM | POA: Diagnosis not present

## 2021-04-16 DIAGNOSIS — G309 Alzheimer's disease, unspecified: Secondary | ICD-10-CM | POA: Diagnosis not present

## 2021-04-16 DIAGNOSIS — Z79899 Other long term (current) drug therapy: Secondary | ICD-10-CM | POA: Diagnosis not present

## 2021-04-16 DIAGNOSIS — E876 Hypokalemia: Secondary | ICD-10-CM | POA: Diagnosis not present

## 2021-04-16 DIAGNOSIS — R531 Weakness: Secondary | ICD-10-CM | POA: Diagnosis not present

## 2021-04-16 DIAGNOSIS — E785 Hyperlipidemia, unspecified: Secondary | ICD-10-CM | POA: Diagnosis not present

## 2021-04-16 DIAGNOSIS — I10 Essential (primary) hypertension: Secondary | ICD-10-CM | POA: Diagnosis not present

## 2021-04-16 DIAGNOSIS — N3000 Acute cystitis without hematuria: Secondary | ICD-10-CM | POA: Diagnosis not present

## 2021-04-16 DIAGNOSIS — R4182 Altered mental status, unspecified: Secondary | ICD-10-CM | POA: Diagnosis not present

## 2021-04-16 DIAGNOSIS — F32A Depression, unspecified: Secondary | ICD-10-CM | POA: Diagnosis not present

## 2021-04-16 DIAGNOSIS — R0789 Other chest pain: Secondary | ICD-10-CM | POA: Diagnosis not present

## 2021-04-16 DIAGNOSIS — L409 Psoriasis, unspecified: Secondary | ICD-10-CM | POA: Diagnosis not present

## 2021-04-16 DIAGNOSIS — R4020 Unspecified coma: Secondary | ICD-10-CM | POA: Diagnosis not present

## 2021-04-17 DIAGNOSIS — R4182 Altered mental status, unspecified: Secondary | ICD-10-CM | POA: Diagnosis not present

## 2021-04-21 DIAGNOSIS — E7800 Pure hypercholesterolemia, unspecified: Secondary | ICD-10-CM | POA: Diagnosis not present

## 2021-04-21 DIAGNOSIS — F0390 Unspecified dementia without behavioral disturbance: Secondary | ICD-10-CM | POA: Diagnosis not present

## 2021-04-21 DIAGNOSIS — Z299 Encounter for prophylactic measures, unspecified: Secondary | ICD-10-CM | POA: Diagnosis not present

## 2021-04-21 DIAGNOSIS — I10 Essential (primary) hypertension: Secondary | ICD-10-CM | POA: Diagnosis not present

## 2021-04-21 DIAGNOSIS — N390 Urinary tract infection, site not specified: Secondary | ICD-10-CM | POA: Diagnosis not present

## 2021-05-11 DIAGNOSIS — Z20828 Contact with and (suspected) exposure to other viral communicable diseases: Secondary | ICD-10-CM | POA: Diagnosis not present

## 2021-05-11 DIAGNOSIS — L111 Transient acantholytic dermatosis [Grover]: Secondary | ICD-10-CM | POA: Diagnosis not present

## 2021-05-11 DIAGNOSIS — Z5181 Encounter for therapeutic drug level monitoring: Secondary | ICD-10-CM | POA: Diagnosis not present

## 2021-05-11 DIAGNOSIS — Z23 Encounter for immunization: Secondary | ICD-10-CM | POA: Diagnosis not present

## 2021-05-11 DIAGNOSIS — L539 Erythematous condition, unspecified: Secondary | ICD-10-CM | POA: Diagnosis not present

## 2021-05-11 DIAGNOSIS — L409 Psoriasis, unspecified: Secondary | ICD-10-CM | POA: Diagnosis not present

## 2021-05-11 DIAGNOSIS — Z79899 Other long term (current) drug therapy: Secondary | ICD-10-CM | POA: Diagnosis not present

## 2021-05-26 DIAGNOSIS — Z20822 Contact with and (suspected) exposure to covid-19: Secondary | ICD-10-CM | POA: Diagnosis not present

## 2021-06-03 DIAGNOSIS — Z299 Encounter for prophylactic measures, unspecified: Secondary | ICD-10-CM | POA: Diagnosis not present

## 2021-06-03 DIAGNOSIS — I10 Essential (primary) hypertension: Secondary | ICD-10-CM | POA: Diagnosis not present

## 2021-06-03 DIAGNOSIS — E1165 Type 2 diabetes mellitus with hyperglycemia: Secondary | ICD-10-CM | POA: Diagnosis not present

## 2021-06-03 DIAGNOSIS — Z6824 Body mass index (BMI) 24.0-24.9, adult: Secondary | ICD-10-CM | POA: Diagnosis not present

## 2021-06-16 ENCOUNTER — Telehealth: Payer: Self-pay | Admitting: Cardiology

## 2021-06-16 DIAGNOSIS — I10 Essential (primary) hypertension: Secondary | ICD-10-CM

## 2021-06-16 NOTE — Telephone Encounter (Signed)
Pt c/o medication issue:  1. Name of Medication: amLODipine (NORVASC) 5 MG tablet  2. How are you currently taking this medication (dosage and times per day)? Patient started taking 10 mg as directed by her PCP on 06/03/21  3. Are you having a reaction (difficulty breathing--STAT)?   4. What is your medication issue? Patient's brother is concerned that the patient's BP is still high    Pt c/o BP issue: STAT if pt c/o blurred vision, one-sided weakness or slurred speech  1. What are your last 5 BP readings?  06/15/21: 139/76 HR 75 06/14/21: 154/82 HR 69 06/13/21: 153/85 HR 74 06/12/21: 147/80 HR 67 06/11/21: 153/84 HR 77 06/10/21: 143/73 HR 67 06/09/21: 137/76 HR 72 06/08/21: 139/81 HR 77 06/07/21: 128/72 HR 72  2. Are you having any other symptoms (ex. Dizziness, headache, blurred vision, passed out)?   3. What is your BP issue? Patient's brother wanted to get input from the Cardiologist as to what to do about her elevated BP.

## 2021-06-17 DIAGNOSIS — I10 Essential (primary) hypertension: Secondary | ICD-10-CM | POA: Diagnosis not present

## 2021-06-17 DIAGNOSIS — F32A Depression, unspecified: Secondary | ICD-10-CM | POA: Diagnosis not present

## 2021-06-17 DIAGNOSIS — R569 Unspecified convulsions: Secondary | ICD-10-CM | POA: Diagnosis not present

## 2021-06-17 DIAGNOSIS — Z6824 Body mass index (BMI) 24.0-24.9, adult: Secondary | ICD-10-CM | POA: Diagnosis not present

## 2021-06-17 DIAGNOSIS — Z299 Encounter for prophylactic measures, unspecified: Secondary | ICD-10-CM | POA: Diagnosis not present

## 2021-06-17 NOTE — Telephone Encounter (Signed)
Left message to return call 

## 2021-06-18 NOTE — Telephone Encounter (Signed)
Returned call to pt's brother - states that he did get call from pcp & they wanted her to begin Losartan 25mg  daily based off of her continued elevated bp readings.  He has not done this yet because he wanted our input.  Discussed with him the option of adding the Aldactone & prefers to get your advice as well.    OV scheduled for f/u as she was due for her 6 mo anyway - Dr. for 07/20/2021 in [place] office.

## 2021-06-18 NOTE — Telephone Encounter (Signed)
Follow Up:      Patient's brother was calling back from yesterday. He did not have the name of who it was that called.

## 2021-06-18 NOTE — Telephone Encounter (Signed)
Patient's brother was returning phone call from yesterday evening

## 2021-06-19 DIAGNOSIS — I10 Essential (primary) hypertension: Secondary | ICD-10-CM | POA: Diagnosis not present

## 2021-06-19 MED ORDER — LOSARTAN POTASSIUM 25 MG PO TABS
12.5000 mg | ORAL_TABLET | Freq: Every day | ORAL | Status: DC
Start: 2021-06-19 — End: 2022-08-27

## 2021-06-19 NOTE — Telephone Encounter (Signed)
Fredrik Cove (brother) notified.  States he will use the Losartan 25mg  prescription that is at [facility] that his pcp sent in - he will take 12.5mg  daily per Dr. [facility] advice.  She will do lab at [facility] around 06/29/2021.

## 2021-06-19 NOTE — Addendum Note (Signed)
Addended by: Lesle Chris on: 06/19/2021 05:15 PM   Modules accepted: Orders

## 2021-07-08 ENCOUNTER — Telehealth: Payer: Self-pay | Admitting: *Deleted

## 2021-07-08 NOTE — Telephone Encounter (Signed)
Patient's brother (Roger Gibson) informed. Copy sent to PCP 

## 2021-07-08 NOTE — Telephone Encounter (Signed)
-----   Message from Satira Sark, MD sent at 07/01/2021  1:55 PM EST ----- Results reviewed.  Follow-up lab work shows normal potassium and renal function after start of losartan.

## 2021-07-14 DIAGNOSIS — Z23 Encounter for immunization: Secondary | ICD-10-CM | POA: Diagnosis not present

## 2021-07-18 NOTE — Progress Notes (Signed)
Cardiology Office Note:    Date:  07/20/2021   ID:  Jennifer [name], DOB 1941-01-14, MRN [id number]  PCP:  Monico Blitz, MD   [facility] Providers Cardiologist:  Rozann Lesches, MD {   Referring MD: Monico Blitz, MD    History of Present Illness:    Jennifer [name] is a 81 y.o. female with a hx of CAD, DMII, Alzheimer's dementia, HTN, HLD, history of takotsubo's CM, and psoriatic arthritis who is followed by Dr. Domenic Polite who now presents to clinic for follow-up.  Last saw Dr. Domenic Polite on 12/2020 where she was having intermittent edema. Last TTE in 08/2019 with normal EF 60-65%, G1DD, normal RV, no significant valve disease. No changes in meds at that time.  Today, the patient is accompanied by her husband who provides the history. He states that the patient's blood pressure has been elevated at home. Specifically has been running 140-150s. Her amlodipine was recently doubled to 10mg  daily and she was started on losartan 12.5mg  daily with persistently elevated blood pressures 130-140s. No chest pain, SOB, lightheadedness or dizziness. Chronic LE edema and uses compression socks.   Past Medical History:  Diagnosis Date   Alzheimer's dementia [facility])    Dr Loretta Plume in [place] diabetes    Coronary atherosclerosis of native coronary artery    Nonobstructive at catheterization June 2014   DDD (degenerative disc disease), lumbar    Depression    Essential hypertension    Hyperlipidemia    Nephrolithiasis    Psoriatic arthritis ([place])    Takotsubo cardiomyopathy    LVEF 60-65% September 2014    Past Surgical History:  Procedure Laterality Date   APPENDECTOMY     BACK SURGERY  2014   CHOLECYSTECTOMY     LEFT HEART CATHETERIZATION WITH CORONARY ANGIOGRAM N/A 12/18/2012   Procedure: LEFT HEART CATHETERIZATION WITH CORONARY ANGIOGRAM;  Surgeon: Sherren Mocha, MD;  Location: [facility] CATH LAB;  Service: Cardiovascular;  Laterality: N/A;    Current  Medications: Current Meds  Medication Sig   amLODipine (NORVASC) 10 MG tablet Take 10 mg by mouth daily.   atorvastatin (LIPITOR) 10 MG tablet TAKE 1 TABLET AT 6:00 P.M. (Patient taking differently: Take 10 mg by mouth every evening.)   busPIRone (BUSPAR) 10 MG tablet Take 10 mg by mouth 2 (two) times daily.   clobetasol cream (TEMOVATE) AB-123456789 % Apply 1 application topically 2 (two) times daily as needed (for eczema).    Guselkumab (TREMFYA) 100 MG/ML SOPN Inject into the skin. Every 56 days   losartan (COZAAR) 25 MG tablet Take 0.5 tablets (12.5 mg total) by mouth daily.   memantine (NAMENDA XR) 28 MG CP24 24 hr capsule Take 1 capsule (28 mg total) by mouth daily. (Patient taking differently: Take 28 mg by mouth every evening.)   PARoxetine (PAXIL) 40 MG tablet Take 40 mg by mouth every evening.      Allergies:   Cyclobenzaprine   Social History   Socioeconomic History   Marital status: Married    Spouse name: Not on file   Number of children: 2   Years of education: Not on file   Highest education level: High school graduate  Occupational History   Not on file  Tobacco Use   Smoking status: Never    Passive exposure: Yes   Smokeless tobacco: Never  Vaping Use   Vaping Use: Never used  Substance and Sexual Activity   Alcohol use: No    Alcohol/week:  0.0 standard drinks   Drug use: No   Sexual activity: Not Currently  Other Topics Concern   Not on file  Social History Narrative   Right Handed   One Story Home    Patient drinks little caffeine. Mostly drinks flavored water   Social Determinants of Health   Financial Resource Strain: Not on file  Food Insecurity: Not on file  Transportation Needs: Not on file  Physical Activity: Not on file  Stress: Not on file  Social Connections: Not on file     Family History: The patient's family history includes Breast cancer in her mother; CAD in her father and mother; Cancer in her maternal grandmother; Colon polyps in her  brother; Hypertension in her brother and mother. There is no history of Colon cancer.  ROS:   Please see the history of present illness.     All other systems reviewed and are negative.  EKGs/Labs/Other Studies Reviewed:    The following studies were reviewed today: Echocardiogram 08/23/2019:  1. Left ventricular ejection fraction, by estimation, is 60 to 65%. The  left ventricle has normal function. The left ventricle has no regional  wall motion abnormalities. Left ventricular diastolic parameters are  consistent with Grade I diastolic  dysfunction (impaired relaxation).   2. Right ventricular systolic function is normal. The right ventricular  size is normal.   3. The mitral valve is normal in structure and function. No evidence of  mitral valve regurgitation. No evidence of mitral stenosis.   4. The aortic valve is tricuspid. Aortic valve regurgitation is not  visualized. No aortic stenosis is present.   5. The inferior vena cava IVC is small, suggesting low RA pressure.   EKG:  No new ECG today  Recent Labs: No results found for requested labs within last 8760 hours.  Recent Lipid Panel    Component Value Date/Time   CHOL 152 12/16/2012 0520   TRIG 34 12/16/2012 0520   HDL 57 12/16/2012 0520   CHOLHDL 2.7 12/16/2012 0520   VLDL 7 12/16/2012 0520   LDLCALC 88 12/16/2012 0520          Physical Exam:    VS:  BP 130/60    Pulse 67    Ht 5' (1.524 m)    Wt 121 lb 3.2 oz (55 kg)    SpO2 95%    BMI 23.67 kg/m     Wt Readings from Last 3 Encounters:  07/20/21 121 lb 3.2 oz (55 kg)  03/09/21 128 lb 12.8 oz (58.4 kg)  12/29/20 122 lb (55.3 kg)     GEN:  Well nourished, well developed in no acute distress HEENT: Normal NECK: No JVD; No carotid bruits CARDIAC: RRR, no murmurs, rubs, gallops RESPIRATORY:  Clear to auscultation without rales, wheezing or rhonchi  ABDOMEN: Soft, non-tender, non-distended MUSCULOSKELETAL:  No edema; No deformity  SKIN: Warm and  dry NEUROLOGIC:  Calm, able to answer simple questions and follow commands PSYCHIATRIC:  Calm  ASSESSMENT:    1. Essential hypertension   2. History of cardiomyopathy   3. Atherosclerosis of native coronary artery of native heart without angina pectoris    PLAN:    In order of problems listed above:  #HTN: Has been elevated at home mainly running 130-140s since the medication adjustments made over the past couple of months. Will up-titrate losartan today. -Increase losartan to 25mg  daily -Continue amlodipine 10mg  daily -Monitor at home and can up-titrate ARB further if needed  #History of stress-induced CM:  EF 60-65% on most recent TTE in 08/2019, Currently euvolemic on exam. Not on diuretics. -Doing well and compensated on exam  #HLD: -Continue lipitor 10mg  daily  #Dementia: -On memantine       Medication Adjustments/Labs and Tests Ordered: Current medicines are reviewed at length with the patient today.  Concerns regarding medicines are outlined above.  No orders of the defined types were placed in this encounter.  No orders of the defined types were placed in this encounter.   Patient Instructions  Follow-Up: Follow up with Dr. Johney Frame in 6 months  If you need a refill on your cardiac medications before your next appointment, please call your pharmacy.    Signed, Freada Bergeron, MD  07/20/2021 3:07 PM    [facility]

## 2021-07-20 ENCOUNTER — Other Ambulatory Visit: Payer: Self-pay

## 2021-07-20 ENCOUNTER — Encounter: Payer: Self-pay | Admitting: Cardiology

## 2021-07-20 ENCOUNTER — Ambulatory Visit: Payer: Medicare PPO | Admitting: Cardiology

## 2021-07-20 VITALS — BP 130/60 | HR 67 | Ht 60.0 in | Wt 121.2 lb

## 2021-07-20 DIAGNOSIS — I10 Essential (primary) hypertension: Secondary | ICD-10-CM

## 2021-07-20 DIAGNOSIS — I2510 Atherosclerotic heart disease of native coronary artery without angina pectoris: Secondary | ICD-10-CM | POA: Diagnosis not present

## 2021-07-20 DIAGNOSIS — Z8679 Personal history of other diseases of the circulatory system: Secondary | ICD-10-CM

## 2021-07-20 NOTE — Patient Instructions (Signed)
Follow-Up: Follow up with Dr. Shari Prows in 6 months  If you need a refill on your cardiac medications before your next appointment, please call your pharmacy.

## 2021-08-11 DIAGNOSIS — Z6823 Body mass index (BMI) 23.0-23.9, adult: Secondary | ICD-10-CM | POA: Diagnosis not present

## 2021-08-11 DIAGNOSIS — I10 Essential (primary) hypertension: Secondary | ICD-10-CM | POA: Diagnosis not present

## 2021-08-11 DIAGNOSIS — Z299 Encounter for prophylactic measures, unspecified: Secondary | ICD-10-CM | POA: Diagnosis not present

## 2021-08-11 DIAGNOSIS — N390 Urinary tract infection, site not specified: Secondary | ICD-10-CM | POA: Diagnosis not present

## 2021-08-11 DIAGNOSIS — E1129 Type 2 diabetes mellitus with other diabetic kidney complication: Secondary | ICD-10-CM | POA: Diagnosis not present

## 2021-08-11 DIAGNOSIS — E1165 Type 2 diabetes mellitus with hyperglycemia: Secondary | ICD-10-CM | POA: Diagnosis not present

## 2021-08-11 DIAGNOSIS — R809 Proteinuria, unspecified: Secondary | ICD-10-CM | POA: Diagnosis not present

## 2021-08-18 DIAGNOSIS — I10 Essential (primary) hypertension: Secondary | ICD-10-CM | POA: Diagnosis not present

## 2021-09-17 DIAGNOSIS — I10 Essential (primary) hypertension: Secondary | ICD-10-CM | POA: Diagnosis not present

## 2021-10-13 DIAGNOSIS — Z79899 Other long term (current) drug therapy: Secondary | ICD-10-CM | POA: Diagnosis not present

## 2021-10-13 DIAGNOSIS — Z0000 Encounter for general adult medical examination without abnormal findings: Secondary | ICD-10-CM | POA: Diagnosis not present

## 2021-10-13 DIAGNOSIS — R5383 Other fatigue: Secondary | ICD-10-CM | POA: Diagnosis not present

## 2021-10-13 DIAGNOSIS — E7800 Pure hypercholesterolemia, unspecified: Secondary | ICD-10-CM | POA: Diagnosis not present

## 2021-10-13 DIAGNOSIS — Z299 Encounter for prophylactic measures, unspecified: Secondary | ICD-10-CM | POA: Diagnosis not present

## 2021-10-13 DIAGNOSIS — I10 Essential (primary) hypertension: Secondary | ICD-10-CM | POA: Diagnosis not present

## 2021-10-13 DIAGNOSIS — Z6823 Body mass index (BMI) 23.0-23.9, adult: Secondary | ICD-10-CM | POA: Diagnosis not present

## 2021-10-18 DIAGNOSIS — I10 Essential (primary) hypertension: Secondary | ICD-10-CM | POA: Diagnosis not present

## 2021-10-22 DIAGNOSIS — Z20822 Contact with and (suspected) exposure to covid-19: Secondary | ICD-10-CM | POA: Diagnosis not present

## 2021-10-29 DIAGNOSIS — Z20822 Contact with and (suspected) exposure to covid-19: Secondary | ICD-10-CM | POA: Diagnosis not present

## 2021-11-17 DIAGNOSIS — I10 Essential (primary) hypertension: Secondary | ICD-10-CM | POA: Diagnosis not present

## 2021-11-18 DIAGNOSIS — R1084 Generalized abdominal pain: Secondary | ICD-10-CM | POA: Diagnosis not present

## 2021-12-08 DIAGNOSIS — Z0000 Encounter for general adult medical examination without abnormal findings: Secondary | ICD-10-CM | POA: Diagnosis not present

## 2021-12-08 DIAGNOSIS — Z299 Encounter for prophylactic measures, unspecified: Secondary | ICD-10-CM | POA: Diagnosis not present

## 2021-12-08 DIAGNOSIS — E559 Vitamin D deficiency, unspecified: Secondary | ICD-10-CM | POA: Diagnosis not present

## 2021-12-08 DIAGNOSIS — R5383 Other fatigue: Secondary | ICD-10-CM | POA: Diagnosis not present

## 2021-12-08 DIAGNOSIS — Z1331 Encounter for screening for depression: Secondary | ICD-10-CM | POA: Diagnosis not present

## 2021-12-08 DIAGNOSIS — Z79899 Other long term (current) drug therapy: Secondary | ICD-10-CM | POA: Diagnosis not present

## 2021-12-08 DIAGNOSIS — F0390 Unspecified dementia without behavioral disturbance: Secondary | ICD-10-CM | POA: Diagnosis not present

## 2021-12-08 DIAGNOSIS — Z6823 Body mass index (BMI) 23.0-23.9, adult: Secondary | ICD-10-CM | POA: Diagnosis not present

## 2021-12-08 DIAGNOSIS — E1165 Type 2 diabetes mellitus with hyperglycemia: Secondary | ICD-10-CM | POA: Diagnosis not present

## 2021-12-08 DIAGNOSIS — Z7189 Other specified counseling: Secondary | ICD-10-CM | POA: Diagnosis not present

## 2021-12-08 DIAGNOSIS — Z1339 Encounter for screening examination for other mental health and behavioral disorders: Secondary | ICD-10-CM | POA: Diagnosis not present

## 2021-12-08 DIAGNOSIS — I739 Peripheral vascular disease, unspecified: Secondary | ICD-10-CM | POA: Diagnosis not present

## 2021-12-08 DIAGNOSIS — E7800 Pure hypercholesterolemia, unspecified: Secondary | ICD-10-CM | POA: Diagnosis not present

## 2021-12-17 DIAGNOSIS — I10 Essential (primary) hypertension: Secondary | ICD-10-CM | POA: Diagnosis not present

## 2021-12-24 ENCOUNTER — Ambulatory Visit: Payer: BC Managed Care – PPO | Admitting: Cardiology

## 2021-12-25 DIAGNOSIS — Z299 Encounter for prophylactic measures, unspecified: Secondary | ICD-10-CM | POA: Diagnosis not present

## 2021-12-25 DIAGNOSIS — E1165 Type 2 diabetes mellitus with hyperglycemia: Secondary | ICD-10-CM | POA: Diagnosis not present

## 2021-12-25 DIAGNOSIS — G47411 Narcolepsy with cataplexy: Secondary | ICD-10-CM | POA: Diagnosis not present

## 2021-12-25 DIAGNOSIS — I10 Essential (primary) hypertension: Secondary | ICD-10-CM | POA: Diagnosis not present

## 2021-12-25 DIAGNOSIS — Z6823 Body mass index (BMI) 23.0-23.9, adult: Secondary | ICD-10-CM | POA: Diagnosis not present

## 2021-12-25 DIAGNOSIS — D751 Secondary polycythemia: Secondary | ICD-10-CM | POA: Diagnosis not present

## 2021-12-28 DIAGNOSIS — E2839 Other primary ovarian failure: Secondary | ICD-10-CM | POA: Diagnosis not present

## 2022-03-09 ENCOUNTER — Ambulatory Visit: Payer: BC Managed Care – PPO | Admitting: Neurology

## 2022-03-12 DIAGNOSIS — E1165 Type 2 diabetes mellitus with hyperglycemia: Secondary | ICD-10-CM | POA: Diagnosis not present

## 2022-03-12 DIAGNOSIS — Z713 Dietary counseling and surveillance: Secondary | ICD-10-CM | POA: Diagnosis not present

## 2022-03-12 DIAGNOSIS — Z299 Encounter for prophylactic measures, unspecified: Secondary | ICD-10-CM | POA: Diagnosis not present

## 2022-03-12 DIAGNOSIS — I10 Essential (primary) hypertension: Secondary | ICD-10-CM | POA: Diagnosis not present

## 2022-03-12 DIAGNOSIS — Z6822 Body mass index (BMI) 22.0-22.9, adult: Secondary | ICD-10-CM | POA: Diagnosis not present

## 2022-04-13 ENCOUNTER — Ambulatory Visit: Payer: BC Managed Care – PPO | Admitting: Cardiology

## 2022-05-24 DIAGNOSIS — Z79899 Other long term (current) drug therapy: Secondary | ICD-10-CM | POA: Diagnosis not present

## 2022-05-24 DIAGNOSIS — F0390 Unspecified dementia without behavioral disturbance: Secondary | ICD-10-CM | POA: Diagnosis not present

## 2022-05-24 DIAGNOSIS — Z5181 Encounter for therapeutic drug level monitoring: Secondary | ICD-10-CM | POA: Diagnosis not present

## 2022-05-24 DIAGNOSIS — I781 Nevus, non-neoplastic: Secondary | ICD-10-CM | POA: Diagnosis not present

## 2022-05-24 DIAGNOSIS — M2550 Pain in unspecified joint: Secondary | ICD-10-CM | POA: Diagnosis not present

## 2022-05-24 DIAGNOSIS — I10 Essential (primary) hypertension: Secondary | ICD-10-CM | POA: Diagnosis not present

## 2022-05-24 DIAGNOSIS — L409 Psoriasis, unspecified: Secondary | ICD-10-CM | POA: Diagnosis not present

## 2022-06-16 DIAGNOSIS — Z299 Encounter for prophylactic measures, unspecified: Secondary | ICD-10-CM | POA: Diagnosis not present

## 2022-06-16 DIAGNOSIS — I10 Essential (primary) hypertension: Secondary | ICD-10-CM | POA: Diagnosis not present

## 2022-06-16 DIAGNOSIS — E1165 Type 2 diabetes mellitus with hyperglycemia: Secondary | ICD-10-CM | POA: Diagnosis not present

## 2022-06-16 DIAGNOSIS — R609 Edema, unspecified: Secondary | ICD-10-CM | POA: Diagnosis not present

## 2022-06-16 DIAGNOSIS — Z6824 Body mass index (BMI) 24.0-24.9, adult: Secondary | ICD-10-CM | POA: Diagnosis not present

## 2022-08-27 ENCOUNTER — Ambulatory Visit: Payer: Medicare Other | Attending: Nurse Practitioner | Admitting: Nurse Practitioner

## 2022-08-27 ENCOUNTER — Encounter: Payer: Self-pay | Admitting: Nurse Practitioner

## 2022-08-27 VITALS — BP 138/60 | HR 62 | Ht 60.0 in | Wt 118.6 lb

## 2022-08-27 DIAGNOSIS — I5181 Takotsubo syndrome: Secondary | ICD-10-CM

## 2022-08-27 DIAGNOSIS — I2510 Atherosclerotic heart disease of native coronary artery without angina pectoris: Secondary | ICD-10-CM

## 2022-08-27 DIAGNOSIS — I10 Essential (primary) hypertension: Secondary | ICD-10-CM | POA: Diagnosis not present

## 2022-08-27 DIAGNOSIS — R0989 Other specified symptoms and signs involving the circulatory and respiratory systems: Secondary | ICD-10-CM

## 2022-08-27 DIAGNOSIS — F0280 Dementia in other diseases classified elsewhere without behavioral disturbance: Secondary | ICD-10-CM | POA: Diagnosis not present

## 2022-08-27 DIAGNOSIS — G309 Alzheimer's disease, unspecified: Secondary | ICD-10-CM | POA: Diagnosis not present

## 2022-08-27 DIAGNOSIS — E785 Hyperlipidemia, unspecified: Secondary | ICD-10-CM | POA: Diagnosis not present

## 2022-08-27 DIAGNOSIS — R011 Cardiac murmur, unspecified: Secondary | ICD-10-CM | POA: Diagnosis not present

## 2022-08-27 NOTE — Patient Instructions (Signed)

## 2022-08-27 NOTE — Progress Notes (Signed)
Office Visit    Patient Name: Jennifer [name] Date of Encounter: 08/27/2022  PCP:  Jennifer [name], [facility]  Cardiologist:  Jennifer Lesches, MD  Advanced Practice Provider:  Finis Bud, NP Electrophysiologist:  None   Chief Complaint    Jennifer [name] is a 82 y.o. female with a hx of CAD, hyperlipidemia, hypertension, type 2 diabetes, history of Takotsubo cardiomyopathy, psoriatic arthritis, and Alzheimer's dementia, who presents today for 6 month follow-up.   Past Medical History    Past Medical History:  Diagnosis Date   Alzheimer's dementia ([place])    Dr Jennifer [name] in [place] diabetes    Coronary atherosclerosis of native coronary artery    Nonobstructive at catheterization June 2014   DDD (degenerative disc disease), lumbar    Depression    Essential hypertension    Hyperlipidemia    Nephrolithiasis    Psoriatic arthritis ([place])    Takotsubo cardiomyopathy    LVEF 60-65% September 2014   Past Surgical History:  Procedure Laterality Date   APPENDECTOMY     BACK SURGERY  2014   CHOLECYSTECTOMY     LEFT HEART CATHETERIZATION WITH CORONARY ANGIOGRAM N/A 12/18/2012   Procedure: LEFT HEART CATHETERIZATION WITH CORONARY ANGIOGRAM;  Surgeon: Sherren Mocha, MD;  Location: [facility] CATH LAB;  Service: Cardiovascular;  Laterality: N/A;    Allergies  Allergies  Allergen Reactions   Cyclobenzaprine     TOO STRONG  CAN'T USE AND WORK    History of Present Illness    Jennifer [name] is a 82 y.o. female with a PMH as mentioned above.   Last seen by Dr. Johney [name] on July 20, 2021.  Blood pressure was elevated at that time.  SBP running 140-150s.  Was overall doing well from a cardiac perspective.  Losartan was uptitrated to 25 mg daily.  Today she presents for overdue 60-monthfollow-up.  She presents today with her [name], who is her historian.  Patient is unable to give me her history due to Alzheimer's  dementia.  [name] has noticed her blood pressure has been elevated today.  He is unable to tell me recent blood pressure readings.  Overall doing well from a cardiac perspective. Denies any chest pain, shortness of breath, palpitations, syncope, presyncope, dizziness, orthopnea, PND, swelling or significant weight changes, acute bleeding, or claudication.  EKGs/Labs/Other Studies Reviewed:   The following studies were reviewed today:   EKG:  EKG is ordered today.  The ekg ordered today demonstrates sinus rhythm, 64 bpm, LVH with repolarization abnormality, nonspecific ST/T wave abnormality, no acute ischemic changes.  Cardiac monitor on October 11, 2019: Predominant rhythm is sinus. Heart rate ranged from 48 bpm up to 88 bpm with average heart rate 59 bpm. Only rare PACs and PVCs were noted representing less than 1% of total beats. There were no sustained arrhythmias or pauses.  2D echocardiogram on August 23, 2019: 1. Left ventricular ejection fraction, by estimation, is 60 to 65%. The  left ventricle has normal function. The left ventricle has no regional  wall motion abnormalities. Left ventricular diastolic parameters are  consistent with Grade I diastolic  dysfunction (impaired relaxation).   2. Right ventricular systolic function is normal. The right ventricular  size is normal.   3. The mitral valve is normal in structure and function. No evidence of  mitral valve regurgitation. No evidence of mitral stenosis.   4. The aortic valve is tricuspid. Aortic  valve regurgitation is not  visualized. No aortic stenosis is present.   5. The inferior vena cava IVC is small, suggesting low RA pressure.  Recent Labs: No results found for requested labs within last 365 days.  Recent Lipid Panel    Component Value Date/Time   CHOL 152 12/16/2012 0520   TRIG 34 12/16/2012 0520   HDL 57 12/16/2012 0520   CHOLHDL 2.7 12/16/2012 0520   VLDL 7 12/16/2012 0520   LDLCALC 88 12/16/2012 0520     Home Medications   Current Meds  Medication Sig   amLODipine (NORVASC) 10 MG tablet Take 10 mg by mouth daily.   atorvastatin (LIPITOR) 10 MG tablet TAKE 1 TABLET AT 6:00 P.M. (Patient taking differently: Take 10 mg by mouth every evening.)   calcium carbonate (OSCAL) 1500 (600 Ca) MG TABS tablet Take 600 mg of elemental calcium by mouth 2 (two) times daily with a meal.   clobetasol cream (TEMOVATE) AB-[id number] % Apply 1 application topically 2 (two) times daily as needed (for eczema).    Guselkumab (TREMFYA) 100 MG/ML SOPN Inject into the skin. Every 56 days   losartan (COZAAR) 25 MG tablet Take 25 mg by mouth daily.   memantine (NAMENDA XR) 28 MG CP24 24 hr capsule Take 1 capsule (28 mg total) by mouth daily. (Patient taking differently: Take 28 mg by mouth every evening.)   PARoxetine (PAXIL) 40 MG tablet Take 40 mg by mouth every evening.    Senna (SENOKOT LAXATIVE GUMMIES PO) Take 3 tablets by mouth daily.   [DISCONTINUED] losartan (COZAAR) 25 MG tablet Take 0.5 tablets (12.5 mg total) by mouth daily. (Patient taking differently: Take 25 mg by mouth daily.)     Review of Systems    All other systems reviewed and are otherwise negative except as noted above.  Physical Exam    VS:  BP 138/60 (BP Location: Left Arm, Patient Position: Sitting, Cuff Size: Normal)   Pulse 62   Ht 5' (1.524 m)   Wt 118 lb 9.6 oz (53.8 kg)   SpO2 92%   BMI 23.16 kg/m  , BMI Body mass index is 23.16 kg/m.  Wt Readings from Last 3 Encounters:  08/27/22 118 lb 9.6 oz (53.8 kg)  07/20/21 121 lb 3.2 oz (55 kg)  03/09/21 128 lb 12.8 oz (58.4 kg)     GEN: Well nourished, well developed, in no acute distress. HEENT: normal. Neck: Supple, no JVD, L carotid bruit, no right carotid bruit, no masses. Cardiac: S1/S2, RRR, Grade 1/6 murmur, no rubs, no gallops. No clubbing, cyanosis, edema.  Radials/PT 2+ and equal bilaterally.  Respiratory:  Respirations regular and unlabored, clear to auscultation  bilaterally. MS: No deformity or atrophy. Skin: Warm and dry, no rash. Neuro:  Strength and sensation are intact. Psych: Normal affect, nonverbal, smiling  Assessment & Plan    CAD Stable with no anginal symptoms. No indication for ischemic evaluation.  Continue amlodipine and losartan.  Will request labs drawn by PCP.  If kidney function permits, will increase losartan to 50 mg daily and recheck BMET in 2 weeks per protocol - see below.  No medication changes at this time.   HLD Lipid panel from June 2023 was WNL.  Continue atorvastatin.   HTN Blood pressure on arrival 144/62, repeat BP 138/60. Will request labs drawn by PCP.  If kidney function permits, will increase losartan to 50 mg daily and recheck BMET in 2 weeks per protocol - see below.  No medication changes  at this time.   Hx of Takotsubo CM, murmur, left carotid bruit TTE in 2021 revealed normal EF.  Grade 1/6 murmur noted on exam with mild, soft bruit noted along left carotid.  Family member states patient may be uncomfortable to have echocardiogram and carotid Doppler performed due to Alzheimer's dementia.  Will continue to monitor for now.  Continue current medication regimen.  Alzheimer's dementia Family member does admit to poor p.o. intake with meals and with liquids.  Recommended to discuss goals of care with Dr. Brigitte Pulse.  May need to consider palliative care referral in the future.  Continue Namenda.  Continue follow-up with PCP.  Disposition: Follow up in 6 month(s) with Jennifer Lesches, MD or APP via telephone visit.  Signed, Jennifer Bud, NP 08/27/2022, 3:51 PM [facility]

## 2022-09-08 ENCOUNTER — Telehealth: Payer: Self-pay | Admitting: Nurse Practitioner

## 2022-09-08 DIAGNOSIS — Z79899 Other long term (current) drug therapy: Secondary | ICD-10-CM

## 2022-09-08 DIAGNOSIS — I10 Essential (primary) hypertension: Secondary | ICD-10-CM

## 2022-09-08 NOTE — Telephone Encounter (Signed)
Pt's brother dropped off labs from PCP to check for kidney function for BP medication change

## 2022-09-09 MED ORDER — LOSARTAN POTASSIUM 50 MG PO TABS: 50.0000 mg | ORAL_TABLET | Freq: Every day | ORAL | 6 refills | Status: DC

## 2022-09-09 NOTE — Addendum Note (Signed)
Addended by: Merlene Laughter on: 09/09/2022 08:27 AM   Modules accepted: Orders

## 2022-09-09 NOTE — Telephone Encounter (Signed)
Noted  

## 2022-09-09 NOTE — Telephone Encounter (Signed)
Patient's brother Francee Piccolo informed and verbalized understanding of plan. Lab order faxed to [facility]

## 2022-09-20 DIAGNOSIS — Z299 Encounter for prophylactic measures, unspecified: Secondary | ICD-10-CM | POA: Diagnosis not present

## 2022-09-20 DIAGNOSIS — E7800 Pure hypercholesterolemia, unspecified: Secondary | ICD-10-CM | POA: Diagnosis not present

## 2022-09-20 DIAGNOSIS — E1165 Type 2 diabetes mellitus with hyperglycemia: Secondary | ICD-10-CM | POA: Diagnosis not present

## 2022-09-20 DIAGNOSIS — I739 Peripheral vascular disease, unspecified: Secondary | ICD-10-CM | POA: Diagnosis not present

## 2022-09-20 DIAGNOSIS — I10 Essential (primary) hypertension: Secondary | ICD-10-CM | POA: Diagnosis not present

## 2022-09-20 DIAGNOSIS — L4050 Arthropathic psoriasis, unspecified: Secondary | ICD-10-CM | POA: Diagnosis not present

## 2022-09-20 DIAGNOSIS — Z6823 Body mass index (BMI) 23.0-23.9, adult: Secondary | ICD-10-CM | POA: Diagnosis not present

## 2022-09-23 DIAGNOSIS — I10 Essential (primary) hypertension: Secondary | ICD-10-CM | POA: Diagnosis not present

## 2022-09-23 DIAGNOSIS — Z79899 Other long term (current) drug therapy: Secondary | ICD-10-CM | POA: Diagnosis not present

## 2022-12-29 DIAGNOSIS — Z789 Other specified health status: Secondary | ICD-10-CM | POA: Diagnosis not present

## 2022-12-29 DIAGNOSIS — Z79899 Other long term (current) drug therapy: Secondary | ICD-10-CM | POA: Diagnosis not present

## 2022-12-29 DIAGNOSIS — Z1331 Encounter for screening for depression: Secondary | ICD-10-CM | POA: Diagnosis not present

## 2022-12-29 DIAGNOSIS — Z299 Encounter for prophylactic measures, unspecified: Secondary | ICD-10-CM | POA: Diagnosis not present

## 2022-12-29 DIAGNOSIS — Z0000 Encounter for general adult medical examination without abnormal findings: Secondary | ICD-10-CM | POA: Diagnosis not present

## 2022-12-29 DIAGNOSIS — Z7189 Other specified counseling: Secondary | ICD-10-CM | POA: Diagnosis not present

## 2022-12-29 DIAGNOSIS — R5383 Other fatigue: Secondary | ICD-10-CM | POA: Diagnosis not present

## 2022-12-29 DIAGNOSIS — Z1339 Encounter for screening examination for other mental health and behavioral disorders: Secondary | ICD-10-CM | POA: Diagnosis not present

## 2022-12-29 DIAGNOSIS — E7800 Pure hypercholesterolemia, unspecified: Secondary | ICD-10-CM | POA: Diagnosis not present

## 2023-02-18 ENCOUNTER — Encounter: Payer: Self-pay | Admitting: Cardiology

## 2023-02-18 ENCOUNTER — Ambulatory Visit: Payer: Medicare Other | Attending: Cardiology | Admitting: Cardiology

## 2023-02-18 VITALS — BP 138/62 | HR 56 | Ht 60.0 in | Wt 114.0 lb

## 2023-02-18 DIAGNOSIS — I5181 Takotsubo syndrome: Secondary | ICD-10-CM

## 2023-02-18 DIAGNOSIS — I10 Essential (primary) hypertension: Secondary | ICD-10-CM | POA: Diagnosis not present

## 2023-02-18 NOTE — Progress Notes (Signed)
   Virtual Visit via Telephone Note   Because of Jennifer [name]'s co-morbid illnesses, she is at least at moderate risk for complications without adequate follow up.  This format is felt to be most appropriate for this patient at this time.  The patient did not have access to video technology/had technical difficulties with video requiring transitioning to audio format only (telephone).  All issues noted in this document were discussed and addressed.  No physical exam could be performed with this format.  Please refer to the patient's chart for her consent to telehealth for [facility].    Date:  02/18/2023   ID:  Jennifer [name], DOB April 17, 1941, MRN [id number] The patient was identified using 2 identifiers.  Patient Location: Home Provider Location: Office/Clinic  PCP:  Kirstie Peri, MD   Chief Complaint: Cardiac follow-up  History of Present Illness:    Jennifer [name] is an 82 y.o. female last seen in March by Ms. Philis Nettle NP, I reviewed the note.  Today I spoke with her brother and primary caregiver Mr. Hollice Espy by phone.  He lives with Ms. Vallese and takes care of her regularly.  He states that she sleeps a lot, is eating 1 meal a day at this time.  Has some days when she is more interactive and will go on a car ride with him, but generally does not get out of the house very much.  I reviewed her medications which are stable.  Mr. Hollice Espy tells me that she sometimes does not take them each day.  I reviewed today's blood pressure as noted below and her last lab work from April.  Labs/Other Tests and Data Reviewed:    EKG:  An ECG dated 08/27/2022 was personally reviewed today and demonstrated:  Sinus rhythm with LVH and IVCD, left anterior fascicular block, nonspecific ST changes.  Recent Labs:  April 2024: Potassium 3.5, BUN 23, creatinine 0.71  Wt Readings from Last 3 Encounters:  02/18/23 114 lb (51.7 kg)  08/27/22 118 lb 9.6 oz (53.8 kg)  07/20/21 121 lb 3.2 oz (55  kg)        Objective:    Vital Signs:  BP 138/62 (BP Location: Left Arm, Patient Position: Sitting)   Pulse (!) 56   Ht 5' (1.524 m)   Wt 114 lb (51.7 kg)   BMI 22.26 kg/m    ASSESSMENT & PLAN:    1.  History of stress-induced cardiomyopathy with normalization of LVEF.  Echocardiogram in 2021 revealed LVEF 60 to 65% without regional wall motion abnormalities.  No obvious recurring symptoms at low level activity and in the setting of progressive dementia.  No clear indication for reimaging at this time.  2.  Essential hypertension.  Continue Norvasc and Cozaar at current doses.  3.  History of nonobstructive CAD at cardiac catheterization in 2014.  4.  Mixed hyperlipidemia.  LDL 71 in June 2023.  She is on Lipitor.   Time:   Today, I have spent 10 minutes with the patient with telehealth technology discussing the above problems.     Follow Up:  Virtual Visit   6 months.  Signed, Nona Dell, MD  02/18/2023 1:48 PM    [facility]

## 2023-02-18 NOTE — Progress Notes (Signed)
  Patient Consent for Virtual Visit  Jennifer [name] has provided verbal consent on 02/18/2023 for a virtual visit (video or telephone).   CONSENT FOR VIRTUAL VISIT FOR:  Jennifer [name]  By participating in this virtual visit I agree to the following:  I hereby voluntarily request, consent and authorize [facility] and its employed or contracted physicians, physician assistants, nurse practitioners or other licensed health care professionals (the Practitioner), to provide me with telemedicine health care services (the "Services") as deemed necessary by the treating Practitioner. I acknowledge and consent to receive the Services by the Practitioner via telemedicine. I understand that the telemedicine visit will involve communicating with the Practitioner through live audiovisual communication technology and the disclosure of certain medical information by electronic transmission. I acknowledge that I have been given the opportunity to request an in-person assessment or other available alternative prior to the telemedicine visit and am voluntarily participating in the telemedicine visit.  I understand that I have the right to withhold or withdraw my consent to the use of telemedicine in the course of my care at any time, without affecting my right to future care or treatment, and that the Practitioner or I may terminate the telemedicine visit at any time. I understand that I have the right to inspect all information obtained and/or recorded in the course of the telemedicine visit and may receive copies of available information for a reasonable fee.  I understand that some of the potential risks of receiving the Services via telemedicine include:  Delay or interruption in medical evaluation due to technological equipment failure or disruption; Information transmitted may not be sufficient (e.g. poor resolution of images) to allow for appropriate medical decision making by the Practitioner;  and/or  In rare instances, security protocols could fail, causing a breach of personal health information.  Furthermore, I acknowledge that it is my responsibility to provide information about my medical history, conditions and care that is complete and accurate to the best of my ability. I acknowledge that Practitioner's advice, recommendations, and/or decision may be based on factors not within their control, such as incomplete or inaccurate data provided by me or distortions of diagnostic images or specimens that may result from electronic transmissions. I understand that the practice of medicine is not an exact science and that Practitioner makes no warranties or guarantees regarding treatment outcomes. I acknowledge that a copy of this consent can be made available to me via my patient portal [facility] MyChart), or I can request a printed copy by calling the office of [facility].    I understand that my insurance will be billed for this visit.   I have read or had this consent read to me. I understand the contents of this consent, which adequately explains the benefits and risks of the Services being provided via telemedicine.  I have been provided ample opportunity to ask questions regarding this consent and the Services and have had my questions answered to my satisfaction. I give my informed consent for the services to be provided through the use of telemedicine in my medical care

## 2023-02-18 NOTE — Patient Instructions (Signed)
Medication Instructions:  Your physician recommends that you continue on your current medications as directed. Please refer to the Current Medication list given to you today.   Labwork: None today  Testing/Procedures: None today  Follow-Up: 6 months Virtual Visit (phone visit)  Any Other Special Instructions Will Be Listed Below (If Applicable).  If you need a refill on your cardiac medications before your next appointment, please call your pharmacy.

## 2023-03-11 DIAGNOSIS — L409 Psoriasis, unspecified: Secondary | ICD-10-CM | POA: Diagnosis not present

## 2023-05-10 ENCOUNTER — Other Ambulatory Visit: Payer: Self-pay | Admitting: Nurse Practitioner

## 2023-05-26 DIAGNOSIS — R569 Unspecified convulsions: Secondary | ICD-10-CM | POA: Diagnosis not present

## 2023-05-26 DIAGNOSIS — Z299 Encounter for prophylactic measures, unspecified: Secondary | ICD-10-CM | POA: Diagnosis not present

## 2023-05-26 DIAGNOSIS — L4050 Arthropathic psoriasis, unspecified: Secondary | ICD-10-CM | POA: Diagnosis not present

## 2023-05-26 DIAGNOSIS — I10 Essential (primary) hypertension: Secondary | ICD-10-CM | POA: Diagnosis not present

## 2023-05-26 DIAGNOSIS — F0390 Unspecified dementia without behavioral disturbance: Secondary | ICD-10-CM | POA: Diagnosis not present

## 2023-05-26 DIAGNOSIS — E1169 Type 2 diabetes mellitus with other specified complication: Secondary | ICD-10-CM | POA: Diagnosis not present

## 2023-08-31 ENCOUNTER — Ambulatory Visit: Payer: Medicare Other | Attending: Cardiology | Admitting: Cardiology

## 2023-08-31 ENCOUNTER — Telehealth: Payer: Self-pay

## 2023-08-31 ENCOUNTER — Encounter: Payer: Self-pay | Admitting: Cardiology

## 2023-08-31 VITALS — BP 130/61 | HR 64 | Ht 60.0 in | Wt 97.0 lb

## 2023-08-31 DIAGNOSIS — I5181 Takotsubo syndrome: Secondary | ICD-10-CM | POA: Insufficient documentation

## 2023-08-31 DIAGNOSIS — I10 Essential (primary) hypertension: Secondary | ICD-10-CM | POA: Diagnosis not present

## 2023-08-31 NOTE — Patient Instructions (Addendum)
 Medication Instructions:  Your physician recommends that you continue on your current medications as directed. Please refer to the Current Medication list given to you today.  Labwork: none  Testing/Procedures: none  Follow-Up: Your physician recommends that you schedule a follow-up appointment in: 6 months (phone visit)  Any Other Special Instructions Will Be Listed Below (If Applicable).  If you need a refill on your cardiac medications before your next appointment, please call your pharmacy.

## 2023-08-31 NOTE — Progress Notes (Signed)
   Virtual Visit via Telephone Note   Because of Jennifer [name]'s co-morbid illnesses, she is at least at moderate risk for complications without adequate follow up.  This format is felt to be most appropriate for this patient at this time.  The patient did not have access to video technology/had technical difficulties with video requiring transitioning to audio format only (telephone).  All issues noted in this document were discussed and addressed.  No physical exam could be performed with this format.  Please refer to the patient's chart for her consent to telehealth for [facility].    Date:  08/31/2023   ID:  Jennifer [name], DOB Feb 11, 1941, MRN [id number] The patient was identified using 2 identifiers.  Patient Location: Home Provider Location: Office/Clinic  Evaluation Performed:  Follow-Up Visit  Chief Complaint: Cardiac follow-up  History of Present Illness:    Jennifer [name] is an 83 y.o. female last assessed via telehealth encounter in August 2024.  I spoke with her brother and primary caregiver Mr. Hollice Espy by phone today.  He tells me that Jennifer [name] has good days and bad days, sleeps a lot, has had a limited appetite with progressive dementia.  She has lost weight over the last year.  She is able to take her medications and he tracks her blood pressure.  Labs/Other Tests and Data Reviewed:    EKG:  An ECG dated 08/27/2022 was personally reviewed today and demonstrated:  Sinus rhythm with LVH and IVCD, left anterior fascicular block, nonspecific ST changes.  Recent Labs:  April 2024: Potassium 3.5, BUN 23, creatinine 0.71  Wt Readings from Last 3 Encounters:  08/31/23 97 lb (44 kg)  02/18/23 114 lb (51.7 kg)  08/27/22 118 lb 9.6 oz (53.8 kg)        Objective:    Vital Signs:  BP 130/61   Pulse 64   Ht 5' (1.524 m)   Wt 97 lb (44 kg)   BMI 18.94 kg/m    ASSESSMENT & PLAN:    1.  History of stress-induced cardiomyopathy with normalization of LVEF.   Echocardiogram in 2021 revealed LVEF 60 to 65% without regional wall motion abnormalities.  No additional imaging studies are planned at this time.   2.  Primary hypertension.  Continue Norvasc 10 mg daily and Cozaar 50 mg daily.   3.  History of nonobstructive CAD at cardiac catheterization in 2014.   4.  Mixed hyperlipidemia.  Continue Lipitor 10 mg daily.  Time:   Today, I have spent 8 minutes with the patient with telehealth technology discussing the above problems.     Follow Up:  Virtual Visit   6 months.  Signed, Nona Dell, MD  08/31/2023 2:30 PM    [facility]

## 2023-08-31 NOTE — Telephone Encounter (Signed)
  Patient Consent for Virtual Visit        Jennifer [name] has provided verbal consent on 08/31/2023 for a virtual visit (video or telephone).   CONSENT FOR VIRTUAL VISIT FOR:  Jennifer [name]  By participating in this virtual visit I agree to the following:  I hereby voluntarily request, consent and authorize [facility] and its employed or contracted physicians, physician assistants, nurse practitioners or other licensed health care professionals (the Practitioner), to provide me with telemedicine health care services (the "Services") as deemed necessary by the treating Practitioner. I acknowledge and consent to receive the Services by the Practitioner via telemedicine. I understand that the telemedicine visit will involve communicating with the Practitioner through live audiovisual communication technology and the disclosure of certain medical information by electronic transmission. I acknowledge that I have been given the opportunity to request an in-person assessment or other available alternative prior to the telemedicine visit and am voluntarily participating in the telemedicine visit.  I understand that I have the right to withhold or withdraw my consent to the use of telemedicine in the course of my care at any time, without affecting my right to future care or treatment, and that the Practitioner or I may terminate the telemedicine visit at any time. I understand that I have the right to inspect all information obtained and/or recorded in the course of the telemedicine visit and may receive copies of available information for a reasonable fee.  I understand that some of the potential risks of receiving the Services via telemedicine include:  Delay or interruption in medical evaluation due to technological equipment failure or disruption; Information transmitted may not be sufficient (e.g. poor resolution of images) to allow for appropriate medical decision making by the  Practitioner; and/or  In rare instances, security protocols could fail, causing a breach of personal health information.  Furthermore, I acknowledge that it is my responsibility to provide information about my medical history, conditions and care that is complete and accurate to the best of my ability. I acknowledge that Practitioner's advice, recommendations, and/or decision may be based on factors not within their control, such as incomplete or inaccurate data provided by me or distortions of diagnostic images or specimens that may result from electronic transmissions. I understand that the practice of medicine is not an exact science and that Practitioner makes no warranties or guarantees regarding treatment outcomes. I acknowledge that a copy of this consent can be made available to me via my patient portal [facility] MyChart), or I can request a printed copy by calling the office of [facility].    I understand that my insurance will be billed for this visit.   I have read or had this consent read to me. I understand the contents of this consent, which adequately explains the benefits and risks of the Services being provided via telemedicine.  I have been provided ample opportunity to ask questions regarding this consent and the Services and have had my questions answered to my satisfaction. I give my informed consent for the services to be provided through the use of telemedicine in my medical care

## 2024-02-06 ENCOUNTER — Telehealth: Payer: Self-pay | Admitting: Cardiology

## 2024-02-06 ENCOUNTER — Telehealth: Payer: Self-pay | Admitting: Nurse Practitioner

## 2024-02-06 NOTE — Telephone Encounter (Signed)
  Patient Consent for Virtual Visit        Jennifer [name] has provided verbal consent on 02/06/2024 for a virtual visit (video or telephone).   CONSENT FOR VIRTUAL VISIT FOR:  Jennifer [name]  By participating in this virtual visit I agree to the following:  I hereby voluntarily request, consent and authorize [facility] and its employed or contracted physicians, physician assistants, nurse practitioners or other licensed health care professionals (the Practitioner), to provide me with telemedicine health care services (the "Services) as deemed necessary by the treating Practitioner. I acknowledge and consent to receive the Services by the Practitioner via telemedicine. I understand that the telemedicine visit will involve communicating with the Practitioner through live audiovisual communication technology and the disclosure of certain medical information by electronic transmission. I acknowledge that I have been given the opportunity to request an in-person assessment or other available alternative prior to the telemedicine visit and am voluntarily participating in the telemedicine visit.  I understand that I have the right to withhold or withdraw my consent to the use of telemedicine in the course of my care at any time, without affecting my right to future care or treatment, and that the Practitioner or I may terminate the telemedicine visit at any time. I understand that I have the right to inspect all information obtained and/or recorded in the course of the telemedicine visit and may receive copies of available information for a reasonable fee.  I understand that some of the potential risks of receiving the Services via telemedicine include:  Delay or interruption in medical evaluation due to technological equipment failure or disruption; Information transmitted may not be sufficient (e.g. poor resolution of images) to allow for appropriate medical decision making by the  Practitioner; and/or  In rare instances, security protocols could fail, causing a breach of personal health information.  Furthermore, I acknowledge that it is my responsibility to provide information about my medical history, conditions and care that is complete and accurate to the best of my ability. I acknowledge that Practitioner's advice, recommendations, and/or decision may be based on factors not within their control, such as incomplete or inaccurate data provided by me or distortions of diagnostic images or specimens that may result from electronic transmissions. I understand that the practice of medicine is not an exact science and that Practitioner makes no warranties or guarantees regarding treatment outcomes. I acknowledge that a copy of this consent can be made available to me via my patient portal [facility] MyChart), or I can request a printed copy by calling the office of [facility].    I understand that my insurance will be billed for this visit.   I have read or had this consent read to me. I understand the contents of this consent, which adequately explains the benefits and risks of the Services being provided via telemedicine.  I have been provided ample opportunity to ask questions regarding this consent and the Services and have had my questions answered to my satisfaction. I give my informed consent for the services to be provided through the use of telemedicine in my medical care

## 2024-02-06 NOTE — Telephone Encounter (Signed)
 Recall is in for September.

## 2024-02-06 NOTE — Telephone Encounter (Signed)
 Brother Chief Operating Officer) called follow-up on re-scheduling a telephone visit.

## 2024-03-09 DIAGNOSIS — L409 Psoriasis, unspecified: Secondary | ICD-10-CM | POA: Diagnosis not present

## 2024-03-27 DIAGNOSIS — E7800 Pure hypercholesterolemia, unspecified: Secondary | ICD-10-CM | POA: Diagnosis not present

## 2024-03-27 DIAGNOSIS — Z79899 Other long term (current) drug therapy: Secondary | ICD-10-CM | POA: Diagnosis not present

## 2024-03-27 DIAGNOSIS — R5383 Other fatigue: Secondary | ICD-10-CM | POA: Diagnosis not present

## 2024-03-27 DIAGNOSIS — Z1339 Encounter for screening examination for other mental health and behavioral disorders: Secondary | ICD-10-CM | POA: Diagnosis not present

## 2024-03-27 DIAGNOSIS — Z0000 Encounter for general adult medical examination without abnormal findings: Secondary | ICD-10-CM | POA: Diagnosis not present

## 2024-03-27 DIAGNOSIS — Z7189 Other specified counseling: Secondary | ICD-10-CM | POA: Diagnosis not present

## 2024-03-27 DIAGNOSIS — Z299 Encounter for prophylactic measures, unspecified: Secondary | ICD-10-CM | POA: Diagnosis not present

## 2024-03-27 DIAGNOSIS — Z1331 Encounter for screening for depression: Secondary | ICD-10-CM | POA: Diagnosis not present

## 2024-04-02 ENCOUNTER — Ambulatory Visit: Admitting: Nurse Practitioner

## 2024-04-13 ENCOUNTER — Other Ambulatory Visit: Payer: Self-pay | Admitting: Nurse Practitioner
